# Patient Record
Sex: Male | Born: 1970 | Race: White | Hispanic: No | Marital: Married | Smoking: Never smoker
Health system: Southern US, Community
[De-identification: ages and names within clinical notes are randomized; demographics above are authoritative.]

## PROBLEM LIST (undated history)

## (undated) DIAGNOSIS — F319 Bipolar disorder, unspecified: Secondary | ICD-10-CM

## (undated) DIAGNOSIS — F429 Obsessive-compulsive disorder, unspecified: Secondary | ICD-10-CM

## (undated) DIAGNOSIS — F329 Major depressive disorder, single episode, unspecified: Secondary | ICD-10-CM

## (undated) DIAGNOSIS — I10 Essential (primary) hypertension: Secondary | ICD-10-CM

## (undated) DIAGNOSIS — K819 Cholecystitis, unspecified: Secondary | ICD-10-CM

## (undated) DIAGNOSIS — F331 Major depressive disorder, recurrent, moderate: Secondary | ICD-10-CM

## (undated) DIAGNOSIS — F419 Anxiety disorder, unspecified: Secondary | ICD-10-CM

## (undated) DIAGNOSIS — G4700 Insomnia, unspecified: Secondary | ICD-10-CM

## (undated) HISTORY — DX: Cholecystitis, unspecified: K81.9

## (undated) HISTORY — DX: Obsessive-compulsive disorder, unspecified: F42.9

## (undated) HISTORY — DX: Essential (primary) hypertension: I10

## (undated) HISTORY — DX: Insomnia, unspecified: G47.00

## (undated) HISTORY — DX: Major depressive disorder, single episode, unspecified: F32.9

## (undated) HISTORY — DX: Anxiety disorder, unspecified: F41.9

## (undated) HISTORY — DX: Major depressive disorder, recurrent, moderate: F33.1

## (undated) HISTORY — PX: VASECTOMY: SHX75

## (undated) HISTORY — PX: VASECTOMY REVERSAL: SHX243

## (undated) HISTORY — DX: Bipolar disorder, unspecified: F31.9

---

## 2005-11-09 ENCOUNTER — Ambulatory Visit: Payer: Self-pay | Admitting: Chiropractic Medicine

## 2013-07-16 ENCOUNTER — Ambulatory Visit: Payer: Self-pay | Admitting: Chiropractic Medicine

## 2014-08-27 ENCOUNTER — Ambulatory Visit: Payer: Self-pay | Admitting: Orthopedic Surgery

## 2015-12-01 DIAGNOSIS — F329 Major depressive disorder, single episode, unspecified: Secondary | ICD-10-CM | POA: Insufficient documentation

## 2015-12-01 DIAGNOSIS — F419 Anxiety disorder, unspecified: Secondary | ICD-10-CM

## 2015-12-01 DIAGNOSIS — F32A Depression, unspecified: Secondary | ICD-10-CM

## 2015-12-01 HISTORY — DX: Anxiety disorder, unspecified: F41.9

## 2015-12-01 HISTORY — DX: Depression, unspecified: F32.A

## 2015-12-24 ENCOUNTER — Encounter: Payer: Self-pay | Admitting: Psychiatry

## 2015-12-24 ENCOUNTER — Ambulatory Visit (INDEPENDENT_AMBULATORY_CARE_PROVIDER_SITE_OTHER): Payer: BC Managed Care – PPO | Admitting: Psychiatry

## 2015-12-24 NOTE — Progress Notes (Signed)
Psychiatric Initial Adult Assessment   Patient Identification: Gleb Mcguire MRN:  [id number] Date of Evaluation:  12/24/2015 Referral Source: Dr.Hedricks Chief Complaint:   Chief Complaint    Establish Care; Anxiety; Depression; Panic Attack; Fatigue; Stress     Visit Diagnosis: No diagnosis found. Diagnosis:   Patient Active Problem List   Diagnosis Date Noted  . Anxiety and depression [F41.8] 12/01/2015   History of Present Illness:  Patient is a 45 year old Caucasian male who presents today for an evaluation of his depression and treatment. Patient states she was referred by his primary care physician Dr. Robby Sermon. Patient was seen previously by Dr. Genevieve Norlander for his depression. Currently patient reports being quite depressed and being at a 2 on a scale of 1-10 with 10 being his best mood and one the lowest. He reports he is currently taking Wellbutrin 150 mg XL twice daily, Seroquel 400 mg daily, paroxetine 20 mg daily and Ambien 10 mg at bedtime to sleep. States that his depression has not improved on these medications. Reports being on this regimen since October of last year. States that he is having trouble sleeping and feeling quite depressed. He feels very fatigued and endorsing poor appetite. He reports having a poor appetite but also reports having gained 30 pounds since last June. He denies any suicidal thoughts. However states that he has recurrent thoughts of death. Patient has never attempted suicide and has never been hospitalized psychiatrically. Denies manic symptoms. He denies any psychotic symptoms. Denies use of drugs or alcohol. 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 States that he really enjoys his job. States that he does have social anxiety outside of his job. He is also endorsing obsessive-compulsive symptoms. States that he can't stop his mind racing and he has continuous thoughts all the time. He is endorsing some counting  behaviors and some other compulsive rituals. Patient has been on multiple medications in the past and states that he cannot remember how they work for him. States that he feels like his brain has been in a fog and his unable to remember things.   Associated Signs/Symptoms: Depression Symptoms:  depressed mood, anhedonia, insomnia, psychomotor agitation, fatigue, feelings of worthlessness/guilt, difficulty concentrating, impaired memory, anxiety, panic attacks, disturbed sleep, weight gain, (Hypo) Manic Symptoms:  denies Anxiety Symptoms:  Excessive Worry, Obsessive Compulsive Symptoms:   Counting,, Social Anxiety, Psychotic Symptoms:  denies PTSD Symptoms: Negative  Past Medical History:  Past Medical History  Diagnosis Date  . Bipolar disorder [facility])     Past Surgical History  Procedure Laterality Date  . Vasectomy     Family History:  Family History  Problem Relation Age of Onset  . Anxiety disorder Mother   . Depression Mother   . Alcohol abuse Father   . Anxiety disorder Sister   . Depression Sister    Social History:   Social History   Social History  . Marital Status: Divorced    Spouse Name: N/A  . Number of Children: N/A  . Years of Education: N/A   Social History Main Topics  . Smoking status: Never Smoker   . Smokeless tobacco: Never Used  . Alcohol Use: 13.8 oz/week    0 Standard drinks or equivalent, 2 Glasses of wine, 14 Cans of beer, 7 Shots of liquor per week  . Drug Use: No  . Sexual Activity: Yes    Birth Control/ Protection: None   Other Topics Concern  . None  Social History Narrative  . None   Additional Social History:   Musculoskeletal: Strength & Muscle Tone: within normal limits Gait & Station: normal Patient leans: N/A  Psychiatric Specialty Exam: HPI  ROS  Blood pressure 132/88, pulse 121, temperature 98.6 F (37 C), temperature source Tympanic, height 5' 11.5" (1.816 m), weight 185 lb 3.2 oz (84.006 kg), SpO2 98  %.Body mass index is 25.47 kg/(m^2).  General Appearance: Casual  Eye Contact:  Fair  Speech:  Clear and Coherent  Volume:  Normal  Mood:  Anxious, Depressed, Dysphoric, Hopeless and Worthless  Affect:  Constricted and Depressed  Thought Process:  Coherent  Orientation:  Full (Time, Place, and Person)  Thought Content:  Rumination  Suicidal Thoughts:  No  Homicidal Thoughts:  No  Memory:  Immediate;   Fair Recent;   Fair Remote;   Fair  Judgement:  Fair  Insight:  Present  Psychomotor Activity:  Normal  Concentration:  Fair  Recall:  [employer]  Fund of Knowledge:Fair  Language: Fair  Akathisia:  No  Handed:  Right  AIMS (if indicated):  na  Assets:  Communication Skills Desire for Improvement Housing Physical Health Social Support Vocational/Educational  ADL's:  Intact  Cognition: WNL  Sleep:  disturbed   Is the patient at risk to self?  No. Has the patient been a risk to self in the past 6 months?  No. Has the patient been a risk to self within the distant past?  No. Is the patient a risk to others?  No. Has the patient been a risk to others in the past 6 months?  No. Has the patient been a risk to others within the distant past?  No.  Allergies:  No Known Allergies Current Medications: Current Outpatient Prescriptions  Medication Sig Dispense Refill  . ALPRAZolam (XANAX) 1 MG tablet     . buPROPion (WELLBUTRIN XL) 150 MG 24 hr tablet Take 150 mg by mouth 2 (two) times daily.    . cyclobenzaprine (FLEXERIL) 10 MG tablet Take by mouth.    . cyproheptadine (PERIACTIN) 4 MG tablet TK 1 T PO QD  1  . meloxicam (MOBIC) 15 MG tablet Take 15 mg by mouth daily.    . QUEtiapine (SEROQUEL XR) 400 MG 24 hr tablet Take 400 mg by mouth daily.  1  . zolpidem (AMBIEN) 10 MG tablet Take 10 mg by mouth. Take 1/2 tablet     No current facility-administered medications for this visit.    Previous Psychotropic Medications: Yes Abilify- had anxiety, Lithium- legs were weak, Depakote,  Trintellix, Duloxetine, Mirtazapine 30mg   Substance Abuse History in the last 12 months:  No.  Consequences of Substance Abuse: Negative  Medical Decision Making:  Review of Psycho-Social Stressors (1), Review and summation of old records (2), Established Problem, Worsening (2) and Review of New Medication or Change in Dosage (2)  Treatment Plan Summary: Medication management  Major Depressive Disorder  Decrease Wellbutrin XL to 150mg  po qam. Decrease seroquel to 200mg  po qhs after 1 week. Continue Paxil at 20mg  poq d. Encouraged daily exercise and healthy diet.  Patient [facility]   Insomnia Take Ambien 10mg  po qhs.   Prerna Harold 3/23/20171:19 PM

## 2016-01-13 ENCOUNTER — Ambulatory Visit (INDEPENDENT_AMBULATORY_CARE_PROVIDER_SITE_OTHER): Payer: BC Managed Care – PPO | Admitting: Psychiatry

## 2016-01-13 ENCOUNTER — Encounter: Payer: Self-pay | Admitting: Psychiatry

## 2016-01-13 VITALS — BP 138/88 | HR 132 | Temp 98.3°F | Ht 71.5 in | Wt 182.4 lb

## 2016-01-13 DIAGNOSIS — F429 Obsessive-compulsive disorder, unspecified: Secondary | ICD-10-CM

## 2016-01-13 DIAGNOSIS — F331 Major depressive disorder, recurrent, moderate: Secondary | ICD-10-CM | POA: Diagnosis not present

## 2016-01-13 HISTORY — DX: Obsessive-compulsive disorder, unspecified: F42.9

## 2016-01-13 HISTORY — DX: Major depressive disorder, recurrent, moderate: F33.1

## 2016-01-13 MED ORDER — PAROXETINE HCL 30 MG PO TABS: 30.0000 mg | ORAL_TABLET | Freq: Every day | ORAL | Status: DC

## 2016-01-13 MED ORDER — ZOLPIDEM TARTRATE 10 MG PO TABS: ORAL_TABLET | ORAL | Status: DC

## 2016-01-13 NOTE — Progress Notes (Signed)
Patient ID: Jeremy Boyer, male   DOB: Jan 03, 1971, 45 y.o.   MRN: [id number]  Psychiatric Progress Note  Patient Identification: Jeremy Boyer MRN:  [id number] Date of Evaluation:  01/13/2016 Chief Complaint:   Chief Complaint    Follow-up; Medication Refill; Anxiety     Visit Diagnosis:    ICD-9-CM ICD-10-CM   1. MDD (major depressive disorder), recurrent episode, moderate (HCC) 296.32 F33.1   2. OCD (obsessive compulsive disorder) 300.3 F42.9    Diagnosis:   Patient Active Problem List   Diagnosis Date Noted  . MDD (major depressive disorder), recurrent episode, moderate (HCC) [F33.1] 01/13/2016  . OCD (obsessive compulsive disorder) [F42.9] 01/13/2016  . Anxiety and depression [F41.8] 12/01/2015   History of Present Illness:  Patient is a 45 year old Caucasian male who presents today for a follow up of depression and treatment. He reports that he has been able to wean off the Wellbutrin completely. He also reports that his mostly remained off the Seroquel and just takes a quarter pill sometimes. Though patient presents with more animated affect than his previous appointment he continues to report that he is still depressed. Continues to have several obsessive-compulsive symptoms. He also reports poor sleep. States that the Ambien helps him but it's just half a tablet. States that he takes Xanax as prescribed by his primary care physician once every 2-3 days for anxiety. Denies any suicidal thoughts. He reports continuous thoughts of anxiety and with a doomsday approach of family getting sick or his house catching on fire. He also reports having thoughts of not being good enough and states this has been a feeling that he has had throughout his childhood.  Reports is enjoying his spring break this week. Status states that he has wife are trying to have a baby and he has a great relationship with his wife.    Past Medical History:  Past Medical History  Diagnosis Date  .  Bipolar disorder [facility])     Past Surgical History  Procedure Laterality Date  . Vasectomy     Family History:  Family History  Problem Relation Age of Onset  . Anxiety disorder Mother   . Depression Mother   . Alcohol abuse Father   . Anxiety disorder Sister   . Depression Sister    Social History:   Social History   Social History  . Marital Status: Divorced    Spouse Name: N/A  . Number of Children: N/A  . Years of Education: N/A   Social History Main Topics  . Smoking status: Never Smoker   . Smokeless tobacco: Never Used  . Alcohol Use: 13.8 oz/week    0 Standard drinks or equivalent, 2 Glasses of wine, 14 Cans of beer, 7 Shots of liquor per week  . Drug Use: No  . Sexual Activity: Yes    Birth Control/ Protection: None   Other Topics Concern  . None   Social History Narrative   Additional Social History:   Musculoskeletal: Strength & Muscle Tone: within normal limits Gait & Station: normal Patient leans: N/A  Psychiatric Specialty Exam: Anxiety      ROS  Blood pressure 138/88, pulse 132, temperature 98.3 F (36.8 C), temperature source Tympanic, height 5' 11.5" (1.816 m), weight 182 lb 6.4 oz (82.736 kg), SpO2 96 %.Body mass index is 25.09 kg/(m^2).  General Appearance: Casual  Eye Contact:  Fair  Speech:  Clear and Coherent  Volume:  Normal  Mood:  dysphoric  Affect:  improved  Thought  Process:  Coherent  Orientation:  Full (Time, Place, and Person)  Thought Content:  Rumination  Suicidal Thoughts:  No  Homicidal Thoughts:  No  Memory:  Immediate;   Fair Recent;   Fair Remote;   Fair  Judgement:  Fair  Insight:  Present  Psychomotor Activity:  Normal  Concentration:  Fair  Recall:  [employer]  Fund of Knowledge:Fair  Language: Fair  Akathisia:  No  Handed:  Right  AIMS (if indicated):  na  Assets:  Communication Skills Desire for Improvement Housing Physical Health Social Support Vocational/Educational  ADL's:  Intact  Cognition: WNL   Sleep:  Not sleeping well   Is the patient at risk to self?  No. Has the patient been a risk to self in the past 6 months?  No. Has the patient been a risk to self within the distant past?  No. Is the patient a risk to others?  No. Has the patient been a risk to others in the past 6 months?  No. Has the patient been a risk to others within the distant past?  No.  Allergies:  No Known Allergies Current Medications: Current Outpatient Prescriptions  Medication Sig Dispense Refill  . ALPRAZolam (XANAX) 1 MG tablet     . cyclobenzaprine (FLEXERIL) 10 MG tablet Take by mouth.    . cyproheptadine (PERIACTIN) 4 MG tablet TK 1 T PO QD  1  . meloxicam (MOBIC) 15 MG tablet Take 15 mg by mouth daily.    Marland Kitchen. zolpidem (AMBIEN) 10 MG tablet Take 1 tablet at bedtime to help with insomnia 30 tablet 1  . PARoxetine (PAXIL) 30 MG tablet Take 1 tablet (30 mg total) by mouth daily. 30 tablet 2   No current facility-administered medications for this visit.    Previous Psychotropic Medications: Yes Abilify- had anxiety, Lithium- legs were weak, Depakote, Trintellix, Duloxetine, Mirtazapine 30mg   Substance Abuse History in the last 12 months:  No.  Consequences of Substance Abuse: Negative  Medical Decision Making:  Review of Psycho-Social Stressors (1), Review and summation of old records (2), Established Problem, Worsening (2) and Review of New Medication or Change in Dosage (2)  Treatment Plan Summary: Medication management  Major Depressive Disorder  Increase Paxil to 30mg  poq d. Encouraged daily exercise and healthy diet. Continue therapy.  OCD Same as above  Insomnia Take Ambien 10mg  po qhs.  RTC in 1 month. Call before if necessary.   Gwyn Hieronymus 4/12/20172:19 PM

## 2016-02-11 ENCOUNTER — Encounter: Payer: Self-pay | Admitting: Psychiatry

## 2016-02-11 ENCOUNTER — Ambulatory Visit (INDEPENDENT_AMBULATORY_CARE_PROVIDER_SITE_OTHER): Payer: BC Managed Care – PPO | Admitting: Psychiatry

## 2016-02-11 VITALS — BP 130/98 | HR 94 | Temp 98.2°F | Ht 71.5 in | Wt 186.2 lb

## 2016-02-11 DIAGNOSIS — F331 Major depressive disorder, recurrent, moderate: Secondary | ICD-10-CM | POA: Diagnosis not present

## 2016-02-11 DIAGNOSIS — F429 Obsessive-compulsive disorder, unspecified: Secondary | ICD-10-CM

## 2016-02-11 MED ORDER — SERTRALINE HCL 25 MG PO TABS: 25.0000 mg | ORAL_TABLET | Freq: Every day | ORAL | Status: DC

## 2016-02-11 NOTE — Progress Notes (Signed)
Patient ID: Jeremy Boyer Cohron, male   DOB: 03/18/1971, 45 y.o.   MRN: [id number]  Psychiatric Progress Note  Patient Identification: Jeremy Boyer Stebner MRN:  [id number] Date of Evaluation:  02/11/2016 Chief Complaint:    Visit Diagnosis:    ICD-9-CM ICD-10-CM   1. MDD (major depressive disorder), recurrent episode, moderate (HCC) 296.32 F33.1   2. OCD (obsessive compulsive disorder) 300.3 F42.9    Diagnosis:   Patient Active Problem List   Diagnosis Date Noted  . MDD (major depressive disorder), recurrent episode, moderate (HCC) [F33.1] 01/13/2016  . OCD (obsessive compulsive disorder) [F42.9] 01/13/2016  . Anxiety and depression [F41.8] 12/01/2015   History of Present Illness:  Patient is a 45 year old Caucasian male who presents today for a follow up of depression and treatment. Patient states he was able to tolerate the Paxil at 30mg  , though he thinks he may be more depressed. His motivation levels are low, has been napping more. Reports fair sleep and appetite.  Continues to have several obsessive-compulsive symptoms, has not seen any improvement or worsening. States he is working hard in therapy on his anxiety . 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 Denies any suicidal thoughts.   Past Medical History:  Past Medical History  Diagnosis Date  . Bipolar disorder [facility](HCC)     Past Surgical History  Procedure Laterality Date  . Vasectomy     Family History:  Family History  Problem Relation Age of Onset  . Anxiety disorder Mother   . Depression Mother   . Alcohol abuse Father   . Anxiety disorder Sister   . Depression Sister    Social History:   Social History   Social History  . Marital Status: Divorced    Spouse Name: N/A  . Number of Children: N/A  . Years of Education: N/A   Social History Main Topics  . Smoking status: Never Smoker   . Smokeless tobacco: Never Used  . Alcohol Use:  13.8 oz/week    0 Standard drinks or equivalent, 2 Glasses of wine, 14 Cans of beer, 7 Shots of liquor per week  . Drug Use: No  . Sexual Activity: Yes    Birth Control/ Protection: None   Other Topics Concern  . Not on file   Social History Narrative   Additional Social History:   Musculoskeletal: Strength & Muscle Tone: within normal limits Gait & Station: normal Patient leans: N/A  Psychiatric Specialty Exam: Anxiety      ROS  There were no vitals taken for this visit.There is no weight on file to calculate BMI.  General Appearance: Casual  Eye Contact:  Fair  Speech:  Clear and Coherent  Volume:  Normal  Mood:  dysphoric  Affect:  improved  Thought Process:  Coherent  Orientation:  Full (Time, Place, and Person)  Thought Content:  Rumination  Suicidal Thoughts:  No  Homicidal Thoughts:  No  Memory:  Immediate;   Fair Recent;   Fair Remote;   Fair  Judgement:  Fair  Insight:  Present  Psychomotor Activity:  Normal  Concentration:  Fair  Recall:  [employer]Fair  Fund of Knowledge:Fair  Language: Fair  Akathisia:  No  Handed:  Right  AIMS (if indicated):  na  Assets:  Communication Skills Desire for Improvement Housing Physical Health Social Support Vocational/Educational  ADL's:  Intact  Cognition: WNL  Sleep:  Not sleeping well  Is the patient at risk to self?  No. Has the patient been a risk to self in the past 6 months?  No. Has the patient been a risk to self within the distant past?  No. Is the patient a risk to others?  No. Has the patient been a risk to others in the past 6 months?  No. Has the patient been a risk to others within the distant past?  No.  Allergies:  No Known Allergies Current Medications: Current Outpatient Prescriptions  Medication Sig Dispense Refill  . ALPRAZolam (XANAX) 1 MG tablet     . cyclobenzaprine (FLEXERIL) 10 MG tablet Take by mouth.    . cyproheptadine (PERIACTIN) 4 MG tablet TK 1 T PO QD  1  . meloxicam (MOBIC)  15 MG tablet Take 15 mg by mouth daily.    Marland Kitchen PARoxetine (PAXIL) 30 MG tablet Take 1 tablet (30 mg total) by mouth daily. 30 tablet 2  . zolpidem (AMBIEN) 10 MG tablet Take 1 tablet at bedtime to help with insomnia 30 tablet 1   No current facility-administered medications for this visit.    Previous Psychotropic Medications: Yes Abilify- had anxiety, Lithium- legs were weak, Depakote, Trintellix, Duloxetine, Mirtazapine   Substance Abuse History in the last 12 months:  No.  Consequences of Substance Abuse: Negative  Medical Decision Making:  Review of Psycho-Social Stressors (1), Review and summation of old records (2), Established Problem, Worsening (2) and Review of New Medication or Change in Dosage (2)  Treatment Plan Summary: Medication management  Major Depressive Disorder  Decrease Paxil to  poq d for 2 weeks and then decrease to . Start Zoloft at  po qd. Encouraged daily exercise and healthy diet. Continue therapy.  OCD Same as above  Insomnia Take Ambien  po qhs.  RTC in 1 month. Call before if necessary.   Quantina Dershem 5/11/20173:01 PM

## 2016-03-14 ENCOUNTER — Ambulatory Visit (INDEPENDENT_AMBULATORY_CARE_PROVIDER_SITE_OTHER): Payer: BC Managed Care – PPO | Admitting: Psychiatry

## 2016-03-14 ENCOUNTER — Encounter: Payer: Self-pay | Admitting: Psychiatry

## 2016-03-14 VITALS — BP 150/80 | HR 70 | Temp 97.6°F | Ht 70.0 in | Wt 182.2 lb

## 2016-03-14 DIAGNOSIS — F331 Major depressive disorder, recurrent, moderate: Secondary | ICD-10-CM

## 2016-03-14 DIAGNOSIS — F429 Obsessive-compulsive disorder, unspecified: Secondary | ICD-10-CM

## 2016-03-14 MED ORDER — ZOLPIDEM TARTRATE 10 MG PO TABS: ORAL_TABLET | ORAL | Status: DC

## 2016-03-14 MED ORDER — SERTRALINE HCL 50 MG PO TABS: 50.0000 mg | ORAL_TABLET | Freq: Every day | ORAL | Status: DC

## 2016-03-14 NOTE — Progress Notes (Signed)
Patient ID: Nathanial Arrighi, male   DOB: 1971/07/07, 45 y.o.   MRN: [id number]   Psychiatric Progress Note  Patient Identification: Ritik Stavola MRN:  [id number] Date of Evaluation:  03/14/2016 Chief Complaint:   Chief Complaint    Depression; Medication Refill     Visit Diagnosis:    ICD-9-CM ICD-10-CM   1. MDD (major depressive disorder), recurrent episode, moderate (HCC) 296.32 F33.1   2. OCD (obsessive compulsive disorder) 300.3 F42.9    Diagnosis:   Patient Active Problem List   Diagnosis Date Noted  . MDD (major depressive disorder), recurrent episode, moderate (HCC) [F33.1] 01/13/2016  . OCD (obsessive compulsive disorder) [F42.9] 01/13/2016  . Anxiety and depression [F41.8] 12/01/2015   History of Present Illness:  Patient is a 45 year old Caucasian male who presents today for a follow up of depression and treatment. Patient states he was able to tolerate the zoloft at . He He reports that he is completely off the Paxil currently. States that he did not have any discontinuation symptoms for the most part. He last took Paxil 10 mg last Friday. States that he did have headaches last week and has some wooziness but that's better now. States he had some trouble sleeping last week for 3-4 nights but did sleep quite well last night. 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 States that he feels a bit more relaxed now that his depression and anxiety continue to be the same. He is tolerating the Zoloft without any side effects. He denies any suicidal thoughts but but reports that he cut on himself one night. He reveals a Presenter, broadcasting on his thigh that he cut into. 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 Other than this isolated episode denies any cutting behaviors or any suicidal thoughts.    Past Medical History:  Past Medical  History  Diagnosis Date  . Bipolar disorder [facility])     Past Surgical History  Procedure Laterality Date  . Vasectomy     Family History:  Family History  Problem Relation Age of Onset  . Anxiety disorder Mother   . Depression Mother   . Alcohol abuse Father   . Anxiety disorder Sister   . Depression Sister    Social History:   Social History   Social History  . Marital Status: Divorced    Spouse Name: N/A  . Number of Children: N/A  . Years of Education: N/A   Social History Main Topics  . Smoking status: Never Smoker   . Smokeless tobacco: Never Used  . Alcohol Use: 13.8 oz/week    0 Standard drinks or equivalent, 2 Glasses of wine, 14 Cans of beer, 7 Shots of liquor per week  . Drug Use: No  . Sexual Activity: Yes    Birth Control/ Protection: None   Other Topics Concern  . None   Social History Narrative   Additional Social History:   Musculoskeletal: Strength & Muscle Tone: within normal limits Gait & Station: normal Patient leans: N/A  Psychiatric Specialty Exam: Depression        Past medical history includes anxiety.   Anxiety      Review of Systems  Psychiatric/Behavioral: Positive for depression.    Blood pressure 150/80, pulse 70, temperature 97.6 F (36.4 C), height  (1.778 m), weight  182 lb 3.2 oz (82.645 kg), SpO2 92 %.Body mass index is 26.14 kg/(m^2).  General Appearance: Casual  Eye Contact:  Fair  Speech:  Clear and Coherent  Volume:  Normal  Mood:  dysphoric  Affect:  improved  Thought Process:  Coherent  Orientation:  Full (Time, Place, and Person)  Thought Content:  Rumination  Suicidal Thoughts:  No  Homicidal Thoughts:  No  Memory:  Immediate;   Fair Recent;   Fair Remote;   Fair  Judgement:  Fair  Insight:  Present  Psychomotor Activity:  Normal  Concentration:  Fair  Recall:  [employer]  Fund of Knowledge:Fair  Language: Fair  Akathisia:  No  Handed:  Right  AIMS (if indicated):  na  Assets:  Communication  Skills Desire for Improvement Housing Physical Health Social Support Vocational/Educational  ADL's:  Intact  Cognition: WNL  Sleep:  Not sleeping well   Is the patient at risk to self?  No. Has the patient been a risk to self in the past 6 months?  No. Has the patient been a risk to self within the distant past?  No. Is the patient a risk to others?  No. Has the patient been a risk to others in the past 6 months?  No. Has the patient been a risk to others within the distant past?  No.  Allergies:  No Known Allergies Current Medications: Current Outpatient Prescriptions  Medication Sig Dispense Refill  . ALPRAZolam (XANAX) 1 MG tablet     . cyclobenzaprine (FLEXERIL) 10 MG tablet Take by mouth.    . cyproheptadine (PERIACTIN) 4 MG tablet TK 1 T PO QD  1  . meloxicam (MOBIC) 15 MG tablet Take 15 mg by mouth daily.    . sertraline (ZOLOFT) 25 MG tablet Take 1 tablet (25 mg total) by mouth daily. 30 tablet 1  . zolpidem (AMBIEN) 10 MG tablet Take 1 tablet at bedtime to help with insomnia 30 tablet 1   No current facility-administered medications for this visit.    Previous Psychotropic Medications: Yes Abilify- had anxiety, Lithium- legs were weak, Depakote, Trintellix, Duloxetine, Mirtazapine 30mg   Substance Abuse History in the last 12 months:  No.  Consequences of Substance Abuse: Negative  Medical Decision Making:  Review of Psycho-Social Stressors (1), Review and summation of old records (2), Established Problem, Worsening (2) and Review of New Medication or Change in Dosage (2)  Treatment Plan Summary: Medication management  Major Depressive Disorder  Increase Zoloft to 50 mg po qd, after 1 week if patient is able to tolerate the 50 mg he can increase to 100 mg. Encouraged daily exercise and healthy diet. Continue therapy.  OCD Same as above  Insomnia Take Ambien 10mg  po qhs.  RTC in 2 weeks. Call before if necessary.   Tahj Lindseth 6/12/20179:05 AM

## 2016-03-29 ENCOUNTER — Ambulatory Visit (INDEPENDENT_AMBULATORY_CARE_PROVIDER_SITE_OTHER): Payer: BC Managed Care – PPO | Admitting: Psychiatry

## 2016-03-29 ENCOUNTER — Encounter: Payer: Self-pay | Admitting: Psychiatry

## 2016-03-29 VITALS — BP 122/82 | HR 78 | Temp 97.9°F | Ht 70.0 in | Wt 180.2 lb

## 2016-03-29 DIAGNOSIS — F331 Major depressive disorder, recurrent, moderate: Secondary | ICD-10-CM

## 2016-03-29 DIAGNOSIS — F429 Obsessive-compulsive disorder, unspecified: Secondary | ICD-10-CM | POA: Diagnosis not present

## 2016-03-29 MED ORDER — SERTRALINE HCL 100 MG PO TABS: 100.0000 mg | ORAL_TABLET | Freq: Every day | ORAL | Status: DC

## 2016-03-29 NOTE — Progress Notes (Signed)
Patient ID: Jeremy Boyer Aden, male   DOB: 1971-05-16, 45 y.o.   MRN: [id number]   Psychiatric Progress Note  Patient Identification: Jeremy Boyer Clifton MRN:  [id number] Date of Evaluation:  03/29/2016 Chief Complaint:   Chief Complaint    Follow-up; Medication Refill; Headache     Visit Diagnosis:    ICD-9-CM ICD-10-CM   1. MDD (major depressive disorder), recurrent episode, moderate (HCC) 296.32 F33.1   2. OCD (obsessive compulsive disorder) 300.3 F42.9    Diagnosis:   Patient Active Problem List   Diagnosis Date Noted  . MDD (major depressive disorder), recurrent episode, moderate (HCC) [F33.1] 01/13/2016  . OCD (obsessive compulsive disorder) [F42.9] 01/13/2016  . Anxiety and depression [F41.8] 12/01/2015   History of Present Illness:  Patient is a 45 year old Caucasian male who presents today for a follow up of depression and treatment. Patient states he was able to tolerate the zoloft to 50mg . States he is feeling better. His obsessive thoughts have improved. Not having as many negative thoughts. He has enjoyed spending time with his family. He is sleeping better and feels more rested. Denies any suicidal thoughts. States things feel less stressed, would like his mood to be more stable.  Past Medical History:  Past Medical History  Diagnosis Date  . Bipolar disorder [facility](HCC)     Past Surgical History  Procedure Laterality Date  . Vasectomy     Family History:  Family History  Problem Relation Age of Onset  . Anxiety disorder Mother   . Depression Mother   . Alcohol abuse Father   . Anxiety disorder Sister   . Depression Sister    Social History:   Social History   Social History  . Marital Status: Divorced    Spouse Name: N/A  . Number of Children: N/A  . Years of Education: N/A   Social History Main Topics  . Smoking status: Never Smoker   . Smokeless tobacco: Never Used  . Alcohol Use: 13.8 oz/week    0 Standard drinks or equivalent, 2 Glasses of wine,  14 Cans of beer, 7 Shots of liquor per week  . Drug Use: No  . Sexual Activity: Yes    Birth Control/ Protection: None   Other Topics Concern  . None   Social History Narrative   Additional Social History:   Musculoskeletal: Strength & Muscle Tone: within normal limits Gait & Station: normal Patient leans: N/A  Psychiatric Specialty Exam: Headache   Depression        Associated symptoms include headaches.  Past medical history includes anxiety.   Anxiety      Review of Systems  Neurological: Positive for headaches.  Psychiatric/Behavioral: Negative for depression.    Blood pressure 122/82, pulse 78, temperature 97.9 F (36.6 C), temperature source Tympanic, height 5\' 10"  (1.778 m), weight 180 lb 3.2 oz (81.738 kg), SpO2 97 %.Body mass index is 25.86 kg/(m^2).  General Appearance: Casual  Eye Contact:  Fair  Speech:  Clear and Coherent  Volume:  Normal  Mood:  Much improved  Affect:  improved  Thought Process:  Coherent  Orientation:  Full (Time, Place, and Person)  Thought Content:  normal  Suicidal Thoughts:  No  Homicidal Thoughts:  No  Memory:  Immediate;   Fair Recent;   Fair Remote;   Fair  Judgement:  Fair  Insight:  Present  Psychomotor Activity:  Normal  Concentration:  Fair  Recall:  [employer]  Fund of Knowledge:Fair  Language: Fair  Akathisia:  No  Handed:  Right  AIMS (if indicated):  na  Assets:  Communication Skills Desire for Improvement Housing Physical Health Social Support Vocational/Educational  ADL's:  Intact  Cognition: WNL  Sleep:  Sleeping better   Is the patient at risk to self?  No. Has the patient been a risk to self in the past 6 months?  No. Has the patient been a risk to self within the distant past?  No. Is the patient a risk to others?  No. Has the patient been a risk to others in the past 6 months?  No. Has the patient been a risk to others within the distant past?  No.  Allergies:  No Known Allergies Current  Medications: Current Outpatient Prescriptions  Medication Sig Dispense Refill  . ALPRAZolam (XANAX) 1 MG tablet     . cyclobenzaprine (FLEXERIL) 10 MG tablet Take by mouth.    . cyproheptadine (PERIACTIN) 4 MG tablet TK 1 T PO QD  1  . meloxicam (MOBIC) 15 MG tablet Take 15 mg by mouth daily.    . sertraline (ZOLOFT) 50 MG tablet Take 1 tablet (50 mg total) by mouth daily. 30 tablet 1  . zolpidem (AMBIEN) 10 MG tablet Take 1 tablet at bedtime to help with insomnia 30 tablet 1   No current facility-administered medications for this visit.    Previous Psychotropic Medications: Yes Abilify- had anxiety, Lithium- legs were weak, Depakote, Trintellix, Duloxetine, Mirtazapine 30mg   Substance Abuse History in the last 12 months:  No.  Consequences of Substance Abuse: Negative  Medical Decision Making:  Review of Psycho-Social Stressors (1), Review and summation of old records (2), Established Problem, Worsening (2) and Review of New Medication or Change in Dosage (2)  Treatment Plan Summary: Medication management  Major Depressive Disorder  Increase Zoloft to 75 mg for one week and then to 100 mg daily. Encouraged daily exercise and healthy diet. Continue therapy.  OCD Same as above  Insomnia Take Ambien 10mg  po qhs.  RTC in 2 weeks. Call before if necessary.   Parth Mccormac 6/27/20171:42 PM

## 2016-04-13 ENCOUNTER — Ambulatory Visit (INDEPENDENT_AMBULATORY_CARE_PROVIDER_SITE_OTHER): Payer: BC Managed Care – PPO | Admitting: Psychiatry

## 2016-04-13 DIAGNOSIS — F331 Major depressive disorder, recurrent, moderate: Secondary | ICD-10-CM

## 2016-04-13 DIAGNOSIS — F429 Obsessive-compulsive disorder, unspecified: Secondary | ICD-10-CM | POA: Diagnosis not present

## 2016-04-13 MED ORDER — SERTRALINE HCL 100 MG PO TABS: 100.0000 mg | ORAL_TABLET | Freq: Every day | ORAL | Status: DC

## 2016-04-13 MED ORDER — ZOLPIDEM TARTRATE 10 MG PO TABS: ORAL_TABLET | ORAL | Status: DC

## 2016-04-13 NOTE — Progress Notes (Signed)
Patient ID: Jeremy Boyer, male   DOB: 07/30/1971, 45 y.o.   MRN: [id number]   Psychiatric Progress Note  Patient Identification: Jeremy Boyer MRN:  [id number] Date of Evaluation:  04/13/2016 Chief Complaint:    Visit Diagnosis:    ICD-9-CM ICD-10-CM   1. MDD (major depressive disorder), recurrent episode, moderate (HCC) 296.32 F33.1   2. OCD (obsessive compulsive disorder) 300.3 F42.9    Diagnosis:   Patient Active Problem List   Diagnosis Date Noted  . MDD (major depressive disorder), recurrent episode, moderate (HCC) [F33.1] 01/13/2016  . OCD (obsessive compulsive disorder) [F42.9] 01/13/2016  . Anxiety and depression [F41.8] 12/01/2015   History of Present Illness:  Patient is a 45 year old Caucasian male who presents today for a follow up of depression and treatment. Patient states he was able to tolerate the zoloft to . States he is doing much better. States his sleep is more restful. He has more energy. His mood has improved and reports it has been at a 5-6 on a scale of 1-10 with 10 being his best mood. His OCD has slightly improved. Denies any suicidal thoughts.   Past Medical History:  Past Medical History  Diagnosis Date  . Bipolar disorder [facility])     Past Surgical History  Procedure Laterality Date  . Vasectomy     Family History:  Family History  Problem Relation Age of Onset  . Anxiety disorder Mother   . Depression Mother   . Alcohol abuse Father   . Anxiety disorder Sister   . Depression Sister    Social History:   Social History   Social History  . Marital Status: Divorced    Spouse Name: N/A  . Number of Children: N/A  . Years of Education: N/A   Social History Main Topics  . Smoking status: Never Smoker   . Smokeless tobacco: Never Used  . Alcohol Use: 13.8 oz/week    0 Standard drinks or equivalent, 2 Glasses of wine, 14 Cans of beer, 7 Shots of liquor per week  . Drug Use: No  . Sexual Activity: Yes    Birth Control/  Protection: None   Other Topics Concern  . Not on file   Social History Narrative   Additional Social History:   Musculoskeletal: Strength & Muscle Tone: within normal limits Gait & Station: normal Patient leans: N/A  Psychiatric Specialty Exam: Headache   Depression        Associated symptoms include headaches.  Past medical history includes anxiety.   Anxiety      Review of Systems  Neurological: Positive for headaches.  Psychiatric/Behavioral: Negative for depression.    There were no vitals taken for this visit.There is no weight on file to calculate BMI.  General Appearance: Casual  Eye Contact:  Fair  Speech:  Clear and Coherent  Volume:  Normal  Mood:  Much improved  Affect:  improved  Thought Process:  Coherent  Orientation:  Full (Time, Place, and Person)  Thought Content:  normal  Suicidal Thoughts:  No  Homicidal Thoughts:  No  Memory:  Immediate;   Fair Recent;   Fair Remote;   Fair  Judgement:  Fair  Insight:  Present  Psychomotor Activity:  Normal  Concentration:  Fair  Recall:  [employer] of Knowledge:Fair  Language: Fair  Akathisia:  No  Handed:  Right  AIMS (if indicated):  na  Assets:  Communication Skills Desire for Improvement Housing Physical Health Social Support Vocational/Educational  ADL's:  Intact  Cognition: WNL  Sleep:  Sleeping better   Is the patient at risk to self?  No. Has the patient been a risk to self in the past 6 months?  No. Has the patient been a risk to self within the distant past?  No. Is the patient a risk to others?  No. Has the patient been a risk to others in the past 6 months?  No. Has the patient been a risk to others within the distant past?  No.  Allergies:  No Known Allergies Current Medications: Current Outpatient Prescriptions  Medication Sig Dispense Refill  . ALPRAZolam (XANAX) 1 MG tablet     . cyclobenzaprine (FLEXERIL) 10 MG tablet Take by mouth.    . cyproheptadine (PERIACTIN) 4 MG  tablet TK 1 T PO QD  1  . meloxicam (MOBIC) 15 MG tablet Take 15 mg by mouth daily.    . sertraline (ZOLOFT) 100 MG tablet Take 1 tablet (100 mg total) by mouth daily. 30 tablet 1  . zolpidem (AMBIEN) 10 MG tablet Take 1 tablet at bedtime to help with insomnia 30 tablet 1   No current facility-administered medications for this visit.    Previous Psychotropic Medications: Yes Abilify- had anxiety, Lithium- legs were weak, Depakote, Trintellix, Duloxetine, Mirtazapine 30mg   Substance Abuse History in the last 12 months:  No.  Consequences of Substance Abuse: Negative  Medical Decision Making:  Review of Psycho-Social Stressors (1), Review and summation of old records (2), Established Problem, Worsening (2) and Review of New Medication or Change in Dosage (2)  Treatment Plan Summary: Medication management  Major Depressive Disorder  Continue zoloft at 100 mg daily. Encouraged daily exercise and healthy diet. Continue therapy.  OCD Same as above  Insomnia Take Ambien 10mg  po qhs.  RTC in 2 months. Call before if necessary.   Hampton Wixom 7/12/20172:31 PM

## 2016-07-12 ENCOUNTER — Ambulatory Visit (INDEPENDENT_AMBULATORY_CARE_PROVIDER_SITE_OTHER): Payer: BC Managed Care – PPO | Admitting: Psychiatry

## 2016-07-12 ENCOUNTER — Encounter: Payer: Self-pay | Admitting: Psychiatry

## 2016-07-12 DIAGNOSIS — F329 Major depressive disorder, single episode, unspecified: Secondary | ICD-10-CM | POA: Diagnosis not present

## 2016-07-12 MED ORDER — ZOLPIDEM TARTRATE 10 MG PO TABS: ORAL_TABLET | ORAL | 1 refills | Status: DC

## 2016-07-12 MED ORDER — SERTRALINE HCL 100 MG PO TABS: 150.0000 mg | ORAL_TABLET | Freq: Every day | ORAL | 1 refills | Status: DC

## 2016-07-12 NOTE — Progress Notes (Signed)
Patient ID: Jeremy Boyer, male   DOB: Apr 19, 1971, 45 y.o.   MRN: [id number]   Psychiatric Progress Note  Patient Identification: Jeremy OvermanCullen Grant Boyer MRN:  [id number] Date of Evaluation:  07/12/2016 Chief Complaint:   Chief Complaint    Follow-up; Medication Refill     Visit Diagnosis:  No diagnosis found. Diagnosis:   Patient Active Problem List   Diagnosis Date Noted  . MDD (major depressive disorder), recurrent episode, moderate (HCC) [F33.1] 01/13/2016  . OCD (obsessive compulsive disorder) [F42.9] 01/13/2016  . Anxiety and depression [F41.8] 12/01/2015   History of Present Illness:  Patient is a 45 year old Caucasian male who presents today for a follow up of depression and treatment. Patient reports that more recently his been quite stressed given his schedule at school. Reports that he has several meetings every day and continues classes. He also reports stress with his daughter who is [id number] who has multiple psychiatric issues and has been struggling. Reports his sleeping well and eating well. He recently got a shot for his back pain and that has been helpful. He is also seeing a therapist regularly. States that his therapist thinks he may have some symptoms of attention deficit disorder. Denies any suicidal thoughts.    Past Medical History:  Past Medical History:  Diagnosis Date  . Bipolar disorder [facility](HCC)     Past Surgical History:  Procedure Laterality Date  . VASECTOMY     Family History:  Family History  Problem Relation Age of Onset  . Anxiety disorder Mother   . Depression Mother   . Alcohol abuse Father   . Anxiety disorder Sister   . Depression Sister    Social History:   Social History   Social History  . Marital status: Divorced    Spouse name: N/A  . Number of children: N/A  . Years of education: N/A   Social History Main Topics  . Smoking status: Never Smoker  . Smokeless tobacco: Never Used  . Alcohol use 13.8 oz/week    2 Glasses of wine,  14 Cans of beer, 7 Shots of liquor per week  . Drug use: No  . Sexual activity: Yes    Birth control/ protection: None   Other Topics Concern  . None   Social History Narrative  . None   Additional Social History:   Musculoskeletal: Strength & Muscle Tone: within normal limits Gait & Station: normal Patient leans: N/A  Psychiatric Specialty Exam: Headache    Depression         Associated symptoms include headaches.  Past medical history includes anxiety.   Anxiety     Medication Refill  Associated symptoms include headaches.    Review of Systems  Neurological: Positive for headaches.  Psychiatric/Behavioral: Negative for depression.    Blood pressure 119/72, pulse 87, temperature 98.9 F (37.2 C), temperature source Oral, weight 182 lb 12.8 oz (82.9 kg).Body mass index is 26.23 kg/m.  General Appearance: Casual  Eye Contact:  Fair  Speech:  Clear and Coherent  Volume:  Normal  Mood: stressed, depressed  Affect:  improved  Thought Process:  Coherent  Orientation:  Full (Time, Place, and Person)  Thought Content:  normal  Suicidal Thoughts:  No  Homicidal Thoughts:  No  Memory:  Immediate;   Fair Recent;   Fair Remote;   Fair  Judgement:  Fair  Insight:  Present  Psychomotor Activity:  Normal  Concentration:  Fair  Recall:  [employer]  Fund of Knowledge:Fair  Language: Fair  Akathisia:  No  Handed:  Right  AIMS (if indicated):  na  Assets:  Communication Skills Desire for Improvement Housing Physical Health Social Support Vocational/Educational  ADL's:  Intact  Cognition: WNL  Sleep:  Sleeping better   Is the patient at risk to self?  No. Has the patient been a risk to self in the past 6 months?  No. Has the patient been a risk to self within the distant past?  No. Is the patient a risk to others?  No. Has the patient been a risk to others in the past 6 months?  No. Has the patient been a risk to others within the distant past?  No.  Allergies:   No Known Allergies Current Medications: Current Outpatient Prescriptions  Medication Sig Dispense Refill  . ALPRAZolam (XANAX) 1 MG tablet     . cyclobenzaprine (FLEXERIL) 10 MG tablet Take by mouth.    . cyproheptadine (PERIACTIN) 4 MG tablet TK 1 T PO QD  1  . losartan (COZAAR) 50 MG tablet Take by mouth.    . meloxicam (MOBIC) 15 MG tablet Take 15 mg by mouth daily.    . sertraline (ZOLOFT) 100 MG tablet Take 1 tablet (100 mg total) by mouth daily. 30 tablet 1  . zolpidem (AMBIEN) 10 MG tablet Take 1 tablet at bedtime to help with insomnia 30 tablet 1   No current facility-administered medications for this visit.     Previous Psychotropic Medications: Yes Abilify- had anxiety, Lithium- legs were weak, Depakote, Trintellix, Duloxetine, Mirtazapine 30mg   Substance Abuse History in the last 12 months:  No.  Consequences of Substance Abuse: Negative  Medical Decision Making:  Review of Psycho-Social Stressors (1), Review and summation of old records (2), Established Problem, Worsening (2) and Review of New Medication or Change in Dosage (2)  Treatment Plan Summary: Medication management  Major Depressive Disorder  Increase Zoloft to 150 mg once daily Encouraged daily exercise and healthy diet. Continue therapy.  OCD Same as above  Insomnia Take Ambien 10mg  po qhs.  RTC in 1 months. Call before if necessary.   Murle Otting 10/10/20173:32 PM

## 2016-07-27 ENCOUNTER — Other Ambulatory Visit: Payer: Self-pay

## 2016-08-09 ENCOUNTER — Ambulatory Visit (INDEPENDENT_AMBULATORY_CARE_PROVIDER_SITE_OTHER): Payer: BC Managed Care – PPO | Admitting: Psychiatry

## 2016-08-09 ENCOUNTER — Encounter: Payer: Self-pay | Admitting: Psychiatry

## 2016-08-09 DIAGNOSIS — F330 Major depressive disorder, recurrent, mild: Secondary | ICD-10-CM | POA: Diagnosis not present

## 2016-08-09 MED ORDER — SERTRALINE HCL 100 MG PO TABS: 200.0000 mg | ORAL_TABLET | Freq: Every day | ORAL | 1 refills | Status: DC

## 2016-08-09 NOTE — Progress Notes (Signed)
Patient ID: Jeremy OvermanCullen Grant Pann, male   DOB: [birth year], 45 y.o.   MRN: [id number]   Psychiatric Progress Note  Patient Identification: Jeremy Boyer MRN:  [id number] Date of Evaluation:  08/09/2016 Chief Complaint:   Chief Complaint    Depression     Visit Diagnosis:  No diagnosis found. Diagnosis:   Patient Active Problem List   Diagnosis Date Noted  . MDD (major depressive disorder), recurrent episode, moderate (HCC) [F33.1] 01/13/2016  . OCD (obsessive compulsive disorder) [F42.9] 01/13/2016  . Anxiety and depression [F41.8] 12/01/2015   History of Present Illness:  Patient is a 45 year old Caucasian male who presents today for a follow up of depression and treatment. Patient reports that he's been doing about the same. The increase in Zoloft 150 mg has not really helped him. States that the stress at school continues and his doctor was doing well but again had some trouble last week. He also reports that his son is about to be deployed and this has been somewhat stressful. His been sleeping okay but reports he has not been eating well and has not been exercising. Denies any suicidal thoughts.  Past Medical History:  Past Medical History:  Diagnosis Date  . Bipolar disorder [facility](HCC)     Past Surgical History:  Procedure Laterality Date  . VASECTOMY     Family History:  Family History  Problem Relation Age of Onset  . Anxiety disorder Mother   . Depression Mother   . Alcohol abuse Father   . Anxiety disorder Sister   . Depression Sister    Social History:   Social History   Social History  . Marital status: Divorced    Spouse name: N/A  . Number of children: N/A  . Years of education: N/A   Social History Main Topics  . Smoking status: Never Smoker  . Smokeless tobacco: Never Used  . Alcohol use 13.8 oz/week    2 Glasses of wine, 14 Cans of beer, 7 Shots of liquor per week  . Drug use: No  . Sexual activity: Yes    Birth control/ protection: None   Other  Topics Concern  . None   Social History Narrative  . None   Additional Social History:   Musculoskeletal: Strength & Muscle Tone: within normal limits Gait & Station: normal Patient leans: N/A  Psychiatric Specialty Exam: Medication Refill  Associated symptoms include headaches.  Headache    Depression         Associated symptoms include headaches.  Past medical history includes anxiety.   Anxiety       Review of Systems  Neurological: Positive for headaches.  Psychiatric/Behavioral: Negative for depression.    Blood pressure (!) 145/92, pulse 88, temperature 98.5 F (36.9 C), temperature source Tympanic, resp. rate 17, weight 184 lb (83.5 kg), SpO2 97 %.Body mass index is 26.4 kg/m.  General Appearance: Casual  Eye Contact:  Fair  Speech:  Clear and Coherent  Volume:  Normal  Mood: stressed  Affect:  Congruent   Thought Process:  Coherent  Orientation:  Full (Time, Place, and Person)  Thought Content:  normal  Suicidal Thoughts:  No  Homicidal Thoughts:  No  Memory:  Immediate;   Fair Recent;   Fair Remote;   Fair  Judgement:  Fair  Insight:  Present  Psychomotor Activity:  Normal  Concentration:  Fair  Recall:  [employer]  Fund of Knowledge:Fair  Language: Fair  Akathisia:  No  Handed:  Right  AIMS (if  indicated):  na  Assets:  Communication Skills Desire for Improvement Housing Physical Health Social Support Vocational/Educational  ADL's:  Intact  Cognition: WNL  Sleep:  Sleeping better   Is the patient at risk to self?  No. Has the patient been a risk to self in the past 6 months?  No. Has the patient been a risk to self within the distant past?  No. Is the patient a risk to others?  No. Has the patient been a risk to others in the past 6 months?  No. Has the patient been a risk to others within the distant past?  No.  Allergies:  No Known Allergies Current Medications: Current Outpatient Prescriptions  Medication Sig Dispense Refill  .  ALPRAZolam (XANAX) 1 MG tablet     . cyclobenzaprine (FLEXERIL) 10 MG tablet Take by mouth.    . losartan (COZAAR) 50 MG tablet Take by mouth.    . meloxicam (MOBIC) 15 MG tablet Take 15 mg by mouth daily.    . sertraline (ZOLOFT) 100 MG tablet Take 1.5 tablets (150 mg total) by mouth daily. 45 tablet 1  . zolpidem (AMBIEN) 10 MG tablet Take 1 tablet at bedtime to help with insomnia 30 tablet 1  . cyproheptadine (PERIACTIN) 4 MG tablet TK 1 T PO QD  1   No current facility-administered medications for this visit.     Previous Psychotropic Medications: Yes Abilify- had anxiety, Lithium- legs were weak, Depakote, Trintellix, Duloxetine, Mirtazapine 30mg   Substance Abuse History in the last 12 months:  No.  Consequences of Substance Abuse: Negative  Medical Decision Making:  Review of Psycho-Social Stressors (1), Review and summation of old records (2), Established Problem, Worsening (2) and Review of New Medication or Change in Dosage (2)  Treatment Plan Summary: Medication management  Major Depressive Disorder  Increase Zoloft to 200 mg once daily Encouraged daily exercise and healthy diet. Continue therapy.  OCD  Same as above  Insomnia Take Ambien 10mg  po qhs.  RTC in 1 months. Call before if necessary.   Genny Caulder 11/7/20173:02 PM

## 2016-09-13 ENCOUNTER — Ambulatory Visit (INDEPENDENT_AMBULATORY_CARE_PROVIDER_SITE_OTHER): Payer: BC Managed Care – PPO | Admitting: Psychiatry

## 2016-09-13 ENCOUNTER — Encounter: Payer: Self-pay | Admitting: Psychiatry

## 2016-09-13 VITALS — BP 139/93 | HR 96 | Temp 98.5°F | Wt 186.8 lb

## 2016-09-13 DIAGNOSIS — F429 Obsessive-compulsive disorder, unspecified: Secondary | ICD-10-CM

## 2016-09-13 DIAGNOSIS — F331 Major depressive disorder, recurrent, moderate: Secondary | ICD-10-CM | POA: Diagnosis not present

## 2016-09-13 MED ORDER — SERTRALINE HCL 100 MG PO TABS: 150.0000 mg | ORAL_TABLET | Freq: Every day | ORAL | 1 refills | Status: DC

## 2016-09-13 MED ORDER — ZOLPIDEM TARTRATE 10 MG PO TABS: ORAL_TABLET | ORAL | 1 refills | Status: DC

## 2016-09-13 NOTE — Progress Notes (Signed)
Patient ID: Jeremy Boyer [place], male   DOB: [birth year], 45 y.o.   MRN: [id number]   Psychiatric Progress Note  Patient Identification: Jeremy Boyer Demedeiros MRN:  [id number] Date of Evaluation:  09/13/2016 Chief Complaint:   Chief Complaint    Follow-up; Medication Refill     Visit Diagnosis:    ICD-9-CM ICD-10-CM   1. MDD (major depressive disorder), recurrent episode, moderate (HCC) 296.32 F33.1   2. Obsessive-compulsive disorder, unspecified type 300.3 F42.9    Diagnosis:   Patient Active Problem List   Diagnosis Date Noted  . MDD (major depressive disorder), recurrent episode, moderate (HCC) [F33.1] 01/13/2016  . OCD (obsessive compulsive disorder) [F42.9] 01/13/2016  . Anxiety and depression [F41.8] 12/01/2015   History of Present Illness:  Patient is a 45 year old Caucasian male who presents today for a follow up of depression and OCD. Patient reports that he's been doing about the same. The increase in Zoloft has not been helpful, he is more tired. 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 His daughter is having a difficult time and undergoing more testing. His eating has improved. Denies any suicidal thoughts.  Past Medical History:  Past Medical History:  Diagnosis Date  . Bipolar disorder [facility](HCC)     Past Surgical History:  Procedure Laterality Date  . VASECTOMY     Family History:  Family History  Problem Relation Age of Onset  . Anxiety disorder Mother   . Depression Mother   . Alcohol abuse Father   . Anxiety disorder Sister   . Depression Sister    Social History:   Social History   Social History  . Marital status: Divorced    Spouse name: N/A  . Number of children: N/A  . Years of education: N/A   Social History Main Topics  . Smoking status: Never Smoker  . Smokeless tobacco: Never Used  . Alcohol use 13.8 oz/week    2 Glasses of wine, 14 Cans of beer, 7 Shots of liquor per week  . Drug use: No  . Sexual activity: Yes    Birth  control/ protection: None   Other Topics Concern  . None   Social History Narrative  . None   Additional Social History:   Musculoskeletal: Strength & Muscle Tone: within normal limits Gait & Station: normal Patient leans: N/A  Psychiatric Specialty Exam: Medication Refill  Associated symptoms include headaches.  Headache    Depression         Associated symptoms include headaches.  Past medical history includes anxiety.   Anxiety       Review of Systems  Neurological: Positive for headaches.  Psychiatric/Behavioral: Negative for depression.    Blood pressure (!) 139/93, pulse 96, temperature 98.5 F (36.9 C), temperature source Oral, weight 186 lb 12.8 oz (84.7 kg).Body mass index is 26.8 kg/m.  General Appearance: Casual  Eye Contact:  Fair  Speech:  Clear and Coherent  Volume:  Normal  Mood: stressed  Affect:  Congruent   Thought Process:  Coherent  Orientation:  Full (Time, Place, and Person)  Thought Content:  normal  Suicidal Thoughts:  No  Homicidal Thoughts:  No  Memory:  Immediate;   Fair Recent;   Fair Remote;   Fair  Judgement:  Fair  Insight:  Present  Psychomotor Activity:  Normal  Concentration:  Fair  Recall:  [employer]  Fund of Knowledge:Fair  Language: Fair  Akathisia:  No  Handed:  Right  AIMS (if indicated):  na  Assets:  Communication Skills Desire for Improvement Housing Physical Health Social Support Vocational/Educational  ADL's:  Intact  Cognition: WNL  Sleep:  Sleeping better   Is the patient at risk to self?  No. Has the patient been a risk to self in the past 6 months?  No. Has the patient been a risk to self within the distant past?  No. Is the patient a risk to others?  No. Has the patient been a risk to others in the past 6 months?  No. Has the patient been a risk to others within the distant past?  No.  Allergies:  No Known Allergies Current Medications: Current Outpatient Prescriptions  Medication Sig Dispense  Refill  . ALPRAZolam (XANAX) 1 MG tablet     . cyclobenzaprine (FLEXERIL) 10 MG tablet Take by mouth.    . cyproheptadine (PERIACTIN) 4 MG tablet TK 1 T PO QD  1  . losartan (COZAAR) 50 MG tablet Take by mouth.    . meloxicam (MOBIC) 15 MG tablet Take 15 mg by mouth daily.    . sertraline (ZOLOFT) 100 MG tablet Take 2 tablets (200 mg total) by mouth daily. 60 tablet 1  . zolpidem (AMBIEN) 10 MG tablet Take 1 tablet at bedtime to help with insomnia 30 tablet 1   No current facility-administered medications for this visit.     Previous Psychotropic Medications: Yes Abilify- had anxiety, Lithium- legs were weak, Depakote, Trintellix, Duloxetine, Mirtazapine 30mg   Substance Abuse History in the last 12 months:  No.  Consequences of Substance Abuse: Negative  Medical Decision Making:  Review of Psycho-Social Stressors (1), Review and summation of old records (2), Established Problem, Worsening (2) and Review of New Medication or Change in Dosage (2)  Treatment Plan Summary: Medication management  Major Depressive Disorder  Decrease Zoloft to 150 mg daily Encouraged daily exercise and healthy diet. Continue therapy.  OCD  Same as above  Insomnia Take Ambien 10mg  po qhs.  RTC in 1 months. Call before if necessary.   Kaimana Neuzil 12/12/20173:10 PM

## 2016-10-24 ENCOUNTER — Emergency Department: Payer: BC Managed Care – PPO

## 2016-10-24 ENCOUNTER — Emergency Department
Admission: EM | Admit: 2016-10-24 | Discharge: 2016-10-24 | Disposition: A | Discharge status: Home / Self Care | Payer: BC Managed Care – PPO | Attending: Emergency Medicine | Admitting: Emergency Medicine

## 2016-10-24 ENCOUNTER — Encounter: Payer: Self-pay | Admitting: Emergency Medicine

## 2016-10-24 DIAGNOSIS — Z791 Long term (current) use of non-steroidal anti-inflammatories (NSAID): Secondary | ICD-10-CM | POA: Diagnosis not present

## 2016-10-24 DIAGNOSIS — J069 Acute upper respiratory infection, unspecified: Secondary | ICD-10-CM

## 2016-10-24 DIAGNOSIS — R05 Cough: Secondary | ICD-10-CM | POA: Diagnosis present

## 2016-10-24 LAB — CBC
HCT: 40.5 % (ref 40.0–52.0)
Hemoglobin: 14.2 g/dL (ref 13.0–18.0)
MCH: 30.1 pg (ref 26.0–34.0)
MCHC: 35.1 g/dL (ref 32.0–36.0)
MCV: 85.9 fL (ref 80.0–100.0)
PLATELETS: 178 10*3/uL (ref 150–440)
RBC: 4.72 MIL/uL (ref 4.40–5.90)
RDW: 12.5 % (ref 11.5–14.5)
WBC: 2.7 10*3/uL — AB (ref 3.8–10.6)

## 2016-10-24 LAB — TROPONIN I

## 2016-10-24 LAB — BASIC METABOLIC PANEL
Anion gap: 9 (ref 5–15)
BUN: 10 mg/dL (ref 6–20)
CALCIUM: 9.1 mg/dL (ref 8.9–10.3)
CHLORIDE: 102 mmol/L (ref 101–111)
CO2: 27 mmol/L (ref 22–32)
CREATININE: 1.12 mg/dL (ref 0.61–1.24)
Glucose, Bld: 101 mg/dL — ABNORMAL HIGH (ref 65–99)
Potassium: 3.5 mmol/L (ref 3.5–5.1)
SODIUM: 138 mmol/L (ref 135–145)

## 2016-10-24 MED ORDER — GUAIFENESIN-CODEINE 100-10 MG/5ML PO SOLN: 10.0000 mL | Freq: Four times a day (QID) | ORAL | 0 refills | Status: AC | PRN

## 2016-10-24 NOTE — ED Notes (Signed)
Dr. Schaevitz in room to assess patient.  Will continue to monitor.   

## 2016-10-24 NOTE — ED Triage Notes (Signed)
[facility] of chest pain for over a week.

## 2016-10-24 NOTE — ED Provider Notes (Signed)
[facility] Emergency Department Provider Note  ____________________________________________   First MD Initiated Contact with Patient 10/24/16 1528     (approximate)  I have reviewed the triage vital signs and the nursing notes.   HISTORY  Chief Complaint Chest Pain   HPI Jeremy Boyer is a 46 y.o. male with a history of hypertension who is presenting to the emergency department today with 1 week of chest tightness which has progressed to cough, nasal congestion as well as pressure in his forehead, face, ears as well as sore throat. He says that he is coughing up clear to white mucus in the morning. He says that he had a known flu exposure this past week to a coworker. 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 He says that he has had some chest pressure and pain but only with coughing now. Denies any chest pain or shortness of breath at this time. Says that he has been taking his losartan as prescribed.   Past Medical History:  Diagnosis Date  . Bipolar disorder [facility])     Patient Active Problem List   Diagnosis Date Noted  . MDD (major depressive disorder), recurrent episode, moderate (HCC) 01/13/2016  . OCD (obsessive compulsive disorder) 01/13/2016  . Anxiety and depression 12/01/2015    Past Surgical History:  Procedure Laterality Date  . VASECTOMY      Prior to Admission medications   Medication Sig Start Date End Date Taking? Authorizing Provider  ALPRAZolam Prudy Feeler) 1 MG tablet  10/20/15   Historical Provider, MD  cyclobenzaprine (FLEXERIL) 10 MG tablet Take by mouth. 12/01/15   Historical Provider, MD  cyproheptadine (PERIACTIN) 4 MG tablet TK 1 T PO QD 12/21/15   Historical Provider, MD  losartan (COZAAR) 50 MG tablet Take by mouth. 05/19/16 05/19/17  Historical Provider, MD  meloxicam (MOBIC) 15 MG tablet Take 15 mg by mouth daily. 11/20/15 11/19/16  Historical Provider, MD  sertraline (ZOLOFT) 100 MG  tablet Take 1.5 tablets (150 mg total) by mouth daily. 09/13/16 09/13/17  Himabindu Ravi, MD  zolpidem (AMBIEN) 10 MG tablet Take 1 tablet at bedtime to help with insomnia 09/13/16   Patrick North, MD    Allergies Patient has no known allergies.  Family History  Problem Relation Age of Onset  . Anxiety disorder Mother   . Depression Mother   . Alcohol abuse Father   . Anxiety disorder Sister   . Depression Sister     Social History Social History  Substance Use Topics  . Smoking status: Never Smoker  . Smokeless tobacco: Never Used  . Alcohol use 13.8 oz/week    2 Glasses of wine, 14 Cans of beer, 7 Shots of liquor per week    Review of Systems Constitutional: No fever/chills Eyes: No visual changes. ENT: sore throat Cardiovascular: as above Respiratory: Denies shortness of breath. Gastrointestinal: No abdominal pain.  No nausea, no vomiting.  No diarrhea.  No constipation. Genitourinary: Negative for dysuria. Musculoskeletal: Negative for back pain. Skin: Negative for rash. Neurological: Negative for headaches, focal weakness or numbness.  10-point ROS otherwise negative.  ____________________________________________   PHYSICAL EXAM:  VITAL SIGNS: ED Triage Vitals [10/24/16 1453]  Enc Vitals Group     BP (!) 164/102     Pulse Rate (!) 118     Resp 20     Temp 98.6 F (37 C)     Temp Source Oral     SpO2 99 %  Weight 185 lb (83.9 kg)     Height 5\' 10"  (1.778 m)     Head Circumference      Peak Flow      Pain Score      Pain Loc      Pain Edu?      Excl. in GC?     Constitutional: Alert and oriented. Well appearing and in no acute distress. Eyes: Conjunctivae are normal. PERRL. EOMI. Head: Atraumatic.  Mild pressure on palpation of the frontal as well as maxillary sinuses. TMs are normal bilaterally. Nose: No congestion/rhinnorhea. Mouth/Throat: Mucous membranes are moist.  Oropharynx non-erythematous. Neck: No stridor.   Cardiovascular: Normal  rate, regular rhythm. Grossly normal heart sounds.   Respiratory: Normal respiratory effort.  No retractions. Lungs CTAB. Gastrointestinal: Soft and nontender. No distention.  Musculoskeletal: No lower extremity tenderness nor edema.  No joint effusions. Neurologic:  Normal speech and language. No gross focal neurologic deficits are appreciated. No gait instability. Skin:  Skin is warm, dry and intact. No rash noted. Psychiatric: Mood and affect are normal. Speech and behavior are normal.  ____________________________________________   LABS (all labs ordered are listed, but only abnormal results are displayed)  Labs Reviewed  BASIC METABOLIC PANEL - Abnormal; Notable for the following:       Result Value   Glucose, Bld 101 (*)    All other components within normal limits  CBC - Abnormal; Notable for the following:    WBC 2.7 (*)    All other components within normal limits  TROPONIN I   ____________________________________________  EKG  ED ECG REPORT I, Arelia LongestSchaevitz,  David M, the attending physician, personally viewed and interpreted this ECG.   Date: 10/24/2016  EKG Time: 1501  Rate: 98  Rhythm: normal sinus rhythm  Axis: normal  Intervals:none  ST&T Change: No ST segment elevation or depression. No abnormal T-wave inversion.  ____________________________________________  RADIOLOGY  DG Chest 2 View (Final result)  Result time 10/24/16 15:40:56  Final result by Lacy DuverneySteven Olson, MD (10/24/16 15:40:56)           Narrative:   CLINICAL DATA: 46 year old male with chest pain the past week. Congestion. Nonsmoker. Initial encounter.  EXAM: CHEST 2 VIEW  COMPARISON: 07/16/2013 left-sided rib series. Report from 01/09/2014 chest x-ray exam images not available.  FINDINGS: No infiltrate, congestive heart failure or pneumothorax.  No plain film evidence of pulmonary malignancy.  Heart size within normal limits.  No acute osseous abnormality.  IMPRESSION: No  active cardiopulmonary disease.   Electronically Signed By: Lacy DuverneySteven Olson M.D. On: 10/24/2016 15:40          ____________________________________________   PROCEDURES  Procedure(s) performed:   Procedures  Critical Care performed:   ____________________________________________   INITIAL IMPRESSION / ASSESSMENT AND PLAN / ED COURSE  Pertinent labs & imaging results that were available during my care of the patient were reviewed by me and considered in my medical decision making (see chart for details).  Patients with what appears to be a viral upper respiratory infection. Likely eustachian tube dysfunction as well. No evidence of otitis media. Patient has been using Sudafed which may be contributing to his elevated blood pressure. I discussed with him stopping his Sudafed as well as switching to Flonase. I'll also be prescribing him a cough syrup with codeine. He is out of the window for Tamiflu. He says the symptoms are worsening this past Friday which makes him out of the 48-hour window. He'll be following up with his  primary care doctor for this ER visit but also for his leukopenia today. He may be leukopenic just because in the context of illness. However, we discussed having his blood work redrawn to make sure that this is resolving. He is understanding of the plan and willing to comply but will be discharged home.      ____________________________________________   FINAL CLINICAL IMPRESSION(S) / ED DIAGNOSES  Upper respiratory infection.    NEW MEDICATIONS STARTED DURING THIS VISIT:  New Prescriptions   No medications on file     Note:  This document was prepared using Dragon voice recognition software and may include unintentional dictation errors.    Myrna Blazer, MD 10/24/16 [phone]

## 2016-10-24 NOTE — ED Notes (Signed)
Patient denies pain and is resting comfortably.  

## 2016-11-15 ENCOUNTER — Ambulatory Visit (INDEPENDENT_AMBULATORY_CARE_PROVIDER_SITE_OTHER): Payer: BC Managed Care – PPO | Admitting: Psychiatry

## 2016-11-15 ENCOUNTER — Encounter: Payer: Self-pay | Admitting: Psychiatry

## 2016-11-15 VITALS — BP 150/95 | HR 93 | Temp 98.7°F | Wt 193.0 lb

## 2016-11-15 DIAGNOSIS — F331 Major depressive disorder, recurrent, moderate: Secondary | ICD-10-CM | POA: Diagnosis not present

## 2016-11-15 DIAGNOSIS — F429 Obsessive-compulsive disorder, unspecified: Secondary | ICD-10-CM | POA: Diagnosis not present

## 2016-11-15 MED ORDER — ZOLPIDEM TARTRATE 10 MG PO TABS: ORAL_TABLET | ORAL | 1 refills | Status: DC

## 2016-11-15 MED ORDER — SERTRALINE HCL 100 MG PO TABS: 150.0000 mg | ORAL_TABLET | Freq: Every day | ORAL | 1 refills | Status: DC

## 2016-11-15 NOTE — Progress Notes (Signed)
Patient ID: Jeremy Boyer, male   DOB: 02-13-1971, 46 y.o.   MRN: [id number]   Psychiatric Progress Note  Patient Identification: Anchor Dwan MRN:  [id number] Date of Evaluation:  11/15/2016 Chief Complaint:   Chief Complaint    Follow-up; Medication Refill     Visit Diagnosis:    ICD-9-CM ICD-10-CM   1. MDD (major depressive disorder), recurrent episode, moderate (HCC) 296.32 F33.1   2. Obsessive-compulsive disorder, unspecified type 300.3 F42.9    Diagnosis:   Patient Active Problem List   Diagnosis Date Noted  . MDD (major depressive disorder), recurrent episode, moderate (HCC) [F33.1] 01/13/2016  . OCD (obsessive compulsive disorder) [F42.9] 01/13/2016  . Anxiety and depression [F41.8] 12/01/2015   History of Present Illness:  Patient is a 46 year old Caucasian male who presents today for a follow up of depression and OCD. 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 Sleeping well, not eating as well. Patient reports that he's been doing about the same. On decreasing the Zoloft is not as tired. He is unable to exercise due to his back pain. Also reports that he was diagnosed with high blood pressure and started medication. Denies any suicidal thoughts.  Past Medical History:  Past Medical History:  Diagnosis Date  . Bipolar disorder [facility])     Past Surgical History:  Procedure Laterality Date  . VASECTOMY     Family History:  Family History  Problem Relation Age of Onset  . Anxiety disorder Mother   . Depression Mother   . Alcohol abuse Father   . Anxiety disorder Sister   . Depression Sister    Social History:   Social History   Social History  . Marital status: Divorced    Spouse name: N/A  . Number of children: N/A  . Years of education: N/A   Social History Main Topics  . Smoking status: Never Smoker  . Smokeless tobacco: Never Used  . Alcohol use 13.8 oz/week    2 Glasses of wine, 14 Cans of beer, 7 Shots  of liquor per week  . Drug use: No  . Sexual activity: Yes    Birth control/ protection: None   Other Topics Concern  . None   Social History Narrative  . None   Additional Social History:   Musculoskeletal: Strength & Muscle Tone: within normal limits Gait & Station: normal Patient leans: N/A  Psychiatric Specialty Exam: Medication Refill  Associated symptoms include headaches.  Headache    Depression         Associated symptoms include headaches.  Past medical history includes anxiety.   Anxiety       Review of Systems  Neurological: Positive for headaches.  Psychiatric/Behavioral: Negative for depression.    Blood pressure (!) 150/95, pulse 93, temperature 98.7 F (37.1 C), temperature source Oral, weight 193 lb (87.5 kg).Body mass index is 27.69 kg/m.  General Appearance: Casual  Eye Contact:  Fair  Speech:  Clear and Coherent  Volume:  Normal  Mood: stressed  Affect:  Congruent   Thought Process:  Coherent  Orientation:  Full (Time, Place, and Person)  Thought Content:  normal  Suicidal Thoughts:  No  Homicidal Thoughts:  No  Memory:  Immediate;   Fair Recent;   Fair Remote;   Fair  Judgement:  Fair  Insight:  Present  Psychomotor Activity:  Normal  Concentration:  Fair  Recall:  [employer] of Knowledge:Fair  Language: Fair  Akathisia:  No  Handed:  Right  AIMS (if indicated):  na  Assets:  Communication Skills Desire for Improvement Housing Physical Health Social Support Vocational/Educational  ADL's:  Intact  Cognition: WNL  Sleep:  Sleeping better   Is the patient at risk to self?  No. Has the patient been a risk to self in the past 6 months?  No. Has the patient been a risk to self within the distant past?  No. Is the patient a risk to others?  No. Has the patient been a risk to others in the past 6 months?  No. Has the patient been a risk to others within the distant past?  No.  Allergies:  No Known Allergies Current  Medications: Current Outpatient Prescriptions  Medication Sig Dispense Refill  . ALPRAZolam (XANAX) 1 MG tablet     . cyclobenzaprine (FLEXERIL) 10 MG tablet Take by mouth.    . cyproheptadine (PERIACTIN) 4 MG tablet TK 1 T PO QD  1  . losartan (COZAAR) 100 MG tablet Take by mouth.    . meloxicam (MOBIC) 15 MG tablet Take 15 mg by mouth daily.    . sertraline (ZOLOFT) 100 MG tablet Take 1.5 tablets (150 mg total) by mouth daily. 45 tablet 1  . zolpidem (AMBIEN) 10 MG tablet Take 1 tablet at bedtime to help with insomnia 30 tablet 1   No current facility-administered medications for this visit.     Previous Psychotropic Medications: Yes Abilify- had anxiety, Lithium- legs were weak, Depakote, Trintellix, Duloxetine, Mirtazapine 30mg   Substance Abuse History in the last 12 months:  No.  Consequences of Substance Abuse: Negative  Medical Decision Making:  Review of Psycho-Social Stressors (1), Review and summation of old records (2), Established Problem, Worsening (2) and Review of New Medication or Change in Dosage (2)  Treatment Plan Summary: Medication management  Major Depressive Disorder  Continue Zoloft to 150 mg daily Encouraged daily exercise and healthy diet. Continue therapy.  OCD  Same as above  Insomnia Take Ambien 10mg  po qhs.  RTC in 1 months. Call before if necessary.   Brnadon Eoff 2/13/20184:07 PM

## 2017-01-12 ENCOUNTER — Encounter: Payer: Self-pay | Admitting: Psychiatry

## 2017-01-12 ENCOUNTER — Ambulatory Visit (INDEPENDENT_AMBULATORY_CARE_PROVIDER_SITE_OTHER): Payer: No Typology Code available for payment source | Admitting: Psychiatry

## 2017-01-12 VITALS — BP 150/86 | HR 94 | Temp 98.8°F | Wt 184.8 lb

## 2017-01-12 DIAGNOSIS — F429 Obsessive-compulsive disorder, unspecified: Secondary | ICD-10-CM

## 2017-01-12 DIAGNOSIS — F331 Major depressive disorder, recurrent, moderate: Secondary | ICD-10-CM

## 2017-01-12 NOTE — Progress Notes (Signed)
Patient ID: Jeremy Boyer, male   DOB: [birth year], 46 y.o.   MRN: [id number]   Psychiatric Progress Note  Patient Identification: Jeremy Boyer MRN:  [id number] Date of Evaluation:  01/12/2017 Chief Complaint:    Visit Diagnosis:    ICD-9-CM ICD-10-CM   1. MDD (major depressive disorder), recurrent episode, moderate (HCC) 296.32 F33.1   2. Obsessive-compulsive disorder, unspecified type 300.3 F42.9    Diagnosis:   Patient Active Problem List   Diagnosis Date Noted  . MDD (major depressive disorder), recurrent episode, moderate (HCC) [F33.1] 01/13/2016  . OCD (obsessive compulsive disorder) [F42.9] 01/13/2016  . Anxiety and depression [F41.8] 12/01/2015   History of Present Illness:  Patient is a 46 year old Caucasian male who presents today for a follow up of depression and OCD. 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 States that it has become increasingly difficult for him to finish on his work in the 8 hours. Overall his mood has been okay. Continues to have frequent headaches. States that his son and his daughter doing well. Denies any suicidal thoughts.  Past Medical History:  Past Medical History:  Diagnosis Date  . Bipolar disorder [facility])     Past Surgical History:  Procedure Laterality Date  . VASECTOMY     Family History:  Family History  Problem Relation Age of Onset  . Anxiety disorder Mother   . Depression Mother   . Alcohol abuse Father   . Anxiety disorder Sister   . Depression Sister    Social History:   Social History   Social History  . Marital status: Divorced    Spouse name: N/A  . Number of children: N/A  . Years of education: N/A   Social History Main Topics  . Smoking status: Never Smoker  . Smokeless tobacco: Never Used  . Alcohol use 13.8 oz/week    2 Glasses of wine, 14 Cans  of beer, 7 Shots of liquor per week  . Drug use: No  . Sexual activity: Yes    Birth control/ protection: None   Other Topics Concern  . Not on file   Social History Narrative  . No narrative on file   Additional Social History:   Musculoskeletal: Strength & Muscle Tone: within normal limits Gait & Station: normal Patient leans: N/A  Psychiatric Specialty Exam: Medication Refill  Associated symptoms include headaches.  Headache    Depression         Associated symptoms include headaches.  Past medical history includes anxiety.   Anxiety       Review of Systems  Neurological: Positive for headaches.  Psychiatric/Behavioral: Negative for depression.    There were no vitals taken for this visit.There is no height or weight on file to calculate BMI.  General Appearance: Casual  Eye Contact:  Fair  Speech:  Clear and Coherent  Volume:  Normal  Mood:somewhat  stressed  Affect:  Congruent   Thought Process:  Coherent  Orientation:  Full (Time, Place, and Person)  Thought Content:  normal  Suicidal Thoughts:  No  Homicidal Thoughts:  No  Memory:  Immediate;   Fair Recent;   Fair Remote;   Fair  Judgement:  Fair  Insight:  Present  Psychomotor Activity:  Normal  Concentration:  Fair  Recall:  [employer] of Knowledge:Fair  Language: Fair  Akathisia:  No  Handed:  Right  AIMS (if indicated):  na  Assets:  Communication Skills Desire for Improvement Housing Physical Health Social Support Vocational/Educational  ADL's:  Intact  Cognition: WNL  Sleep:  Sleeping better   Is the patient at risk to self?  No. Has the patient been a risk to self in the past 6 months?  No. Has the patient been a risk to self within the distant past?  No. Is the patient a risk to others?  No. Has the patient been a risk to others in the past 6 months?  No. Has the patient been a risk to others within the distant past?  No.  Allergies:  No Known Allergies Current  Medications: Current Outpatient Prescriptions  Medication Sig Dispense Refill  . ALPRAZolam (XANAX) 1 MG tablet     . cyclobenzaprine (FLEXERIL) 10 MG tablet Take by mouth.    . cyproheptadine (PERIACTIN) 4 MG tablet TK 1 T PO QD  1  . losartan (COZAAR) 100 MG tablet Take by mouth.    . sertraline (ZOLOFT) 100 MG tablet Take 1.5 tablets (150 mg total) by mouth daily. 45 tablet 1  . zolpidem (AMBIEN) 10 MG tablet Take 1 tablet at bedtime to help with insomnia 30 tablet 1   No current facility-administered medications for this visit.     Previous Psychotropic Medications: Yes Abilify- had anxiety, Lithium- legs were weak, Depakote, Trintellix, Duloxetine, Mirtazapine   Substance Abuse History in the last 12 months:  No.  Consequences of Substance Abuse: Negative  Medical Decision Making:  Review of Psycho-Social Stressors (1), Review and summation of old records (2), Established Problem, Worsening (2) and Review of New Medication or Change in Dosage (2)  Treatment Plan Summary: Medication management  Major Depressive Disorder  Continue Zoloft to 150 mg daily Encouraged daily exercise and healthy diet. Continue therapy.  OCD  Same as above  Insomnia Take Ambien  po qhs.  RTC in 2 months. Call before if necessary.   Jeremy Boyer 4/12/20184:08 PM

## 2017-02-13 ENCOUNTER — Other Ambulatory Visit (HOSPITAL_COMMUNITY): Payer: Self-pay | Admitting: Psychiatry

## 2017-02-13 ENCOUNTER — Telehealth: Payer: Self-pay

## 2017-02-13 MED ORDER — ZOLPIDEM TARTRATE 10 MG PO TABS: ORAL_TABLET | ORAL | 1 refills | Status: DC

## 2017-02-13 NOTE — Telephone Encounter (Signed)
called pharmacy last filed on  01-10-17 it was the rx that was orginially written on  11-15-16 #30 with one refill.

## 2017-02-13 NOTE — Telephone Encounter (Signed)
recevied a fax requesting a refill on zolpidem 10mg  for pt. pt was last seen on  01-12-17 next appt  03-16-17

## 2017-02-13 NOTE — Telephone Encounter (Signed)
Ok

## 2017-02-14 NOTE — Telephone Encounter (Signed)
faxed and confirmed rx for ambien 10mg  id # [id number] order # [id number] #30 with 1 refill

## 2017-03-16 ENCOUNTER — Ambulatory Visit: Payer: No Typology Code available for payment source | Admitting: Psychiatry

## 2017-03-20 ENCOUNTER — Encounter: Payer: Self-pay | Admitting: Psychiatry

## 2017-03-20 ENCOUNTER — Ambulatory Visit (INDEPENDENT_AMBULATORY_CARE_PROVIDER_SITE_OTHER): Payer: BC Managed Care – PPO | Admitting: Psychiatry

## 2017-03-20 VITALS — BP 153/101 | HR 116 | Temp 98.9°F | Wt 191.2 lb

## 2017-03-20 DIAGNOSIS — F429 Obsessive-compulsive disorder, unspecified: Secondary | ICD-10-CM | POA: Diagnosis not present

## 2017-03-20 DIAGNOSIS — F331 Major depressive disorder, recurrent, moderate: Secondary | ICD-10-CM | POA: Diagnosis not present

## 2017-03-20 MED ORDER — ZOLPIDEM TARTRATE 10 MG PO TABS: ORAL_TABLET | ORAL | 1 refills | Status: DC

## 2017-03-20 MED ORDER — SERTRALINE HCL 100 MG PO TABS: 150.0000 mg | ORAL_TABLET | Freq: Every day | ORAL | 2 refills | Status: DC

## 2017-03-20 NOTE — Progress Notes (Signed)
Patient ID: Jeremy OvermanCullen Grant Boyer, male   DOB: 03-14-1971, 46 y.o.   MRN: [id number]   Psychiatric Progress Note  Patient Identification: Jeremy Boyer MRN:  [id number] Date of Evaluation:  03/20/2017 Chief Complaint:   Chief Complaint    Follow-up; Medication Refill     Visit Diagnosis:    ICD-10-CM   1. MDD (major depressive disorder), recurrent episode, moderate (HCC) F33.1   2. Obsessive-compulsive disorder, unspecified type F42.9    Diagnosis:   Patient Active Problem List   Diagnosis Date Noted  . MDD (major depressive disorder), recurrent episode, moderate (HCC) [F33.1] 01/13/2016  . OCD (obsessive compulsive disorder) [F42.9] 01/13/2016  . Anxiety and depression [F41.9, F32.9] 12/01/2015   History of Present Illness:  Patient is a 46 year old Caucasian male who presents today for a follow up of depression and OCD. Denies any suicidal thoughts. 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 He feels more relaxed. He is looking forward to having a somewhat routine and riding his bike more. Denies any problems with having suicidal thoughts. Continues to have low level depression which he states is a part of him. Encouraged that he needs to eat more regularly and he agrees with it.  PHQ 9 of 8  Past Medical History:  Past Medical History:  Diagnosis Date  . Bipolar disorder [facility](HCC)     Past Surgical History:  Procedure Laterality Date  . VASECTOMY     Family History:  Family History  Problem Relation Age of Onset  . Anxiety disorder Mother   . Depression Mother   . Alcohol abuse Father   . Anxiety disorder Sister   . Depression Sister    Social History:   Social History   Social History  . Marital status: Divorced    Spouse name: N/A  . Number of children: N/A  . Years of education: N/A   Social History Main Topics  . Smoking status: Never Smoker  . Smokeless tobacco: Never Used  . Alcohol use 13.8 oz/week    2 Glasses of wine,  14 Cans of beer, 7 Shots of liquor per week  . Drug use: No  . Sexual activity: Yes    Birth control/ protection: None   Other Topics Concern  . None   Social History Narrative  . None   Additional Social History:   Musculoskeletal: Strength & Muscle Tone: within normal limits Gait & Station: normal Patient leans: N/A  Psychiatric Specialty Exam: Medication Refill  Associated symptoms include headaches.  Headache    Depression         Associated symptoms include headaches.  Past medical history includes anxiety.   Anxiety       Review of Systems  Neurological: Positive for headaches.  Psychiatric/Behavioral: Negative for depression.    Blood pressure (!) 153/101, pulse (!) 116, temperature 98.9 F (37.2 C), temperature source Oral, weight 191 lb 3.2 oz (86.7 kg).Body mass index is 27.43 kg/m.  General Appearance: Casual  Eye Contact:  Fair  Speech:  Clear and Coherent  Volume:  Normal  Mood:  Improved   Affect:  Congruent   Thought Process:  Coherent  Orientation:  Full (Time, Place, and Person)  Thought Content:  normal  Suicidal Thoughts:  No  Homicidal Thoughts:  No  Memory:  Immediate;   Fair Recent;   Fair Remote;   Fair  Judgement:  Fair  Insight:  Present  Psychomotor Activity:  Normal  Concentration:  Fair  Recall:  [employer] of Knowledge:Fair  Language: Fair  Akathisia:  No  Handed:  Right  AIMS (if indicated):  na  Assets:  Communication Skills Desire for Improvement Housing Physical Health Social Support Vocational/Educational  ADL's:  Intact  Cognition: WNL  Sleep:  Sleeping better   Is the patient at risk to self?  No. Has the patient been a risk to self in the past 6 months?  No. Has the patient been a risk to self within the distant past?  No. Is the patient a risk to others?  No. Has the patient been a risk to others in the past 6 months?  No. Has the patient been a risk to others within the distant past?  No.  Allergies:   No Known Allergies Current Medications: Current Outpatient Prescriptions  Medication Sig Dispense Refill  . ALPRAZolam (XANAX) 1 MG tablet     . cyclobenzaprine (FLEXERIL) 10 MG tablet Take by mouth.    . cyproheptadine (PERIACTIN) 4 MG tablet TK 1 T PO QD  1  . losartan (COZAAR) 100 MG tablet Take by mouth.    . sertraline (ZOLOFT) 100 MG tablet Take 1.5 tablets (150 mg total) by mouth daily. 45 tablet 1  . zolpidem (AMBIEN) 10 MG tablet Take 1 tablet at bedtime to help with insomnia 30 tablet 1   No current facility-administered medications for this visit.     Previous Psychotropic Medications: Yes Abilify- had anxiety, Lithium- legs were weak, Depakote, Trintellix, Duloxetine, Mirtazapine 30mg   Substance Abuse History in the last 12 months:  No.  Consequences of Substance Abuse: Negative  Medical Decision Making:  Review of Psycho-Social Stressors (1), Review and summation of old records (2), Established Problem, Worsening (2) and Review of New Medication or Change in Dosage (2)  Treatment Plan Summary: Medication management  Major Depressive Disorder  Continue Zoloft to 150 mg daily Encouraged daily exercise and healthy diet. Continue therapy.  OCD  Same as above  Insomnia Take Ambien 10mg  po qhs.  RTC in 2 months. Call before if necessary.   Ainara Eldridge 6/18/201810:25 AM

## 2017-05-15 ENCOUNTER — Ambulatory Visit: Payer: No Typology Code available for payment source | Admitting: Psychiatry

## 2017-05-15 ENCOUNTER — Encounter: Payer: Self-pay | Admitting: Psychiatry

## 2017-05-16 ENCOUNTER — Ambulatory Visit (INDEPENDENT_AMBULATORY_CARE_PROVIDER_SITE_OTHER): Payer: BC Managed Care – PPO | Admitting: Psychiatry

## 2017-05-16 ENCOUNTER — Encounter: Payer: Self-pay | Admitting: Psychiatry

## 2017-05-16 VITALS — BP 145/99 | HR 96 | Temp 99.0°F | Wt 190.4 lb

## 2017-05-16 DIAGNOSIS — F429 Obsessive-compulsive disorder, unspecified: Secondary | ICD-10-CM | POA: Diagnosis not present

## 2017-05-16 DIAGNOSIS — F331 Major depressive disorder, recurrent, moderate: Secondary | ICD-10-CM

## 2017-05-16 MED ORDER — ZOLPIDEM TARTRATE 10 MG PO TABS: ORAL_TABLET | ORAL | 1 refills | Status: DC

## 2017-05-16 MED ORDER — SERTRALINE HCL 100 MG PO TABS: 150.0000 mg | ORAL_TABLET | Freq: Every day | ORAL | 2 refills | Status: DC

## 2017-05-16 NOTE — Progress Notes (Signed)
Patient ID: Jeremy Boyer, male   DOB: 1970/10/17, 46 y.o.   MRN: [id number]   Psychiatric Progress Note  Patient Identification: Jeremy Boyer MRN:  [id number] Date of Evaluation:  05/16/2017 Chief Complaint:   Chief Complaint    Follow-up; Medication Refill     Visit Diagnosis:    ICD-10-CM   1. MDD (major depressive disorder), recurrent episode, moderate (HCC) F33.1   2. Obsessive-compulsive disorder, unspecified type F42.9    Diagnosis:   Patient Active Problem List   Diagnosis Date Noted  . MDD (major depressive disorder), recurrent episode, moderate (HCC) [F33.1] 01/13/2016  . OCD (obsessive compulsive disorder) [F42.9] 01/13/2016  . Anxiety and depression [F41.9, F32.9] 12/01/2015   History of Present Illness:  Patient is a 46 year old Caucasian male who presents today for a follow up of depression and OCD. 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 States that the he had a good summer doing other outdoor activities. Fair sleep and appetite. However he is extremely stressed about starting back his job and the responsibilities and the logistics of the job. Denies any suicidal thoughts.  Past Medical History:  Past Medical History:  Diagnosis Date  . Bipolar disorder [facility])     Past Surgical History:  Procedure Laterality Date  . VASECTOMY     Family History:  Family History  Problem Relation Age of Onset  . Anxiety disorder Mother   . Depression Mother   . Alcohol abuse Father   . Anxiety disorder Sister   . Depression Sister    Social History:   Social History   Social History  . Marital status: Divorced    Spouse name: N/A  . Number of children: N/A  . Years of education: N/A   Social History Main Topics  . Smoking status: Never Smoker  . Smokeless tobacco: Never Used  . Alcohol use 13.8 oz/week    2 Glasses of wine, 14 Cans of beer, 7 Shots of liquor per week  . Drug use: No  . Sexual activity: Yes     Birth control/ protection: None   Other Topics Concern  . None   Social History Narrative  . None   Additional Social History:   Musculoskeletal: Strength & Muscle Tone: within normal limits Gait & Station: normal Patient leans: N/A  Psychiatric Specialty Exam: Medication Refill  Associated symptoms include headaches.  Headache    Depression         Associated symptoms include headaches.  Past medical history includes anxiety.   Anxiety       Review of Systems  Neurological: Positive for headaches.  Psychiatric/Behavioral: Negative for depression.    Blood pressure (!) 145/99, pulse 96, temperature 99 F (37.2 C), temperature source Oral, weight 190 lb 6.4 oz (86.4 kg).Body mass index is 27.32 kg/m.  General Appearance: Casual  Eye Contact:  Fair  Speech:  Clear and Coherent  Volume:  Normal  Mood:  Improved   Affect:  Congruent   Thought Process:  Coherent  Orientation:  Full (Time, Place, and Person)  Thought Content:  normal  Suicidal Thoughts:  No  Homicidal Thoughts:  No  Memory:  Immediate;   Fair Recent;   Fair Remote;   Fair  Judgement:  Fair  Insight:  Present  Psychomotor Activity:  Normal  Concentration:  Fair  Recall:  [employer] of Knowledge:Fair  Language: Fair  Akathisia:  No  Handed:  Right  AIMS (if indicated):  na  Assets:  Communication Skills Desire for Improvement Housing Physical Health Social Support Vocational/Educational  ADL's:  Intact  Cognition: WNL  Sleep:  Sleeping better   Is the patient at risk to self?  No. Has the patient been a risk to self in the past 6 months?  No. Has the patient been a risk to self within the distant past?  No. Is the patient a risk to others?  No. Has the patient been a risk to others in the past 6 months?  No. Has the patient been a risk to others within the distant past?  No.  Allergies:  No Known Allergies Current Medications: Current Outpatient Prescriptions  Medication Sig  Dispense Refill  . ALPRAZolam (XANAX) 1 MG tablet     . cyclobenzaprine (FLEXERIL) 10 MG tablet Take by mouth.    . cyproheptadine (PERIACTIN) 4 MG tablet TK 1 T PO QD  1  . losartan (COZAAR) 100 MG tablet Take by mouth.    . sertraline (ZOLOFT) 100 MG tablet Take 1.5 tablets (150 mg total) by mouth daily. 45 tablet 2  . zolpidem (AMBIEN) 10 MG tablet Take 1 tablet at bedtime to help with insomnia 30 tablet 1   No current facility-administered medications for this visit.     Previous Psychotropic Medications: Yes Abilify- had anxiety, Lithium- legs were weak, Depakote, Trintellix, Duloxetine, Mirtazapine 30mg   Substance Abuse History in the last 12 months:  No.  Consequences of Substance Abuse: Negative  Medical Decision Making:  Review of Psycho-Social Stressors (1), Review and summation of old records (2), Established Problem, Worsening (2) and Review of New Medication or Change in Dosage (2)  Treatment Plan Summary: Medication management  Major Depressive Disorder  Continue Zoloft to 150 mg daily Encouraged daily exercise and healthy diet. Start therapy with a new therapist and Desyrel sources given. We also discussed several strategies to help with the stress.  OCD  Same as above  Insomnia Take Ambien 10mg  po qhs.  RTC in 1 months. Call before if necessary.   Jeremy Boyer 8/14/20181:39 PM

## 2017-06-21 ENCOUNTER — Ambulatory Visit (INDEPENDENT_AMBULATORY_CARE_PROVIDER_SITE_OTHER): Payer: BC Managed Care – PPO | Admitting: Psychiatry

## 2017-06-21 ENCOUNTER — Encounter: Payer: Self-pay | Admitting: Psychiatry

## 2017-06-21 VITALS — BP 156/107 | HR 106 | Temp 97.4°F | Wt 192.0 lb

## 2017-06-21 DIAGNOSIS — F331 Major depressive disorder, recurrent, moderate: Secondary | ICD-10-CM

## 2017-06-21 DIAGNOSIS — F429 Obsessive-compulsive disorder, unspecified: Secondary | ICD-10-CM | POA: Diagnosis not present

## 2017-06-21 MED ORDER — ESZOPICLONE 2 MG PO TABS: 2.0000 mg | ORAL_TABLET | Freq: Every evening | ORAL | 1 refills | Status: DC | PRN

## 2017-06-21 NOTE — Progress Notes (Signed)
Patient ID: Jeremy Boyer, male   DOB: Sep 29, 1971, 46 y.o.   MRN: [id number]   Psychiatric Progress Note  Patient Identification: Jeremy Boyer MRN:  [id number] Date of Evaluation:  06/21/2017 Chief Complaint:  Doing ok Chief Complaint    Follow-up; Medication Refill     Visit Diagnosis:    ICD-10-CM   1. MDD (major depressive disorder), recurrent episode, moderate (HCC) F33.1   2. Obsessive-compulsive disorder, unspecified type F42.9    Diagnosis:   Patient Active Problem List   Diagnosis Date Noted  . MDD (major depressive disorder), recurrent episode, moderate (HCC) [F33.1] 01/13/2016  . OCD (obsessive compulsive disorder) [F42.9] 01/13/2016  . Anxiety and depression [F41.9, F32.9] 12/01/2015   History of Present Illness:  Patient is a 46 year old Caucasian male who presents today for a follow up of depression and OCD. 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 States that it does get overwhelming at times. He reports some sleepwalking while on the Ambien and feels that's causing it. States that he dismantled some things at night and did not realize it. He would like to change his sleep medication. States that his older son is in the country and is enjoying chatting with him. Some stress with his daughter not having a job and also some house repairs. Denies any suicidal thoughts.    Past Medical History:  Past Medical History:  Diagnosis Date  . Bipolar disorder [facility])     Past Surgical History:  Procedure Laterality Date  . VASECTOMY     Family History:  Family History  Problem Relation Age of Onset  . Anxiety disorder Mother   . Depression Mother   . Alcohol abuse Father   . Anxiety disorder Sister   . Depression Sister    Social History:   Social History   Social  History  . Marital status: Divorced    Spouse name: N/A  . Number of children: N/A  . Years of education: N/A   Social History Main Topics  . Smoking status: Never Smoker  . Smokeless tobacco: Never Used  . Alcohol use 13.8 oz/week    2 Glasses of wine, 14 Cans of beer, 7 Shots of liquor per week  . Drug use: No  . Sexual activity: Yes    Birth control/ protection: None   Other Topics Concern  . None   Social History Narrative  . None   Additional Social History:   Musculoskeletal: Strength & Muscle Tone: within normal limits Gait & Station: normal Patient leans: N/A  Psychiatric Specialty Exam: Medication Refill  Associated symptoms include headaches.  Headache    Depression         Associated symptoms include headaches.  Past medical history includes anxiety.   Anxiety       Review of Systems  Neurological: Positive for headaches.  Psychiatric/Behavioral: Negative for depression.    Blood pressure (!) 156/107, pulse (!) 106, temperature (!) 97.4 F (36.3 C), temperature source Oral, weight 192 lb (87.1 kg).Body mass index is 27.55 kg/m.  General Appearance: Casual  Eye Contact:  Fair  Speech:  Clear and Coherent  Volume:  Normal  Mood:  Improved   Affect:  Congruent   Thought Process:  Coherent  Orientation:  Full (Time, Place, and Person)  Thought Content:  normal  Suicidal Thoughts:  No  Homicidal Thoughts:  No  Memory:  Immediate;   Fair Recent;   Fair Remote;   Fair  Judgement:  Fair  Insight:  Present  Psychomotor Activity:  Normal  Concentration:  Fair  Recall:  [employer] of Knowledge:Fair  Language: Fair  Akathisia:  No  Handed:  Right  AIMS (if indicated):  na  Assets:  Communication Skills Desire for Improvement Housing Physical Health Social Support Vocational/Educational  ADL's:  Intact  Cognition: WNL  Sleep:  Sleep walking   Is the patient at risk to self?  No. Has the patient been a risk to self in the past 6 months?   No. Has the patient been a risk to self within the distant past?  No. Is the patient a risk to others?  No. Has the patient been a risk to others in the past 6 months?  No. Has the patient been a risk to others within the distant past?  No.  Allergies:  No Known Allergies Current Medications: Current Outpatient Prescriptions  Medication Sig Dispense Refill  . ALPRAZolam (XANAX) 1 MG tablet     . cyclobenzaprine (FLEXERIL) 10 MG tablet Take by mouth.    . cyproheptadine (PERIACTIN) 4 MG tablet TK 1 T PO QD  1  . losartan (COZAAR) 100 MG tablet Take by mouth.    . sertraline (ZOLOFT) 100 MG tablet Take 1.5 tablets (150 mg total) by mouth daily. 45 tablet 2  . zolpidem (AMBIEN) 10 MG tablet Take 1 tablet at bedtime to help with insomnia 30 tablet 1   No current facility-administered medications for this visit.     Previous Psychotropic Medications: Yes Abilify- had anxiety, Lithium- legs were weak, Depakote, Trintellix, Duloxetine, Mirtazapine   Substance Abuse History in the last 12 months:  No.  Consequences of Substance Abuse: Negative  Medical Decision Making:  Review of Psycho-Social Stressors (1), Review and summation of old records (2), Established Problem, Worsening (2) and Review of New Medication or Change in Dosage (2)  Treatment Plan Summary: Medication management  Major Depressive Disorder  Continue Zoloft to 150 mg daily Encouraged daily exercise and healthy diet. Start therapy with a new therapist. We also discussed several strategies to help with the stress.  OCD  Same as above  Insomnia Discontinue the ambien. Start Lunesta at  at bedtime as needed to help with sleeping.  RTC in 2 months. Call before if necessary.   Nayleah Gamel 9/19/20184:05 PM

## 2017-07-02 ENCOUNTER — Encounter: Payer: Self-pay | Admitting: Emergency Medicine

## 2017-07-02 ENCOUNTER — Emergency Department: Payer: BC Managed Care – PPO

## 2017-07-02 DIAGNOSIS — R1010 Upper abdominal pain, unspecified: Secondary | ICD-10-CM | POA: Diagnosis present

## 2017-07-02 DIAGNOSIS — K8020 Calculus of gallbladder without cholecystitis without obstruction: Secondary | ICD-10-CM | POA: Insufficient documentation

## 2017-07-02 DIAGNOSIS — K2900 Acute gastritis without bleeding: Secondary | ICD-10-CM | POA: Diagnosis not present

## 2017-07-02 DIAGNOSIS — Z79899 Other long term (current) drug therapy: Secondary | ICD-10-CM | POA: Diagnosis not present

## 2017-07-02 LAB — CBC
HCT: 40.6 % (ref 40.0–52.0)
HEMOGLOBIN: 14.3 g/dL (ref 13.0–18.0)
MCH: 30.1 pg (ref 26.0–34.0)
MCHC: 35.2 g/dL (ref 32.0–36.0)
MCV: 85.7 fL (ref 80.0–100.0)
PLATELETS: 219 10*3/uL (ref 150–440)
RBC: 4.74 MIL/uL (ref 4.40–5.90)
RDW: 12.7 % (ref 11.5–14.5)
WBC: 5.7 10*3/uL (ref 3.8–10.6)

## 2017-07-02 LAB — BASIC METABOLIC PANEL
ANION GAP: 13 (ref 5–15)
BUN: 11 mg/dL (ref 6–20)
CALCIUM: 9.2 mg/dL (ref 8.9–10.3)
CO2: 26 mmol/L (ref 22–32)
CREATININE: 1.19 mg/dL (ref 0.61–1.24)
Chloride: 97 mmol/L — ABNORMAL LOW (ref 101–111)
Glucose, Bld: 104 mg/dL — ABNORMAL HIGH (ref 65–99)
Potassium: 3 mmol/L — ABNORMAL LOW (ref 3.5–5.1)
SODIUM: 136 mmol/L (ref 135–145)

## 2017-07-02 LAB — TROPONIN I

## 2017-07-02 LAB — LIPASE, BLOOD: Lipase: 31 U/L (ref 11–51)

## 2017-07-02 NOTE — ED Triage Notes (Signed)
Pt arrives ambulatory to triage with CP that started x 2 hours ago. Pt reports similar episodes x 4 since January and pain radiates across entire chest. Pt is belching often in triage at this time but appears otherwise in NAD.

## 2017-07-02 NOTE — ED Notes (Signed)
Pt arrives with c/o pain across upper abdomen; epigastric area and left upper quadrant tender on palpation; pt also c/o mid back pain but unable to say if pain radiates through chest or wraps around; pt says this is the 4th episode he's had like this, last being in January; pt says the MD he was was unable to tell him anything conclusive

## 2017-07-03 ENCOUNTER — Emergency Department
Admission: EM | Admit: 2017-07-03 | Discharge: 2017-07-03 | Disposition: A | Discharge status: Home / Self Care | Payer: BC Managed Care – PPO | Attending: Emergency Medicine | Admitting: Emergency Medicine

## 2017-07-03 ENCOUNTER — Emergency Department: Payer: BC Managed Care – PPO

## 2017-07-03 DIAGNOSIS — K8020 Calculus of gallbladder without cholecystitis without obstruction: Secondary | ICD-10-CM

## 2017-07-03 DIAGNOSIS — K2900 Acute gastritis without bleeding: Secondary | ICD-10-CM

## 2017-07-03 DIAGNOSIS — R109 Unspecified abdominal pain: Secondary | ICD-10-CM

## 2017-07-03 LAB — HEPATIC FUNCTION PANEL
ALT: 27 U/L (ref 17–63)
AST: 40 U/L (ref 15–41)
Albumin: 4.1 g/dL (ref 3.5–5.0)
Alkaline Phosphatase: 75 U/L (ref 38–126)
TOTAL PROTEIN: 6.8 g/dL (ref 6.5–8.1)
Total Bilirubin: 0.5 mg/dL (ref 0.3–1.2)

## 2017-07-03 LAB — TROPONIN I

## 2017-07-03 MED ORDER — OXYCODONE-ACETAMINOPHEN 5-325 MG PO TABS
1.0000 | ORAL_TABLET | Freq: Once | ORAL | Status: AC
  Administered 2017-07-03: 1 via ORAL
  Filled 2017-07-03: qty 1

## 2017-07-03 MED ORDER — OXYCODONE-ACETAMINOPHEN 5-325 MG PO TABS: 1.0000 | ORAL_TABLET | Freq: Four times a day (QID) | ORAL | 0 refills | Status: DC | PRN

## 2017-07-03 MED ORDER — GI COCKTAIL ~~LOC~~
ORAL | Status: AC
  Filled 2017-07-03: qty 30

## 2017-07-03 MED ORDER — SUCRALFATE 1 G PO TABS: 1.0000 g | ORAL_TABLET | Freq: Two times a day (BID) | ORAL | 0 refills | Status: DC

## 2017-07-03 MED ORDER — ONDANSETRON 4 MG PO TBDP: 4.0000 mg | ORAL_TABLET | Freq: Three times a day (TID) | ORAL | 0 refills | Status: DC | PRN

## 2017-07-03 MED ORDER — GI COCKTAIL ~~LOC~~
30.0000 mL | Freq: Once | ORAL | Status: AC
  Administered 2017-07-03: 30 mL via ORAL

## 2017-07-03 NOTE — ED Notes (Signed)
Pt updated on delay, pt verbalizes understanding.  

## 2017-07-03 NOTE — ED Provider Notes (Signed)
[facility] Emergency Department Provider Note   ____________________________________________   First MD Initiated Contact with Patient 07/03/17 0143     (approximate)  I have reviewed the triage vital signs and the nursing notes.   HISTORY  Chief Complaint Chest Pain    HPI Jeremy Boyer is a 46 y.o. male who comes into the hospital today with some upper abdominal pain. He reports it started around 6:30 11/09/1941. He states that it's in his abdomen and lower chest. The pain is worse in his epigastric area and is going through to his back. He has had multiple episodes of the last few months but usually goes away after a few hours. The patient ate some lunch and not. 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 He states that it's sharp and achy as well as burning. He thought it was heartburn and took some Pepto-Bismol and his heartburn medicine but it didn't help. He also took some Tylenol with did not touch his pain. He states his pain is currently a 4 out of 10 in intensity. He did receive a GI cocktail in triage and he states that it did help. The patient denies any nausea or vomiting, fever, shortness of breath. The patient says that the pain goes the most up to his mid chest but nothing any higher. He is here for evaluation.   Past Medical History:  Diagnosis Date  . Bipolar disorder [facility])     Patient Active Problem List   Diagnosis Date Noted  . MDD (major depressive disorder), recurrent episode, moderate (HCC) 01/13/2016  . OCD (obsessive compulsive disorder) 01/13/2016  . Anxiety and depression 12/01/2015    Past Surgical History:  Procedure Laterality Date  . VASECTOMY    . VASECTOMY REVERSAL      Prior to Admission medications   Medication Sig Start Date End Date Taking? Authorizing Provider  ALPRAZolam Prudy Feeler) 1 MG tablet  10/20/15   [provider]  cyclobenzaprine (FLEXERIL) 10 MG tablet Take  by mouth. 12/01/15   [provider]  cyproheptadine (PERIACTIN) 4 MG tablet TK 1 T PO QD 12/21/15   [provider]  eszopiclone (LUNESTA) 2 MG TABS tablet Take 1 tablet (2 mg total) by mouth at bedtime as needed for sleep. Take immediately before bedtime 06/21/17 06/21/18  Patrick North, MD  losartan (COZAAR) 100 MG tablet Take by mouth. 11/10/16 11/10/17  [provider]  ondansetron (ZOFRAN ODT) 4 MG disintegrating tablet Take 1 tablet (4 mg total) by mouth every 8 (eight) hours as needed for nausea or vomiting. 07/03/17   Rebecka Apley, MD  oxyCODONE-acetaminophen (ROXICET) 5-325 MG tablet Take 1 tablet by mouth every 6 (six) hours as needed. 07/03/17   Rebecka Apley, MD  sertraline (ZOLOFT) 100 MG tablet Take 1.5 tablets (150 mg total) by mouth daily. 05/16/17 05/16/18  Patrick North, MD  sucralfate (CARAFATE) 1 g tablet Take 1 tablet (1 g total) by mouth 2 (two) times daily. 07/03/17   Rebecka Apley, MD    Allergies Patient has no known allergies.  Family History  Problem Relation Age of Onset  . Anxiety disorder Mother   . Depression Mother   . Alcohol abuse Father   . Anxiety disorder Sister   . Depression Sister     Social History Social History  Substance Use Topics  . Smoking status: Never Smoker  . Smokeless tobacco: Never Used  . Alcohol use 13.8  oz/week    2 Glasses of wine, 14 Cans of beer, 7 Shots of liquor per week    Review of Systems  Constitutional: No fever/chills Eyes: No visual changes. ENT: No sore throat. Cardiovascular:  chest pain. Respiratory: Denies shortness of breath. Gastrointestinal:  abdominal pain.  No nausea, no vomiting.  No diarrhea.  No constipation. Genitourinary: Negative for dysuria. Musculoskeletal:  back pain. Skin: Negative for rash. Neurological: Negative for headaches, focal weakness or numbness.   ____________________________________________   PHYSICAL EXAM:  VITAL SIGNS: ED Triage  Vitals  Enc Vitals Group     BP 07/02/17 2209 (!) 153/119     Pulse Rate 07/02/17 2209 68     Resp 07/02/17 2209 18     Temp 07/02/17 2209 (!) 96.4 F (35.8 C)     Temp Source 07/02/17 2209 Axillary     SpO2 07/02/17 2209 100 %     Weight 07/02/17 2206 191 lb (86.6 kg)     Height 07/02/17 2206  (1.778 m)     Head Circumference --      Peak Flow --      Pain Score 07/02/17 2206 10     Pain Loc --      Pain Edu? --      Excl. in GC? --     Constitutional: Alert and oriented. Well appearing and in mild distress. Eyes: Conjunctivae are normal. PERRL. EOMI. Head: Atraumatic. Nose: No congestion/rhinnorhea. Mouth/Throat: Mucous membranes are moist.  Oropharynx non-erythematous. Cardiovascular: Normal rate, regular rhythm. Grossly normal heart sounds.  Good peripheral circulation. Respiratory: Normal respiratory effort.  No retractions. Lungs CTAB. Gastrointestinal: Soft with some epigastric tenderness to palpation, No distention. positive bowel sounds Musculoskeletal: No lower extremity tenderness nor edema.   Neurologic:  Normal speech and language.  Skin:  Skin is warm, dry and intact.  Psychiatric: Mood and affect are normal.   ____________________________________________   LABS (all labs ordered are listed, but only abnormal results are displayed)  Labs Reviewed  BASIC METABOLIC PANEL - Abnormal; Notable for the following:       Result Value   Potassium 3.0 (*)    Chloride 97 (*)    Glucose, Bld 104 (*)    All other components within normal limits  HEPATIC FUNCTION PANEL - Abnormal; Notable for the following:    Bilirubin, Direct <0.1 (*)    All other components within normal limits  CBC  TROPONIN I  LIPASE, BLOOD  TROPONIN I   ____________________________________________  EKG  ED ECG REPORT I, Rebecka Apley, the attending physician, personally viewed and interpreted this ECG.   Date: 07/02/2017  EKG Time: 2204  Rate: 64  Rhythm: normal sinus  rhythm  Axis: normal  Intervals:none  ST&T Change: none  ____________________________________________  RADIOLOGY  Dg Chest 2 View  Result Date: 07/02/2017 CLINICAL DATA:  Upper abdominal, epigastric, and left upper quadrant pain and tenderness. Chest pain. EXAM: CHEST  2 VIEW COMPARISON:  10/24/2016 FINDINGS: The heart size and mediastinal contours are within normal limits. Both lungs are clear. The visualized skeletal structures are unremarkable. IMPRESSION: No active cardiopulmonary disease. Electronically Signed   By: Burman Nieves M.D.   On: 07/02/2017 23:02   US Abdomen Limited Ruq  Result Date: 07/03/2017 CLINICAL DATA:  Right upper quadrant pain EXAM: ULTRASOUND ABDOMEN LIMITED RIGHT UPPER QUADRANT COMPARISON:  None. FINDINGS: Gallbladder: Multiple stones in the gallbladder, largest measuring about 1.2 cm diameter. There is gallbladder wall thickening with mild pericholecystic edema.  Murphy's sign is negative. Common bile duct: Diameter: 2.7 mm, normal Liver: No focal lesion identified. Within normal limits in parenchymal echogenicity. Portal vein is patent on color Doppler imaging with normal direction of blood flow towards the liver. IMPRESSION: Cholelithiasis with thickened gallbladder wall and negative Murphy's sign. Findings are nonspecific but may be consistent with acute cholecystitis in the appropriate clinical setting. Electronically Signed   By: Burman Nieves M.D.   On: 07/03/2017 02:40    ____________________________________________   PROCEDURES  Procedure(s) performed: None  Procedures  Critical Care performed: No  ____________________________________________   INITIAL IMPRESSION / ASSESSMENT AND PLAN / ED COURSE  Pertinent labs & imaging results that were available during my care of the patient were reviewed by me and considered in my medical decision making (see chart for details).  This is a 46 year old male who comes into the hospital today with some  pain across his upper abdomen.  My differential diagnosis includes gastritis, pancreatitis, cholelithiasis or cholecystitis.  The patient did have some blood work done which was unremarkable. He does have a potassium of 3.0. We repeated the patient's troponin and I sent him for an ultrasound. I also added on lipase and hepatic function panel. The patient's ultrasound was significant for multiple gallstones in the gallbladder with some mild wall thickening. The patient though does not have any right upper quadrant pain and his blood work is unremarkable. I did give the patient a dose of Percocet as well. I had a conversation with the patient that he does have stones in his gallbladder he will need to have it removed but given his lack of pain at this time without any significant doses of pain medicine I do not feel that he has cholecystitis. I encouraged the patient to follow-up with the surgeon to have his gallbladder electively removed. We also talked about the appropriate diet that the patient should adhere to given this finding of cholelithiasis. The patient agrees and understands with the plan. He will be discharged home to follow-up with the surgical group.      ____________________________________________   FINAL CLINICAL IMPRESSION(S) / ED DIAGNOSES  Final diagnoses:  Abdominal pain  Calculus of gallbladder without cholecystitis without obstruction  Acute gastritis without hemorrhage, unspecified gastritis type      NEW MEDICATIONS STARTED DURING THIS VISIT:  Discharge Medication List as of 07/03/2017  4:41 AM    START taking these medications   Details  ondansetron (ZOFRAN ODT) 4 MG disintegrating tablet Take 1 tablet (4 mg total) by mouth every 8 (eight) hours as needed for nausea or vomiting., Starting Mon 07/03/2017, Print    oxyCODONE-acetaminophen (ROXICET) 5-325 MG tablet Take 1 tablet by mouth every 6 (six) hours as needed., Starting Mon 07/03/2017, Print    sucralfate  (CARAFATE) 1 g tablet Take 1 tablet (1 g total) by mouth 2 (two) times daily., Starting Mon 07/03/2017, Print         Note:  This document was prepared using Dragon voice recognition software and may include unintentional dictation errors.    Rebecka Apley, MD 07/03/17 [phone]

## 2017-07-03 NOTE — Discharge Instructions (Signed)
Please follow up with surgery. Please return with any worsened condition.

## 2017-07-05 DIAGNOSIS — G4700 Insomnia, unspecified: Secondary | ICD-10-CM

## 2017-07-05 DIAGNOSIS — I10 Essential (primary) hypertension: Secondary | ICD-10-CM | POA: Insufficient documentation

## 2017-07-05 DIAGNOSIS — F5105 Insomnia due to other mental disorder: Secondary | ICD-10-CM | POA: Insufficient documentation

## 2017-07-05 HISTORY — DX: Essential (primary) hypertension: I10

## 2017-07-05 HISTORY — DX: Insomnia, unspecified: G47.00

## 2017-07-06 ENCOUNTER — Ambulatory Visit (INDEPENDENT_AMBULATORY_CARE_PROVIDER_SITE_OTHER): Payer: BC Managed Care – PPO | Admitting: Surgery

## 2017-07-06 ENCOUNTER — Other Ambulatory Visit: Payer: Self-pay

## 2017-07-06 ENCOUNTER — Encounter: Payer: Self-pay | Admitting: Surgery

## 2017-07-06 VITALS — BP 142/94 | HR 87 | Temp 98.1°F | Ht 70.0 in | Wt 195.8 lb

## 2017-07-06 DIAGNOSIS — K8010 Calculus of gallbladder with chronic cholecystitis without obstruction: Secondary | ICD-10-CM

## 2017-07-06 MED ORDER — METRONIDAZOLE 500 MG PO TABS: 500.0000 mg | ORAL_TABLET | Freq: Three times a day (TID) | ORAL | 0 refills | Status: DC

## 2017-07-06 MED ORDER — GABAPENTIN 300 MG PO CAPS
300.0000 mg | ORAL_CAPSULE | Freq: Three times a day (TID) | ORAL | 0 refills | Status: DC
Start: 2017-07-06 — End: 2017-07-10

## 2017-07-06 MED ORDER — CIPROFLOXACIN HCL 500 MG PO TABS: 500.0000 mg | ORAL_TABLET | Freq: Two times a day (BID) | ORAL | 0 refills | Status: DC

## 2017-07-06 NOTE — Addendum Note (Signed)
Addended by: Cameron Proud on: 07/06/2017 04:36 PM   Modules accepted: Orders

## 2017-07-06 NOTE — Patient Instructions (Addendum)
We have scheduled your surgery on 07/10/17 at [facility] with Dr.Davis.  Please see your blue pre-care sheet for surgery information.  Please pick up your medicine at the pharmacy.  Please call our office if you have any questions or concerns.

## 2017-07-06 NOTE — Progress Notes (Signed)
Patient ID: Jeremy Boyer, male   DOB: 11-21-1970, 46 y.o.   MRN: [id number]  HPI Jeremy Boyer is a 45 y.o. male seen in consultation at the request of Dr. Zenda Alpers. He stated that he started having right upper quadrant pain about 5 days ago. Pain is sharp and intermittent and located in the right upper quadrant. Patient reports that the pain is moderate in intensity. When he went to the emergency room the pain was 10 out of 10 but now has improved. He denies any fevers and chills. He is tolerating diet. No evidence of cholangitis or biliary obstruction. I have reviewed his labs and his LFTs are normal his white count is normal. I have personally reviewed his ultrasound showing evidence of multiple stones. Normal common bile duct. There is mild wall thickening of the gallbladder. He reports recurrent episodes of her last couple months. He does have a significant psychiatric history and he takes a Xanax, Flexeril Lunesta and Zoloft  HPI  Past Medical History:  Diagnosis Date  . Anxiety and depression 12/01/2015  . Bipolar disorder (HCC)   . Hypertension 07/05/2017  . Insomnia 07/05/2017  . MDD (major depressive disorder), recurrent episode, moderate (HCC) 01/13/2016  . OCD (obsessive compulsive disorder) 01/13/2016    Past Surgical History:  Procedure Laterality Date  . VASECTOMY    . VASECTOMY REVERSAL      Family History  Problem Relation Age of Onset  . Anxiety disorder Mother   . Depression Mother   . Alcohol abuse Father   . Anxiety disorder Sister   . Depression Sister     Social History Social History  Substance Use Topics  . Smoking status: Never Smoker  . Smokeless tobacco: Never Used  . Alcohol use 13.8 oz/week    2 Glasses of wine, 14 Cans of beer, 7 Shots of liquor per week    No Known Allergies  Current Outpatient Prescriptions  Medication Sig Dispense Refill  . ALPRAZolam (XANAX) 1 MG tablet Take 0.5-1 tablets by mouth daily.    . cyclobenzaprine  (FLEXERIL) 10 MG tablet     . cyproheptadine (PERIACTIN) 4 MG tablet 1 tablet by mouth Daily  1  . eszopiclone (LUNESTA) 2 MG TABS tablet Take 1 tablet (2 mg total) by mouth at bedtime as needed for sleep. Take immediately before bedtime 30 tablet 1  . losartan-hydrochlorothiazide (HYZAAR) 100-12.5 MG tablet Take 1 tablet by mouth daily.  11  . ondansetron (ZOFRAN ODT) 4 MG disintegrating tablet Take 1 tablet (4 mg total) by mouth every 8 (eight) hours as needed for nausea or vomiting. 20 tablet 0  . oxyCODONE-acetaminophen (ROXICET) 5-325 MG tablet Take 1 tablet by mouth every 6 (six) hours as needed. 12 tablet 0  . sertraline (ZOLOFT) 100 MG tablet Take 1.5 tablets (150 mg total) by mouth daily. 45 tablet 2  . sucralfate (CARAFATE) 1 g tablet Take 1 tablet (1 g total) by mouth 2 (two) times daily. 20 tablet 0  . valsartan (DIOVAN) 320 MG tablet Take 1 tablet by mouth daily.  11  . zolpidem (AMBIEN) 10 MG tablet Take 1 tablet by mouth at bedtime.  1   No current facility-administered medications for this visit.      Review of Systems Full ROS  was asked and was negative except for the information on the HPI  Physical Exam Blood pressure (!) 142/94, pulse 87, temperature 98.1 F (36.7 C), temperature source Oral, height  (1.778 m), weight 88.8 kg (  195 lb 12.8 oz). CONSTITUTIONAL: NAD EYES: Pupils are equal, round, and reactive to light, Sclera are non-icteric. EARS, NOSE, MOUTH AND THROAT: The oropharynx is clear. The oral mucosa is pink and moist. Hearing is intact to voice. LYMPH NODES:  Lymph nodes in the neck are normal. RESPIRATORY:  Lungs are clear. There is normal respiratory effort, with equal breath sounds bilaterally, and without pathologic use of accessory muscles. CARDIOVASCULAR: Heart is regular without murmurs, gallops, or rubs. GI: The abdomen is  Soft, Tender to palpation RU no definitive murphy sign. No peritonitis. Small reducible UH GU: Rectal deferred.    MUSCULOSKELETAL: Normal muscle strength and tone. No cyanosis or edema.   SKIN: Turgor is good and there are no pathologic skin lesions or ulcers. NEUROLOGIC: Motor and sensation is grossly normal. Cranial nerves are grossly intact. PSYCH:  Oriented to person, place and time. Affect is normal.  Data Reviewed  I have personally reviewed the patient's imaging, laboratory findings and medical records.    Assessment/Plan Chronic cholecystitis due to cholelithiasis. The finger the best course of action is to perform a laparoscopic cholecystectomy sooner rather than later. I will be off next week by Dr. Earlene Plater will be willing to perform the case on Monday. Discussed with Dr. Earlene Plater in detail about this case. Also discussed with the family about the situation. I discussed the procedure in detail.  The patient was given Agricultural engineer.  We discussed the risks and benefits of a laparoscopic cholecystectomy and possible cholangiogram including, but not limited to bleeding, infection, injury to surrounding structures such as the intestine or liver, bile leak, retained gallstones, need to convert to an open procedure, prolonged diarrhea, blood clots such as  DVT, common bile duct injury, anesthesia risks, and possible need for additional procedures.  The likelihood of improvement in symptoms and return to the patient's normal status is good. We discussed the typical post-operative recovery course. He does have small reducible UH that we should address at the same operative setting. A copy of this  report will be sent to the referring provider  Sterling Big, MD FACS General Surgeon 07/06/2017, 4:17 PM

## 2017-07-07 ENCOUNTER — Telehealth: Payer: Self-pay

## 2017-07-07 NOTE — Telephone Encounter (Signed)
Patient called back and I gave him his surgery information.  Date of surgery: 07/10/2017 Provider: Dr. Earlene Plater Patient was told to go to Pre-Admit office on 07/10/2017 at 8:00 AM, then they will take him to the [facility] to the OR. Patient was also reminded to fast. Nothing to eat or drink after midnight the night before. Patient understood and had no further questions.

## 2017-07-09 MED ORDER — CEFAZOLIN SODIUM-DEXTROSE 2-4 GM/100ML-% IV SOLN
2.0000 g | INTRAVENOUS | Status: AC
  Administered 2017-07-10: 2 g via INTRAVENOUS

## 2017-07-10 ENCOUNTER — Ambulatory Visit: Payer: BC Managed Care – PPO | Admitting: Anesthesiology

## 2017-07-10 ENCOUNTER — Encounter
Admission: RE | Disposition: A | Discharge status: Home / Self Care | Payer: Self-pay | Source: Ambulatory Visit | Attending: Surgery

## 2017-07-10 ENCOUNTER — Encounter: Payer: Self-pay | Admitting: *Deleted

## 2017-07-10 ENCOUNTER — Encounter
Admission: RE | Admit: 2017-07-10 | Discharge: 2017-07-10 | Disposition: A | Discharge status: Home / Self Care | Payer: BC Managed Care – PPO | Source: Ambulatory Visit | Attending: Surgery | Admitting: Surgery

## 2017-07-10 ENCOUNTER — Ambulatory Visit
Admission: RE | Admit: 2017-07-10 | Discharge: 2017-07-10 | Disposition: A | Discharge status: Home / Self Care | Payer: BC Managed Care – PPO | Source: Ambulatory Visit | Attending: Surgery | Admitting: Surgery

## 2017-07-10 DIAGNOSIS — K8012 Calculus of gallbladder with acute and chronic cholecystitis without obstruction: Secondary | ICD-10-CM | POA: Insufficient documentation

## 2017-07-10 DIAGNOSIS — F419 Anxiety disorder, unspecified: Secondary | ICD-10-CM | POA: Diagnosis not present

## 2017-07-10 DIAGNOSIS — I10 Essential (primary) hypertension: Secondary | ICD-10-CM | POA: Diagnosis not present

## 2017-07-10 DIAGNOSIS — K811 Chronic cholecystitis: Secondary | ICD-10-CM | POA: Diagnosis present

## 2017-07-10 DIAGNOSIS — F329 Major depressive disorder, single episode, unspecified: Secondary | ICD-10-CM | POA: Diagnosis not present

## 2017-07-10 DIAGNOSIS — F429 Obsessive-compulsive disorder, unspecified: Secondary | ICD-10-CM | POA: Diagnosis not present

## 2017-07-10 DIAGNOSIS — G4700 Insomnia, unspecified: Secondary | ICD-10-CM | POA: Insufficient documentation

## 2017-07-10 DIAGNOSIS — K819 Cholecystitis, unspecified: Secondary | ICD-10-CM

## 2017-07-10 DIAGNOSIS — Z79899 Other long term (current) drug therapy: Secondary | ICD-10-CM | POA: Insufficient documentation

## 2017-07-10 HISTORY — PX: CHOLECYSTECTOMY: SHX55

## 2017-07-10 SURGERY — LAPAROSCOPIC CHOLECYSTECTOMY
Anesthesia: General

## 2017-07-10 MED ORDER — FAMOTIDINE 20 MG PO TABS
20.0000 mg | ORAL_TABLET | Freq: Once | ORAL | Status: AC
  Administered 2017-07-10: 20 mg via ORAL

## 2017-07-10 MED ORDER — OXYCODONE-ACETAMINOPHEN 5-325 MG PO TABS
ORAL_TABLET | ORAL | Status: AC
  Administered 2017-07-10: 1 via ORAL
  Filled 2017-07-10: qty 1

## 2017-07-10 MED ORDER — OXYCODONE-ACETAMINOPHEN 5-325 MG PO TABS
ORAL_TABLET | ORAL | Status: AC
  Filled 2017-07-10: qty 1

## 2017-07-10 MED ORDER — PHENYLEPHRINE HCL 10 MG/ML IJ SOLN
INTRAMUSCULAR | Status: DC | PRN
  Administered 2017-07-10: 200 ug via INTRAVENOUS
  Administered 2017-07-10 (×3): 100 ug via INTRAVENOUS
  Administered 2017-07-10: 200 ug via INTRAVENOUS
  Administered 2017-07-10 (×4): 100 ug via INTRAVENOUS
  Administered 2017-07-10 (×2): 200 ug via INTRAVENOUS

## 2017-07-10 MED ORDER — FENTANYL CITRATE (PF) 100 MCG/2ML IJ SOLN
INTRAMUSCULAR | Status: DC | PRN
  Administered 2017-07-10 (×4): 50 ug via INTRAVENOUS

## 2017-07-10 MED ORDER — GLYCOPYRROLATE 0.2 MG/ML IJ SOLN
INTRAMUSCULAR | Status: DC | PRN
  Administered 2017-07-10: 0.2 mg via INTRAVENOUS

## 2017-07-10 MED ORDER — SUCCINYLCHOLINE CHLORIDE 20 MG/ML IJ SOLN
INTRAMUSCULAR | Status: AC
  Filled 2017-07-10: qty 1

## 2017-07-10 MED ORDER — PROPOFOL 10 MG/ML IV BOLUS
INTRAVENOUS | Status: DC | PRN
  Administered 2017-07-10: 40 mg via INTRAVENOUS
  Administered 2017-07-10: 160 mg via INTRAVENOUS

## 2017-07-10 MED ORDER — ONDANSETRON HCL 4 MG/2ML IJ SOLN
INTRAMUSCULAR | Status: AC
  Filled 2017-07-10: qty 2

## 2017-07-10 MED ORDER — SUCCINYLCHOLINE CHLORIDE 20 MG/ML IJ SOLN
INTRAMUSCULAR | Status: DC | PRN
  Administered 2017-07-10: 80 mg via INTRAVENOUS
  Administered 2017-07-10: 100 mg via INTRAVENOUS

## 2017-07-10 MED ORDER — KETOROLAC TROMETHAMINE 30 MG/ML IJ SOLN
INTRAMUSCULAR | Status: AC
  Filled 2017-07-10: qty 1

## 2017-07-10 MED ORDER — MIDAZOLAM HCL 2 MG/2ML IJ SOLN
INTRAMUSCULAR | Status: DC | PRN
  Administered 2017-07-10 (×2): 2 mg via INTRAVENOUS

## 2017-07-10 MED ORDER — CEFAZOLIN SODIUM-DEXTROSE 2-4 GM/100ML-% IV SOLN
INTRAVENOUS | Status: AC
  Filled 2017-07-10: qty 100

## 2017-07-10 MED ORDER — ONDANSETRON HCL 4 MG/2ML IJ SOLN
INTRAMUSCULAR | Status: DC | PRN
  Administered 2017-07-10: 4 mg via INTRAVENOUS

## 2017-07-10 MED ORDER — DEXAMETHASONE SODIUM PHOSPHATE 10 MG/ML IJ SOLN
INTRAMUSCULAR | Status: AC
  Filled 2017-07-10: qty 1

## 2017-07-10 MED ORDER — LIDOCAINE HCL (CARDIAC) 20 MG/ML IV SOLN
INTRAVENOUS | Status: DC | PRN
  Administered 2017-07-10: 100 mg via INTRAVENOUS

## 2017-07-10 MED ORDER — ACETAMINOPHEN 10 MG/ML IV SOLN
INTRAVENOUS | Status: AC
  Filled 2017-07-10: qty 100

## 2017-07-10 MED ORDER — ONDANSETRON HCL 4 MG/2ML IJ SOLN: 4.0000 mg | Freq: Once | INTRAMUSCULAR | Status: DC | PRN

## 2017-07-10 MED ORDER — ROCURONIUM BROMIDE 50 MG/5ML IV SOLN
INTRAVENOUS | Status: AC
  Filled 2017-07-10: qty 1

## 2017-07-10 MED ORDER — LIDOCAINE HCL (PF) 2 % IJ SOLN
INTRAMUSCULAR | Status: AC
  Filled 2017-07-10: qty 10

## 2017-07-10 MED ORDER — CHLORHEXIDINE GLUCONATE CLOTH 2 % EX PADS: 6.0000 | MEDICATED_PAD | Freq: Once | CUTANEOUS | Status: DC

## 2017-07-10 MED ORDER — GLYCOPYRROLATE 0.2 MG/ML IJ SOLN
INTRAMUSCULAR | Status: AC
  Filled 2017-07-10: qty 1

## 2017-07-10 MED ORDER — SUGAMMADEX SODIUM 200 MG/2ML IV SOLN
INTRAVENOUS | Status: AC
  Filled 2017-07-10: qty 2

## 2017-07-10 MED ORDER — MIDAZOLAM HCL 2 MG/2ML IJ SOLN
INTRAMUSCULAR | Status: AC
  Filled 2017-07-10: qty 2

## 2017-07-10 MED ORDER — DEXMEDETOMIDINE HCL IN NACL 80 MCG/20ML IV SOLN
INTRAVENOUS | Status: AC
  Filled 2017-07-10: qty 20

## 2017-07-10 MED ORDER — OXYCODONE-ACETAMINOPHEN 5-325 MG PO TABS: 1.0000 | ORAL_TABLET | ORAL | 0 refills | Status: DC | PRN

## 2017-07-10 MED ORDER — PHENYLEPHRINE HCL 10 MG/ML IJ SOLN
INTRAMUSCULAR | Status: AC
  Filled 2017-07-10: qty 1

## 2017-07-10 MED ORDER — ROCURONIUM BROMIDE 100 MG/10ML IV SOLN
INTRAVENOUS | Status: DC | PRN
  Administered 2017-07-10 (×2): 10 mg via INTRAVENOUS
  Administered 2017-07-10: 50 mg via INTRAVENOUS

## 2017-07-10 MED ORDER — FENTANYL CITRATE (PF) 100 MCG/2ML IJ SOLN
INTRAMUSCULAR | Status: AC
  Filled 2017-07-10: qty 2

## 2017-07-10 MED ORDER — FENTANYL CITRATE (PF) 100 MCG/2ML IJ SOLN
25.0000 ug | INTRAMUSCULAR | Status: DC | PRN
  Administered 2017-07-10 (×4): 25 ug via INTRAVENOUS

## 2017-07-10 MED ORDER — ACETAMINOPHEN 10 MG/ML IV SOLN
INTRAVENOUS | Status: DC | PRN
  Administered 2017-07-10: 1000 mg via INTRAVENOUS

## 2017-07-10 MED ORDER — BUPIVACAINE HCL (PF) 0.5 % IJ SOLN
INTRAMUSCULAR | Status: AC
  Filled 2017-07-10: qty 30

## 2017-07-10 MED ORDER — OXYCODONE-ACETAMINOPHEN 5-325 MG PO TABS
1.0000 | ORAL_TABLET | ORAL | Status: DC | PRN
  Administered 2017-07-10 (×2): 1 via ORAL

## 2017-07-10 MED ORDER — DEXMEDETOMIDINE HCL 200 MCG/2ML IV SOLN
INTRAVENOUS | Status: DC | PRN
  Administered 2017-07-10: 8 ug via INTRAVENOUS
  Administered 2017-07-10: 4 ug via INTRAVENOUS
  Administered 2017-07-10: 8 ug via INTRAVENOUS

## 2017-07-10 MED ORDER — LACTATED RINGERS IV SOLN
INTRAVENOUS | Status: DC
  Administered 2017-07-10 (×2): via INTRAVENOUS

## 2017-07-10 MED ORDER — KETOROLAC TROMETHAMINE 30 MG/ML IJ SOLN
INTRAMUSCULAR | Status: DC | PRN
  Administered 2017-07-10: 30 mg via INTRAVENOUS

## 2017-07-10 MED ORDER — LIDOCAINE HCL (PF) 1 % IJ SOLN
INTRAMUSCULAR | Status: AC
  Filled 2017-07-10: qty 30

## 2017-07-10 MED ORDER — LIDOCAINE HCL 1 % IJ SOLN
INTRAMUSCULAR | Status: DC | PRN
  Administered 2017-07-10: 6 mL via SUBCUTANEOUS

## 2017-07-10 MED ORDER — DEXAMETHASONE SODIUM PHOSPHATE 10 MG/ML IJ SOLN
INTRAMUSCULAR | Status: DC | PRN
  Administered 2017-07-10: 10 mg via INTRAVENOUS

## 2017-07-10 MED ORDER — SUGAMMADEX SODIUM 200 MG/2ML IV SOLN
INTRAVENOUS | Status: DC | PRN
  Administered 2017-07-10: 180 mg via INTRAVENOUS

## 2017-07-10 MED ORDER — FENTANYL CITRATE (PF) 100 MCG/2ML IJ SOLN
INTRAMUSCULAR | Status: AC
  Administered 2017-07-10: 25 ug via INTRAVENOUS
  Filled 2017-07-10: qty 2

## 2017-07-10 MED ORDER — FAMOTIDINE 20 MG PO TABS
ORAL_TABLET | ORAL | Status: AC
  Filled 2017-07-10: qty 1

## 2017-07-10 SURGICAL SUPPLY — 49 items
APPLIER CLIP ROT 10 11.4 M/L (STAPLE) ×3
BAG HAMPER (MISCELLANEOUS) ×3 IMPLANT
CHLORAPREP W/TINT 26ML (MISCELLANEOUS) ×3 IMPLANT
CLIP APPLIE ROT 10 11.4 M/L (STAPLE) ×1 IMPLANT
COVER LIGHT HANDLE STERIS (MISCELLANEOUS) ×6 IMPLANT
DECANTER SPIKE VIAL GLASS SM (MISCELLANEOUS) ×6 IMPLANT
DERMABOND ADVANCED (GAUZE/BANDAGES/DRESSINGS) ×2
DERMABOND ADVANCED .7 DNX12 (GAUZE/BANDAGES/DRESSINGS) ×1 IMPLANT
DEVICE PMI PUNCTURE CLOSURE (MISCELLANEOUS) ×3 IMPLANT
DRESSING SURGICEL FIBRLLR 1X2 (HEMOSTASIS) IMPLANT
DRSG SURGICEL FIBRILLAR 1X2 (HEMOSTASIS)
ELECT REM PT RETURN 9FT ADLT (ELECTROSURGICAL) ×3
ELECTRODE REM PT RTRN 9FT ADLT (ELECTROSURGICAL) ×1 IMPLANT
GLOVE BIO SURGEON STRL SZ7 (GLOVE) ×3 IMPLANT
GLOVE BIOGEL PI IND STRL 7.5 (GLOVE) ×1 IMPLANT
GLOVE BIOGEL PI INDICATOR 7.5 (GLOVE) ×2
GLOVE ECLIPSE 7.0 STRL STRAW (GLOVE) ×3 IMPLANT
GOWN STRL REUS W/ TWL LRG LVL3 (GOWN DISPOSABLE) ×3 IMPLANT
GOWN STRL REUS W/TWL LRG LVL3 (GOWN DISPOSABLE) ×15 IMPLANT
IRRIGATION STRYKERFLOW (MISCELLANEOUS) IMPLANT
IRRIGATOR STRYKERFLOW (MISCELLANEOUS)
IV NS 1000ML (IV SOLUTION) ×2
IV NS 1000ML BAXH (IV SOLUTION) ×1 IMPLANT
KIT RM TURNOVER STRD PROC AR (KITS) ×3 IMPLANT
NEEDLE HYPO 22GX1.5 SAFETY (NEEDLE) ×3 IMPLANT
NEEDLE HYPO 25X1 1.5 SAFETY (NEEDLE) ×3 IMPLANT
NEEDLE INSUFFLATION 14GA 120MM (NEEDLE) ×3 IMPLANT
NS IRRIG 1000ML POUR BTL (IV SOLUTION) ×3 IMPLANT
PACK BASIN MINOR ARMC (MISCELLANEOUS) ×3 IMPLANT
PACK LAP CHOLECYSTECTOMY (MISCELLANEOUS) ×3 IMPLANT
PAD ARMBOARD 7.5X6 YLW CONV (MISCELLANEOUS) ×3 IMPLANT
POUCH SPECIMEN RETRIEVAL 10MM (ENDOMECHANICALS) ×3 IMPLANT
SCISSORS METZENBAUM CVD 33 (INSTRUMENTS) IMPLANT
SLEEVE ENDOPATH XCEL 5M (ENDOMECHANICALS) ×6 IMPLANT
SUT ETHIBOND CT1 BRD 2-0 30IN (SUTURE) IMPLANT
SUT ETHIBOND NAB MO 7 #0 18IN (SUTURE) IMPLANT
SUT MNCRL AB 4-0 PS2 18 (SUTURE) ×3 IMPLANT
SUT VIC AB 2-0 CT2 27 (SUTURE) IMPLANT
SUT VIC AB 3-0 54X BRD REEL (SUTURE) IMPLANT
SUT VIC AB 3-0 BRD 54 (SUTURE)
SUT VIC AB 3-0 SH 27 (SUTURE)
SUT VIC AB 3-0 SH 27X BRD (SUTURE) IMPLANT
SUT VICRYL 0 UR6 27IN ABS (SUTURE) ×3 IMPLANT
SUT VICRYL AB 3-0 FS1 BRD 27IN (SUTURE) ×3 IMPLANT
SYR 10ML LL (SYRINGE) ×3 IMPLANT
SYR BULB IRRIG 60ML STRL (SYRINGE) IMPLANT
TROCAR XCEL NON-BLD 11X100MML (ENDOMECHANICALS) ×3 IMPLANT
TROCAR XCEL NON-BLD 5MMX100MML (ENDOMECHANICALS) ×3 IMPLANT
TUBING INSUFFLATION (TUBING) ×3 IMPLANT

## 2017-07-10 NOTE — Transfer of Care (Signed)
Immediate Anesthesia Transfer of Care Note  Patient: Jeremy Boyer  Procedure(s) Performed: LAPAROSCOPIC CHOLECYSTECTOMY (N/A )  Patient Location: PACU  Anesthesia Type:General  Level of Consciousness: awake, alert  and oriented  Airway & Oxygen Therapy: Patient connected to face mask oxygen  Post-op Assessment: Post -op Vital signs reviewed and stable  Post vital signs: stable  Last Vitals:  Vitals:   07/10/17 0820 07/10/17 1357  BP: (!) 134/94 112/63  Pulse: 77 80  Resp: 18 15  Temp: 36.7 C 36.8 C  SpO2: 98% 99%    Last Pain:  Vitals:   07/10/17 1357  TempSrc: Temporal  PainSc:          Complications: No apparent anesthesia complications

## 2017-07-10 NOTE — Discharge Instructions (Signed)
In addition to included general post-operative instructions for Laparoscopic Cholecystectomy,  Diet: Resume home heart healthy diet (as discussed).   Activity: No heavy lifting >15 pounds (children, pets, laundry, garbage) or strenuous activity until follow-up, but light activity and walking are encouraged. Do not drive or drink alcohol if taking narcotic pain medications.  Wound care: 2 days after surgery (Wednesday, 10/10), may shower/get incision wet with soapy water and pat dry (do not rub incisions), but no baths or submerging incision underwater until follow-up.   Medications: Resume all home medications. For mild to moderate pain: acetaminophen (Tylenol) or ibuprofen (if no kidney disease). Combining Tylenol with alcohol can substantially increase your risk of causing liver disease. Narcotic pain medications, if prescribed, can be used for severe pain, though may cause nausea, constipation, and drowsiness. Do not combine Tylenol and Percocet within a 6 hour period as Percocet contains Tylenol. If you do not need the narcotic pain medication, you do not need to fill the prescription.  Call office [phone]) at any time if any questions, worsening pain, fevers/chills, bleeding, drainage from incision site, or other concerns.    AMBULATORY SURGERY  DISCHARGE INSTRUCTIONS   1) The drugs that you were given will stay in your system until tomorrow so for the next 24 hours you should not:  A) Drive an automobile B) Make any legal decisions C) Drink any alcoholic beverage   2) You may resume regular meals tomorrow.  Today it is better to start with liquids and gradually work up to solid foods.  You may eat anything you prefer, but it is better to start with liquids, then soup and crackers, and gradually work up to solid foods.   3) Please notify your doctor immediately if you have any unusual bleeding, trouble breathing, redness and pain at the surgery site, drainage, fever, or pain  not relieved by medication.    4) Additional Instructions:        Please contact your physician with any problems or Same Day Surgery at [phone], Monday through Friday 6 am to 4 pm, or [place] at [facility] number at [phone].

## 2017-07-10 NOTE — Interval H&P Note (Signed)
History and Physical Interval Note:  07/10/2017 9:39 AM  Jeremy Boyer  has presented today for surgery, with the diagnosis of Chronic cholecystitis  The various methods of treatment have been discussed with the patient and family. After consideration of risks, benefits and other options for treatment, the patient has consented to  Procedure(s): LAPAROSCOPIC CHOLECYSTECTOMY umbilical hernia repair (N/A) HERNIA REPAIR UMBILICAL ADULT (N/A) as a surgical intervention .  The patient's history has been reviewed, patient examined, no change in status, stable for surgery.  I have reviewed the patient's chart and labs.  Questions were answered to the patient's satisfaction.     Ancil Linsey

## 2017-07-10 NOTE — Anesthesia Preprocedure Evaluation (Signed)
Anesthesia Evaluation  Patient identified by MRN, date of birth, ID band Patient awake    Reviewed: Allergy & Precautions, NPO status , Patient's Chart, lab work & pertinent test results, reviewed documented beta blocker date and time   Airway Mallampati: III  TM Distance: >3 FB     Dental  (+) Chipped   Pulmonary           Cardiovascular hypertension, Pt. on medications      Neuro/Psych PSYCHIATRIC DISORDERS Anxiety Depression Bipolar Disorder    GI/Hepatic   Endo/Other    Renal/GU      Musculoskeletal   Abdominal   Peds  Hematology   Anesthesia Other Findings Hypertensive.  Reproductive/Obstetrics                             Anesthesia Physical Anesthesia Plan  ASA: III  Anesthesia Plan: General   Post-op Pain Management:    Induction: Intravenous  PONV Risk Score and Plan:   Airway Management Planned: Oral ETT  Additional Equipment:   Intra-op Plan:   Post-operative Plan:   Informed Consent: I have reviewed the patients History and Physical, chart, labs and discussed the procedure including the risks, benefits and alternatives for the proposed anesthesia with the patient or authorized representative who has indicated his/her understanding and acceptance.     Plan Discussed with: CRNA  Anesthesia Plan Comments:         Anesthesia Quick Evaluation

## 2017-07-10 NOTE — Op Note (Signed)
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)  PROCEDURE(S): (cpt's: 47562) 1.) Laparoscopic Cholecystectomy  INTRAOPERATIVE FINDINGS: Severe pericholecystic inflammation along with an aberrant arterial branch running medial and parallel to gallbladder before inserted only into the dome/apex of the gallbladder and confirmed not to insert into the adjacent liver  INTRAOPERATIVE FLUIDS: 1500 mL crystalloid   ESTIMATED BLOOD LOSS: Minimal (<30 mL)   URINE OUTPUT: No foley  SPECIMENS: Gallbladder  IMPLANTS: None  DRAINS: None   COMPLICATIONS: None apparent   CONDITION AT COMPLETION: Hemodynamically stable and extubated  DISPOSITION: PACU   INDICATION(S) FOR PROCEDURE:  Patient is a 46 y.o. male who recently presented with post-prandial RUQ > epigastric abdominal pain after eating fatty foods in particular. Ultrasound suggested cholecystitis, but patient's pain had resolved and he was discharged home with outpatient surgical follow-up, at which time all risks, benefits, and alternatives to above elective procedures were discussed with the patient, who elected to proceed, and informed consent was accordingly obtained.   DETAILS OF PROCEDURE:  Patient was brought to the operating suite and appropriately identified. General anesthesia was administered along with peri-operative prophylactic IV antibiotics, and endotracheal intubation was performed by anesthesiologist, along with NG/OG tube for gastric decompression. In supine position, operative site was prepped and draped in usual sterile fashion, and following a brief time out, initial 5 mm incision was made in a natural skin crease just above the umbilicus. Fascia was then elevated, and a Verress needle was inserted and its proper position  confirmed using aspiration and saline meniscus test.  Upon insufflation of the abdominal cavity with carbon dioxide to a well-tolerated pressure of 12-15 mmHg, 5 mm peri-umbilical port followed by laparoscope were inserted and used to inspect the abdominal cavity and its contents with no injuries from insertion of the first trochar noted. Three additional trocars were inserted, one at the epigastric position (10 mm) and two along the Right costal margin (5 mm). The table was then placed in reverse Trendelenburg position with the Right side up. Extensive filmy and fatty dense adhesions between the gallbladder and omentum/duodenum/transverse colon were lysed using combined blunt dissection and selective electrocautery. The apex/dome of the gallbladder was grasped with an atraumatic grasper passed through the lateral port and retracted apically over the liver. The infundibulum was also grasped and retracted, exposing Calot's triangle. The peritoneum overlying the gallbladder infundibulum was incised and dissected free of surrounding peritoneal attachments, revealing the cystic duct which was clipped twice on the patient side and once on the gallbladder specimen side close to the gallbladder. What appeared to be a small artery going into the gallbladder from a slightly larger artery was likewise clipped twice on the patient side and once on the gallbladder specimen side. However, the larger (but still relatively small) artery from which the first arterial branch entered the gallbladder continued to run parallel and medial to the gallbladder before entering the gallbladder at the dome/apex of the gallbladder. Upon confirming that this artery did not enter the liver at any level, it was likewise doubly clipped on the patient side and divided distally before it entered the dome/apex of the gallbladder. The remainder of the gallbladder was then dissected from its peritoneal attachments to the liver using electrocautery,  during which a small to moderate amount of thick biliary sludge spilled, and the gallbladder was placed into a laparoscopic specimen  bag and removed from the abdominal cavity via the epigastric port site. Small amount of thick biliary sludge was irrigated and suctioned until clear, hemostasis and secure placement of clips were confirmed, and intra-peritoneal cavity was inspected with no additional findings. PMI laparoscopic fascial closure device was then used to re-approximate fascia at the 10 mm epigastric port site.  All ports were then removed under direct visualization, and abdominal cavity was desuflated. All port sites were irrigated/cleaned, additional local anesthetic was injected at each incision, and subcuticular 4-0 Monocryl suture was used to re-approximate skin. Skin was then cleaned, dried, and sterile skin glue was applied. Patient was then safely able to be awakened, extubated, and transferred to PACU for post-operative monitoring and care.   I was present for all aspects of procedure, and there were no intra-operative complications apparent.

## 2017-07-10 NOTE — Anesthesia Postprocedure Evaluation (Signed)
Anesthesia Post Note  Patient: [name]  Procedure(s) Performed: LAPAROSCOPIC CHOLECYSTECTOMY (N/A )  Patient location during evaluation: PACU Anesthesia Type: General Level of consciousness: awake and alert Pain management: pain level controlled Vital Signs Assessment: post-procedure vital signs reviewed and stable Respiratory status: spontaneous breathing, nonlabored ventilation, respiratory function stable and patient connected to nasal cannula oxygen Cardiovascular status: blood pressure returned to baseline and stable Postop Assessment: no apparent nausea or vomiting Anesthetic complications: no     Last Vitals:  Vitals:   07/10/17 1415 07/10/17 1430  BP: (!) 121/99 121/82  Pulse: (!) 103 97  Resp: 15 18  Temp:    SpO2: 100% 99%    Last Pain:  Vitals:   07/10/17 1429  TempSrc:   PainSc: 8                  Raed Schalk S

## 2017-07-10 NOTE — Anesthesia Post-op Follow-up Note (Signed)
Anesthesia QCDR form completed.        

## 2017-07-10 NOTE — Anesthesia Procedure Notes (Signed)
Procedure Name: Intubation Date/Time: 07/10/2017 11:21 AM Performed by: Irving Burton Pre-anesthesia Checklist: Patient identified, Emergency Drugs available, Suction available and Patient being monitored Patient Re-evaluated:Patient Re-evaluated prior to induction Oxygen Delivery Method: Circle system utilized Preoxygenation: Pre-oxygenation with 100% oxygen Induction Type: IV induction Ventilation: Oral airway inserted - appropriate to patient size and Two handed mask ventilation required Laryngoscope Size: McGraph and 4 Grade View: Grade II Tube type: Oral Tube size: 7.5 mm Number of attempts: 2 Airway Equipment and Method: Bougie stylet and Video-laryngoscopy Placement Confirmation: positive ETCO2 and breath sounds checked- equal and bilateral Secured at: 22 cm Tube secured with: Tape Dental Injury: Teeth and Oropharynx as per pre-operative assessment  Difficulty Due To: Difficulty was anticipated and Difficult Airway- due to anterior larynx Future Recommendations: Recommend- induction with short-acting agent, and alternative techniques readily available

## 2017-07-10 NOTE — H&P (View-Only) (Signed)
Patient ID: Jeremy Boyer, male   DOB: 11-21-1970, 46 y.o.   MRN: [id number]  HPI Jeremy Boyer is a 45 y.o. male seen in consultation at the request of Dr. Zenda Alpers. He stated that he started having right upper quadrant pain about 5 days ago. Pain is sharp and intermittent and located in the right upper quadrant. Patient reports that the pain is moderate in intensity. When he went to the emergency room the pain was 10 out of 10 but now has improved. He denies any fevers and chills. He is tolerating diet. No evidence of cholangitis or biliary obstruction. I have reviewed his labs and his LFTs are normal his white count is normal. I have personally reviewed his ultrasound showing evidence of multiple stones. Normal common bile duct. There is mild wall thickening of the gallbladder. He reports recurrent episodes of her last couple months. He does have a significant psychiatric history and he takes a Xanax, Flexeril Lunesta and Zoloft  HPI  Past Medical History:  Diagnosis Date  . Anxiety and depression 12/01/2015  . Bipolar disorder (HCC)   . Hypertension 07/05/2017  . Insomnia 07/05/2017  . MDD (major depressive disorder), recurrent episode, moderate (HCC) 01/13/2016  . OCD (obsessive compulsive disorder) 01/13/2016    Past Surgical History:  Procedure Laterality Date  . VASECTOMY    . VASECTOMY REVERSAL      Family History  Problem Relation Age of Onset  . Anxiety disorder Mother   . Depression Mother   . Alcohol abuse Father   . Anxiety disorder Sister   . Depression Sister     Social History Social History  Substance Use Topics  . Smoking status: Never Smoker  . Smokeless tobacco: Never Used  . Alcohol use 13.8 oz/week    2 Glasses of wine, 14 Cans of beer, 7 Shots of liquor per week    No Known Allergies  Current Outpatient Prescriptions  Medication Sig Dispense Refill  . ALPRAZolam (XANAX) 1 MG tablet Take 0.5-1 tablets by mouth daily.    . cyclobenzaprine  (FLEXERIL) 10 MG tablet     . cyproheptadine (PERIACTIN) 4 MG tablet 1 tablet by mouth Daily  1  . eszopiclone (LUNESTA) 2 MG TABS tablet Take 1 tablet (2 mg total) by mouth at bedtime as needed for sleep. Take immediately before bedtime 30 tablet 1  . losartan-hydrochlorothiazide (HYZAAR) 100-12.5 MG tablet Take 1 tablet by mouth daily.  11  . ondansetron (ZOFRAN ODT) 4 MG disintegrating tablet Take 1 tablet (4 mg total) by mouth every 8 (eight) hours as needed for nausea or vomiting. 20 tablet 0  . oxyCODONE-acetaminophen (ROXICET) 5-325 MG tablet Take 1 tablet by mouth every 6 (six) hours as needed. 12 tablet 0  . sertraline (ZOLOFT) 100 MG tablet Take 1.5 tablets (150 mg total) by mouth daily. 45 tablet 2  . sucralfate (CARAFATE) 1 g tablet Take 1 tablet (1 g total) by mouth 2 (two) times daily. 20 tablet 0  . valsartan (DIOVAN) 320 MG tablet Take 1 tablet by mouth daily.  11  . zolpidem (AMBIEN) 10 MG tablet Take 1 tablet by mouth at bedtime.  1   No current facility-administered medications for this visit.      Review of Systems Full ROS  was asked and was negative except for the information on the HPI  Physical Exam Blood pressure (!) 142/94, pulse 87, temperature 98.1 F (36.7 C), temperature source Oral, height  (1.778 m), weight 88.8 kg (  195 lb 12.8 oz). CONSTITUTIONAL: NAD EYES: Pupils are equal, round, and reactive to light, Sclera are non-icteric. EARS, NOSE, MOUTH AND THROAT: The oropharynx is clear. The oral mucosa is pink and moist. Hearing is intact to voice. LYMPH NODES:  Lymph nodes in the neck are normal. RESPIRATORY:  Lungs are clear. There is normal respiratory effort, with equal breath sounds bilaterally, and without pathologic use of accessory muscles. CARDIOVASCULAR: Heart is regular without murmurs, gallops, or rubs. GI: The abdomen is  Soft, Tender to palpation RU no definitive murphy sign. No peritonitis. Small reducible UH GU: Rectal deferred.    MUSCULOSKELETAL: Normal muscle strength and tone. No cyanosis or edema.   SKIN: Turgor is good and there are no pathologic skin lesions or ulcers. NEUROLOGIC: Motor and sensation is grossly normal. Cranial nerves are grossly intact. PSYCH:  Oriented to person, place and time. Affect is normal.  Data Reviewed  I have personally reviewed the patient's imaging, laboratory findings and medical records.    Assessment/Plan Chronic cholecystitis due to cholelithiasis. The finger the best course of action is to perform a laparoscopic cholecystectomy sooner rather than later. I will be off next week by Dr. Davis will be willing to perform the case on Monday. Discussed with Dr. Davis in detail about this case. Also discussed with the family about the situation. I discussed the procedure in detail.  The patient was given educational material.  We discussed the risks and benefits of a laparoscopic cholecystectomy and possible cholangiogram including, but not limited to bleeding, infection, injury to surrounding structures such as the intestine or liver, bile leak, retained gallstones, need to convert to an open procedure, prolonged diarrhea, blood clots such as  DVT, common bile duct injury, anesthesia risks, and possible need for additional procedures.  The likelihood of improvement in symptoms and return to the patient's normal status is good. We discussed the typical post-operative recovery course. He does have small reducible UH that we should address at the same operative setting. A copy of this  report will be sent to the referring provider  Diego Pabon, MD FACS General Surgeon 07/06/2017, 4:17 PM   

## 2017-07-11 ENCOUNTER — Encounter: Payer: Self-pay | Admitting: Surgery

## 2017-07-11 LAB — SURGICAL PATHOLOGY

## 2017-08-02 ENCOUNTER — Other Ambulatory Visit: Payer: Self-pay

## 2017-08-03 ENCOUNTER — Ambulatory Visit (INDEPENDENT_AMBULATORY_CARE_PROVIDER_SITE_OTHER): Payer: BC Managed Care – PPO | Admitting: Surgery

## 2017-08-03 ENCOUNTER — Encounter: Payer: Self-pay | Admitting: Surgery

## 2017-08-03 VITALS — BP 136/95 | HR 93 | Temp 98.5°F | Ht 70.0 in | Wt 191.4 lb

## 2017-08-03 DIAGNOSIS — K819 Cholecystitis, unspecified: Secondary | ICD-10-CM

## 2017-08-03 NOTE — Progress Notes (Signed)
Surgical Clinic Progress/Follow-up Note   HPI:  46 y.o. Male presents to clinic for post-op follow-up evaluation s/p laparoscopic cholecystectomy for symptomatic cholelithiasis with intraoperative findings consistent with chronic cholecystitis. 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  Review of Systems:  Constitutional: denies any other weight loss, fever, chills, or sweats  Eyes: denies any other vision changes, history of eye injury  ENT: denies sore throat, hearing problems  Respiratory: denies shortness of breath, wheezing  Cardiovascular: denies chest pain, palpitations  Gastrointestinal: abdominal pain, N/V, and bowel function as per HPI Musculoskeletal: denies any other joint pains or cramps  Skin: Denies any other rashes or skin discolorations  Neurological: denies any other headache, dizziness, weakness  Psychiatric: denies any other depression, anxiety  All other review of systems: otherwise negative   Vital Signs:  BP (!) 136/95   Pulse 93   Temp 98.5 F (36.9 C) (Oral)   Ht 5\' 10"  (1.778 m)   Wt 191 lb 6.4 oz (86.8 kg)   BMI 27.46 kg/m    Physical Exam:  Constitutional:  -- Normal body habitus  -- Awake, alert, and oriented x3  Eyes:  -- Pupils equally round and reactive to light  -- No scleral icterus  Ear, nose, throat:  -- No jugular venous distension  -- No nasal drainage, bleeding Pulmonary:  -- No crackles -- Equal breath sounds bilaterally -- Breathing non-labored at rest Cardiovascular:  -- S1, S2 present  -- No pericardial rubs  Gastrointestinal:  -- Soft, nontender, non-distended, no guarding/rebound -- Incisions well-approximated without surrounding  erythema or drainage -- No abdominal masses appreciated, pulsatile or otherwise  Musculoskeletal / Integumentary:  -- Wounds or skin discoloration: None appreciated except post-surgical wounds as described above (GI) -- Extremities: B/L UE and LE FROM, hands and feet warm, no edema  Neurologic:  -- Motor function: intact and symmetric  -- Sensation: intact and symmetric   Assessment:  46 y.o. yo Male with a problem list including...  Patient Active Problem List   Diagnosis Date Noted  . Cholecystitis   . Hypertension 07/05/2017  . Insomnia 07/05/2017  . MDD (major depressive disorder), recurrent episode, moderate (HCC) 01/13/2016  . OCD (obsessive compulsive disorder) 01/13/2016  . Anxiety and depression 12/01/2015    presents to clinic for post-op follow-up evaluation, doing well s/p laparoscopic cholecystectomy for symptomatic cholelithiasis with intraoperative findings of chronic cholecystitis and likely recovering from gastroenteritis which began 2 weeks following surgery and has been improving since it was worst this past weekend.  Plan:              - advance diet as tolerated              - okay to submerge incisions under water (baths, swimming) prn             - gradually resume all activities without restrictions over next 2 weeks             - apply sunblock particularly to incisions with sun exposure to reduce pigmentation of scars             - return to clinic as needed, instructed to call office if any questions or concerns   All of the above recommendations were  discussed with the patient, and all of patient's questions were answered to his expressed satisfaction.  -- Scherrie GerlachJason E. Earlene Plateravis, MD, RPVI [place]: [facility] General [facility] - Partnering for exceptional care. Office: [phone]

## 2017-08-03 NOTE — Patient Instructions (Signed)
Please call our office if you have any questions.   You may resume your normal activities at this time. Listen to your body. If you have pain when lifting then stop and try again a a few days.

## 2017-08-10 ENCOUNTER — Telehealth: Payer: Self-pay | Admitting: Psychiatry

## 2017-08-17 NOTE — Telephone Encounter (Signed)
HAVE YOU ALREADY CALLED THIS PATIENT?

## 2017-08-21 NOTE — Telephone Encounter (Signed)
I called and left a message

## 2017-08-22 ENCOUNTER — Ambulatory Visit: Payer: No Typology Code available for payment source | Admitting: Psychiatry

## 2017-08-22 ENCOUNTER — Other Ambulatory Visit: Payer: Self-pay

## 2017-08-22 ENCOUNTER — Encounter: Payer: Self-pay | Admitting: Psychiatry

## 2017-08-22 VITALS — BP 152/92 | HR 98 | Temp 98.8°F | Wt 193.2 lb

## 2017-08-22 DIAGNOSIS — F429 Obsessive-compulsive disorder, unspecified: Secondary | ICD-10-CM

## 2017-08-22 DIAGNOSIS — F331 Major depressive disorder, recurrent, moderate: Secondary | ICD-10-CM

## 2017-08-22 MED ORDER — ZOLPIDEM TARTRATE ER 12.5 MG PO TBCR: 12.5000 mg | EXTENDED_RELEASE_TABLET | Freq: Every evening | ORAL | 1 refills | Status: DC | PRN

## 2017-08-22 MED ORDER — SERTRALINE HCL 100 MG PO TABS: 150.0000 mg | ORAL_TABLET | Freq: Every day | ORAL | 2 refills | Status: DC

## 2017-08-22 NOTE — Progress Notes (Signed)
Patient ID: Jeremy OvermanCullen Grant Noyola, male   DOB: 11-21-1970, 46 y.o.   MRN: [id number]   Psychiatric Progress Note  Patient Identification: Jeremy Boyer MRN:  [id number] Date of Evaluation:  08/22/2017 Chief Complaint:  stressed Chief Complaint    Follow-up; Medication Refill     Visit Diagnosis:    ICD-10-CM   1. MDD (major depressive disorder), recurrent episode, moderate (HCC) F33.1   2. Obsessive-compulsive disorder, unspecified type F42.9    Diagnosis:   Patient Active Problem List   Diagnosis Date Noted  . Cholecystitis [K81.9]   . Hypertension [I10] 07/05/2017  . Insomnia [G47.00] 07/05/2017  . MDD (major depressive disorder), recurrent episode, moderate (HCC) [F33.1] 01/13/2016  . OCD (obsessive compulsive disorder) [F42.9] 01/13/2016  . Anxiety and depression [F41.9, F32.9] 12/01/2015   History of Present Illness:  Patient is a 46 year old Caucasian male who presents today for a follow up of depression and OCD. Patient reports that school has been okay, some challenges. He recently had gall bladder surgery and it went well. States he recovered well. Having trouble sleeping on the lunesta.   We discussed going back on the Adrianambien since he has not done well on Trazodone, seroquel. Patient agreeable starting back again on the Ambien. We discussed that since he had some sleep walking episodes to lock his room and have his wife alerted. States that he is not a Thanksgiving person and would go to have dinner at his mother's place but he is looking forward to having time off.    Past Medical History:  Past Medical History:  Diagnosis Date  . Anxiety and depression 12/01/2015  . Bipolar disorder (HCC)   . Cholecystitis   . Hypertension 07/05/2017  . Insomnia 07/05/2017  . MDD (major depressive disorder), recurrent episode, moderate (HCC) 01/13/2016  . OCD (obsessive compulsive disorder) 01/13/2016    Past Surgical History:  Procedure Laterality Date  . CHOLECYSTECTOMY N/A  07/10/2017   Procedure: LAPAROSCOPIC CHOLECYSTECTOMY;  Surgeon: Ancil Linseyavis, Jason Evan, MD;  Location: [facility] ORS;  Service: General;  Laterality: N/A;  . VASECTOMY    . VASECTOMY REVERSAL     Family History:  Family History  Problem Relation Age of Onset  . Anxiety disorder Mother   . Depression Mother   . Alcohol abuse Father   . Anxiety disorder Sister   . Depression Sister    Social History:   Social History   Socioeconomic History  . Marital status: Married    Spouse name: nancy  . Number of children: 3  . Years of education: None  . Highest education level: Bachelor's degree (e.g., BA, AB, BS)  Social Needs  . Financial resource strain: Not hard at all  . Food insecurity - worry: Never true  . Food insecurity - inability: Never true  . Transportation needs - medical: No  . Transportation needs - non-medical: No  Occupational History  . None  Tobacco Use  . Smoking status: Never Smoker  . Smokeless tobacco: Never Used  Substance and Sexual Activity  . Alcohol use: Yes    Alcohol/week: 13.8 oz    Types: 2 Glasses of wine, 14 Cans of beer, 7 Shots of liquor per week  . Drug use: No  . Sexual activity: Yes    Birth control/protection: None  Other Topics Concern  . None  Social History Narrative  . None   Additional Social History:   Musculoskeletal: Strength & Muscle Tone: within normal limits Gait & Station: normal Patient leans: N/A  Psychiatric Specialty Exam: Medication Refill  Associated symptoms include headaches.  Headache    Depression         Associated symptoms include headaches.  Past medical history includes anxiety.   Anxiety       Review of Systems  Neurological: Positive for headaches.  Psychiatric/Behavioral: Negative for depression.    Blood pressure (!) 152/92, pulse 98, temperature 98.8 F (37.1 C), temperature source Oral, weight 193 lb 3.2 oz (87.6 kg).Body mass index is 27.72 kg/m.  General Appearance: Casual  Eye Contact:  Fair   Speech:  Clear and Coherent  Volume:  Normal  Mood:  stressed  Affect:  Congruent   Thought Process:  Coherent  Orientation:  Full (Time, Place, and Person)  Thought Content:  normal  Suicidal Thoughts:  No  Homicidal Thoughts:  No  Memory:  Immediate;   Fair Recent;   Fair Remote;   Fair  Judgement:  Fair  Insight:  Present  Psychomotor Activity:  Normal  Concentration:  Fair  Recall:  [employer]Fair  Fund of Knowledge:Fair  Language: Fair  Akathisia:  No  Handed:  Right  AIMS (if indicated):  na  Assets:  Communication Skills Desire for Improvement Housing Physical Health Social Support Vocational/Educational  ADL's:  Intact  Cognition: WNL  Sleep:  poor   Is the patient at risk to self?  No. Has the patient been a risk to self in the past 6 months?  No. Has the patient been a risk to self within the distant past?  No. Is the patient a risk to others?  No. Has the patient been a risk to others in the past 6 months?  No. Has the patient been a risk to others within the distant past?  No.  Allergies:  No Known Allergies Current Medications: Current Outpatient Medications  Medication Sig Dispense Refill  . cyclobenzaprine (FLEXERIL) 10 MG tablet Take 10 mg by mouth 3 (three) times daily as needed for muscle spasms.     . eszopiclone (LUNESTA) 2 MG TABS tablet Take 1 tablet (2 mg total) by mouth at bedtime as needed for sleep. Take immediately before bedtime (Patient taking differently: Take 2 mg by mouth at bedtime. Take immediately before bedtime) 30 tablet 1  . sertraline (ZOLOFT) 100 MG tablet Take 1.5 tablets (150 mg total) by mouth daily. 45 tablet 2  . valsartan (DIOVAN) 320 MG tablet Take 1 tablet by mouth daily.  11   No current facility-administered medications for this visit.     Previous Psychotropic Medications: Yes Abilify- had anxiety, Lithium- legs were weak, Depakote, Trintellix, Duloxetine, Mirtazapine 30mg   Substance Abuse History in the last 12 months:   No.  Consequences of Substance Abuse: Negative  Medical Decision Making:  Review of Psycho-Social Stressors (1), Review and summation of old records (2), Established Problem, Worsening (2) and Review of New Medication or Change in Dosage (2)  Treatment Plan Summary: Medication management  Major Depressive Disorder  Continue Zoloft to 150 mg daily Encouraged daily exercise and healthy diet. Start therapy with a new therapist. We also discussed several strategies to help with the stress.  OCD  Same as above  Insomnia Discontinue the lunesta. Start Ambien at 12.5 mg at bedtime as needed to help with sleeping.  RTC in 2 months. Call before if necessary.   Darius Fillingim 11/20/20184:04 PM

## 2017-10-05 IMAGING — US US ABDOMEN LIMITED
1 series · 14 of 25 positions shown · non-contrast
Comparison: None.

CLINICAL DATA: Right upper quadrant pain

EXAM:
ULTRASOUND ABDOMEN LIMITED RIGHT UPPER QUADRANT

[Series 1: us abdomen limited · 0.28mm/px · 14 of 32 slices shown]
[im 1/32]
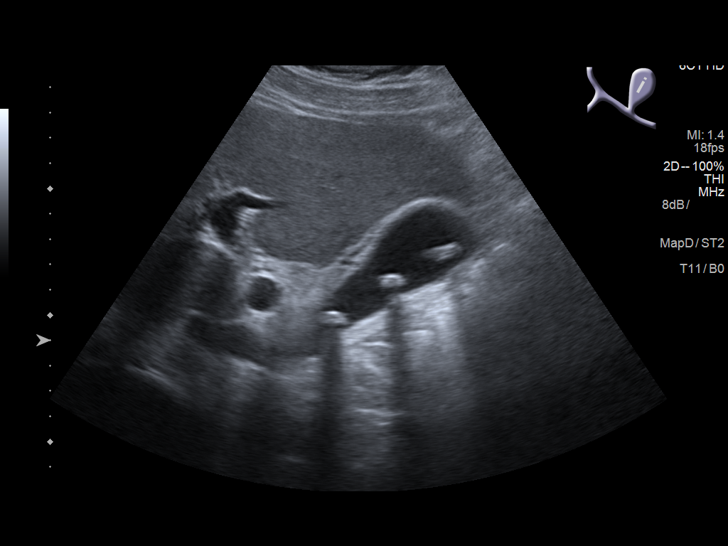
[im 3/32]
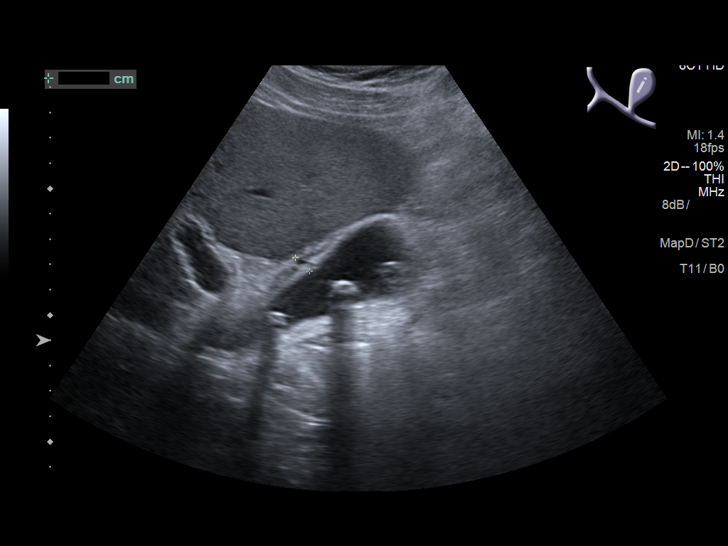
[im 6/32]
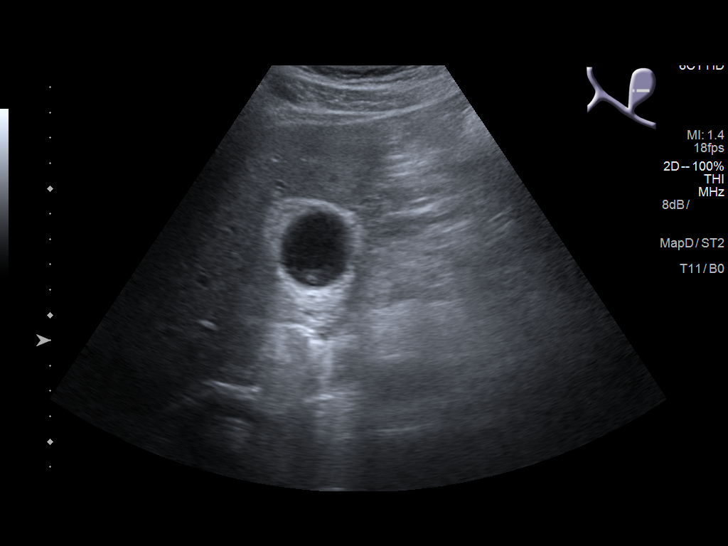
[im 8/32]
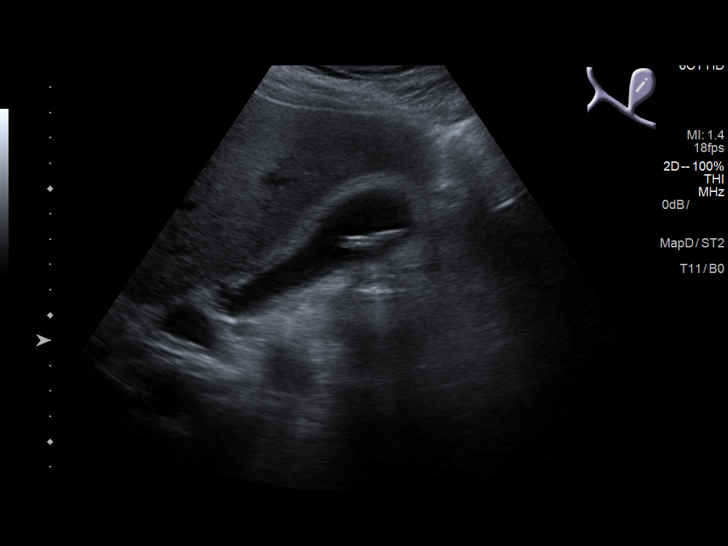
[im 11/32]
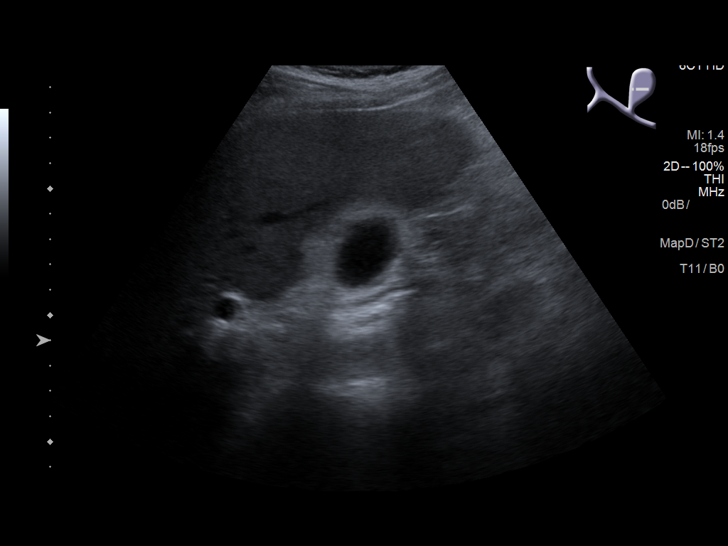
[im 12/32]
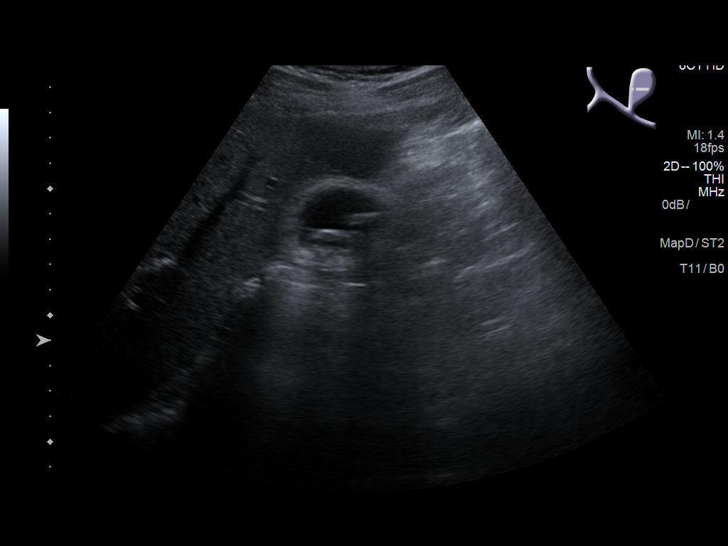
[im 15/32]
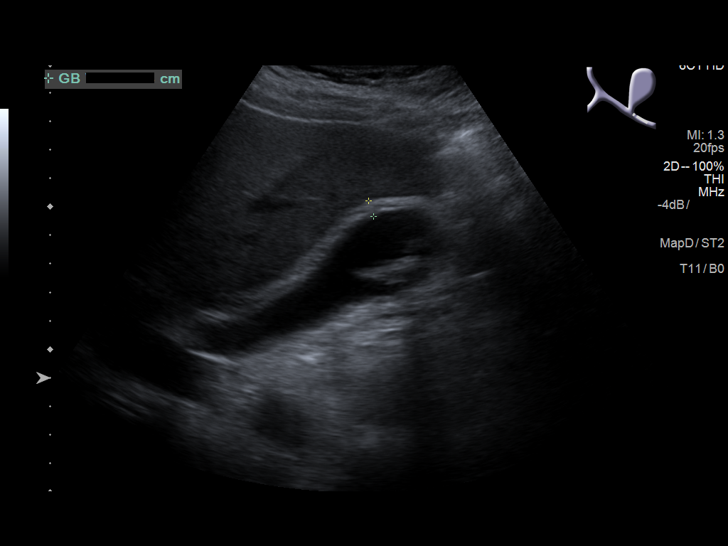
[im 17/32]
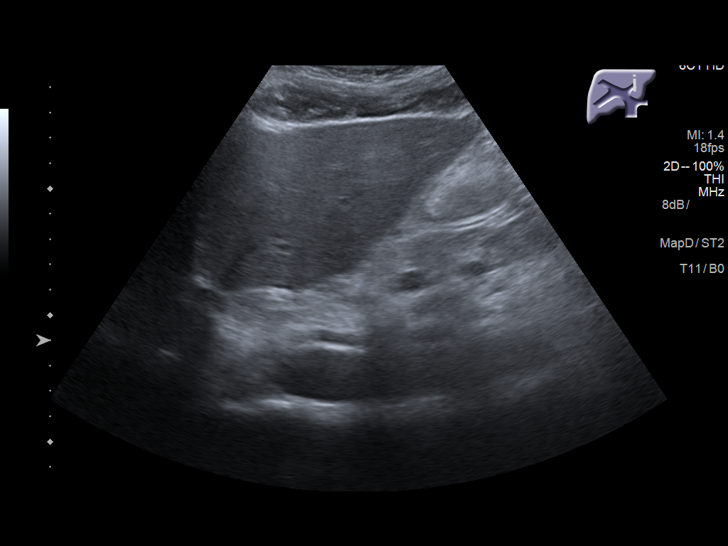
[im 20/32]
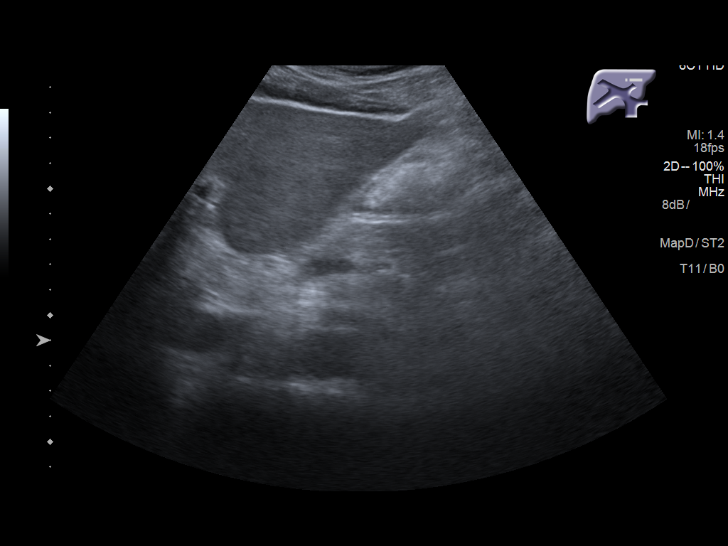
[im 21/32]
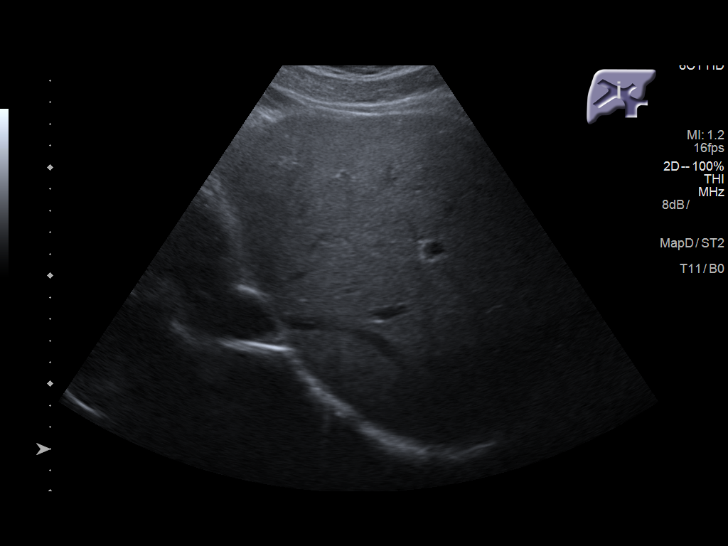
[im 24/32]
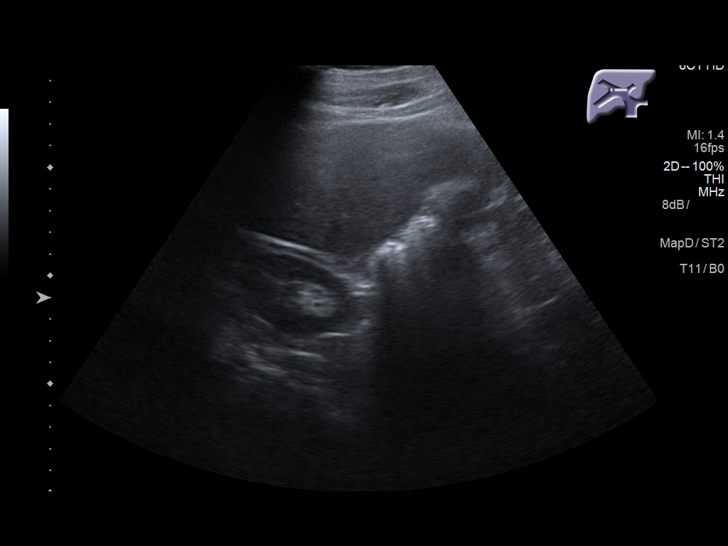
[im 26/32]
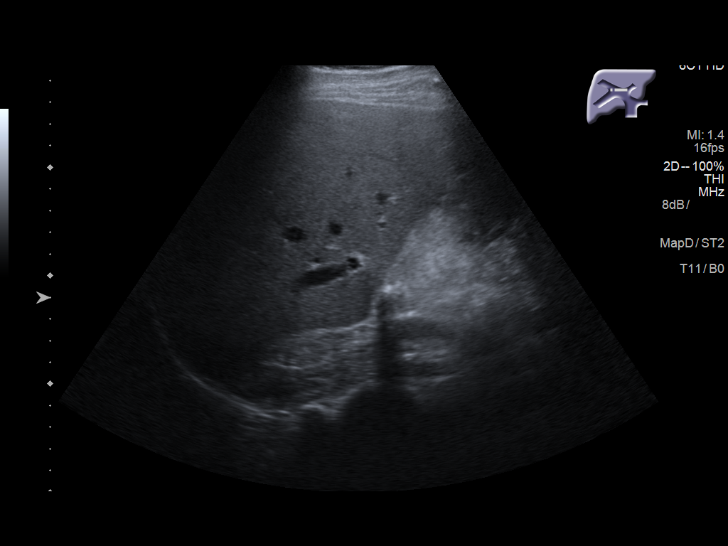
[im 29/32]
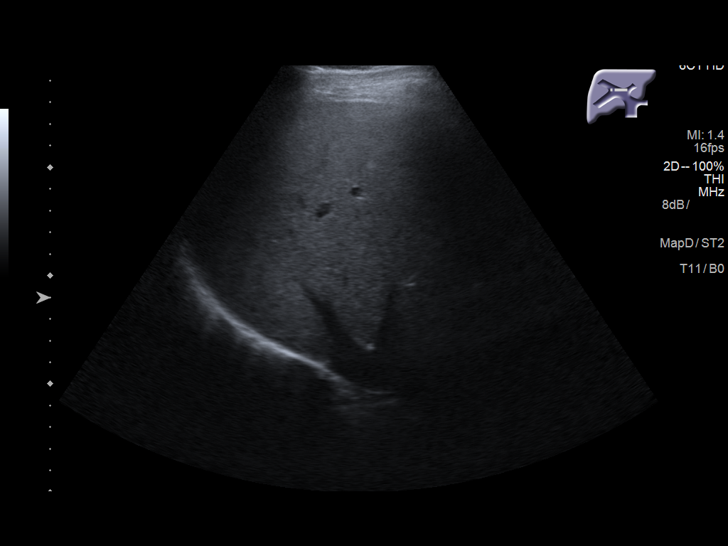
[im 32/32]
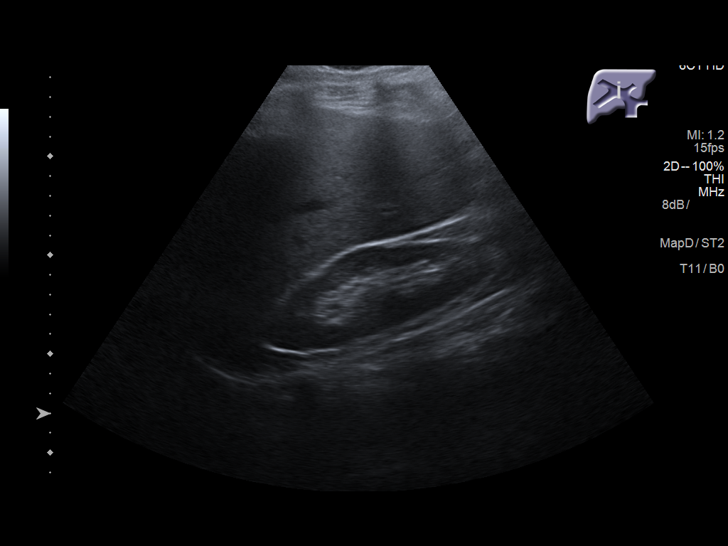

[14 of 25 positions shown; findings below may reference images not displayed]

FINDINGS: Gallbladder:

Multiple stones in the gallbladder, largest measuring about 1.2 cm
diameter. There is gallbladder wall thickening with mild
pericholecystic edema. Murphy's sign is negative.

Common bile duct:

Diameter: 2.7 mm, normal

Liver:

No focal lesion identified. Within normal limits in parenchymal
echogenicity. Portal vein is patent on color Doppler imaging with
normal direction of blood flow towards the liver.
IMPRESSION: Cholelithiasis with thickened gallbladder wall and negative Murphy's
sign. Findings are nonspecific but may be consistent with acute
cholecystitis in the appropriate clinical setting.

## 2017-10-06 ENCOUNTER — Other Ambulatory Visit: Payer: Self-pay | Admitting: Psychiatry

## 2017-10-11 ENCOUNTER — Telehealth: Payer: Self-pay

## 2017-10-11 NOTE — Telephone Encounter (Signed)
received a fax that prior auth was needed for [employer]

## 2017-10-11 NOTE — Telephone Encounter (Signed)
submited a prior auth on [email or web address] [place] is pending review.

## 2017-10-11 NOTE — Telephone Encounter (Signed)
faxed and confirmed to prior auth for the zolpidem er good from 10-11-17 to 10-11-2020 .

## 2017-10-11 NOTE — Telephone Encounter (Signed)
received fax that prior auth was approved for the zolpidem er from 10-11-2017 to  10-11-2020

## 2017-10-19 ENCOUNTER — Ambulatory Visit (INDEPENDENT_AMBULATORY_CARE_PROVIDER_SITE_OTHER): Payer: BC Managed Care – PPO | Admitting: Psychiatry

## 2017-10-19 ENCOUNTER — Encounter: Payer: Self-pay | Admitting: Psychiatry

## 2017-10-19 VITALS — BP 138/89 | HR 85 | Temp 98.7°F | Wt 191.4 lb

## 2017-10-19 DIAGNOSIS — F331 Major depressive disorder, recurrent, moderate: Secondary | ICD-10-CM

## 2017-10-19 DIAGNOSIS — F429 Obsessive-compulsive disorder, unspecified: Secondary | ICD-10-CM | POA: Diagnosis not present

## 2017-10-19 MED ORDER — ZOLPIDEM TARTRATE ER 12.5 MG PO TBCR: 12.5000 mg | EXTENDED_RELEASE_TABLET | Freq: Every evening | ORAL | 2 refills | Status: DC | PRN

## 2017-10-19 MED ORDER — SERTRALINE HCL 100 MG PO TABS: 150.0000 mg | ORAL_TABLET | Freq: Every day | ORAL | 2 refills | Status: DC

## 2017-10-19 NOTE — Progress Notes (Signed)
Patient ID: Syliva OvermanCullen Grant Heinze, male   DOB: 09-Nov-1970, 47 y.o.   MRN: [id number]   Psychiatric Progress Note  Patient Identification: Syliva OvermanCullen Grant Speciale MRN:  [id number] Date of Evaluation:  10/19/2017 Chief Complaint:  Doing better  Visit Diagnosis:    ICD-10-CM   1. MDD (major depressive disorder), recurrent episode, moderate (HCC) F33.1   2. Obsessive-compulsive disorder, unspecified type F42.9    Diagnosis:   Patient Active Problem List   Diagnosis Date Noted  . Cholecystitis [K81.9]   . Hypertension [I10] 07/05/2017  . Insomnia [G47.00] 07/05/2017  . MDD (major depressive disorder), recurrent episode, moderate (HCC) [F33.1] 01/13/2016  . OCD (obsessive compulsive disorder) [F42.9] 01/13/2016  . Anxiety and depression [F41.9, F32.9] 12/01/2015   History of Present Illness:  Patient is a 47 year old Caucasian male who presents today for a follow up of depression and OCD. Patient reports that he is feeling better physically. He has been exercising more. His OCD has improved. States he continues to have some depression. His daughter has graduated high school and is currently not working . Reports that this stresses him out. states his job is the same. He sees his son once in a while and enjoys spending time with him. Denies any suicidal thoughts.  Past Medical History:  Past Medical History:  Diagnosis Date  . Anxiety and depression 12/01/2015  . Bipolar disorder (HCC)   . Cholecystitis   . Hypertension 07/05/2017  . Insomnia 07/05/2017  . MDD (major depressive disorder), recurrent episode, moderate (HCC) 01/13/2016  . OCD (obsessive compulsive disorder) 01/13/2016    Past Surgical History:  Procedure Laterality Date  . CHOLECYSTECTOMY N/A 07/10/2017   Procedure: LAPAROSCOPIC CHOLECYSTECTOMY;  Surgeon: Ancil Linseyavis, Jason Evan, MD;  Location: [facility] ORS;  Service: General;  Laterality: N/A;  . VASECTOMY    . VASECTOMY REVERSAL     Family History:  Family History  Problem Relation  Age of Onset  . Anxiety disorder Mother   . Depression Mother   . Alcohol abuse Father   . Anxiety disorder Sister   . Depression Sister    Social History:   Social History   Socioeconomic History  . Marital status: Married    Spouse name: nancy  . Number of children: 3  . Years of education: Not on file  . Highest education level: Bachelor's degree (e.g., BA, AB, BS)  Social Needs  . Financial resource strain: Not hard at all  . Food insecurity - worry: Never true  . Food insecurity - inability: Never true  . Transportation needs - medical: No  . Transportation needs - non-medical: No  Occupational History  . Not on file  Tobacco Use  . Smoking status: Never Smoker  . Smokeless tobacco: Never Used  Substance and Sexual Activity  . Alcohol use: Yes    Alcohol/week: 13.8 oz    Types: 2 Glasses of wine, 14 Cans of beer, 7 Shots of liquor per week  . Drug use: No  . Sexual activity: Yes    Birth control/protection: None  Other Topics Concern  . Not on file  Social History Narrative  . Not on file   Additional Social History:   Musculoskeletal: Strength & Muscle Tone: within normal limits Gait & Station: normal Patient leans: N/A  Psychiatric Specialty Exam: Medication Refill  Associated symptoms include headaches.  Headache    Depression         Associated symptoms include headaches.  Past medical history includes anxiety.   Anxiety  Review of Systems  Neurological: Positive for headaches.  Psychiatric/Behavioral: Negative for depression.    There were no vitals taken for this visit.There is no height or weight on file to calculate BMI.  General Appearance: Casual  Eye Contact:  Fair  Speech:  Clear and Coherent  Volume:  Normal  Mood: better  Affect:  Congruent   Thought Process:  Coherent  Orientation:  Full (Time, Place, and Person)  Thought Content:  normal  Suicidal Thoughts:  No  Homicidal Thoughts:  No  Memory:  Immediate;    Fair Recent;   Fair Remote;   Fair  Judgement:  Fair  Insight:  Present  Psychomotor Activity:  Normal  Concentration:  Fair  Recall:  [employer] of Knowledge:Fair  Language: Fair  Akathisia:  No  Handed:  Right  AIMS (if indicated):  na  Assets:  Communication Skills Desire for Improvement Housing Physical Health Social Support Vocational/Educational  ADL's:  Intact  Cognition: WNL  Sleep:  improved   Is the patient at risk to self?  No. Has the patient been a risk to self in the past 6 months?  No. Has the patient been a risk to self within the distant past?  No. Is the patient a risk to others?  No. Has the patient been a risk to others in the past 6 months?  No. Has the patient been a risk to others within the distant past?  No.  Allergies:  No Known Allergies Current Medications: Current Outpatient Medications  Medication Sig Dispense Refill  . cyclobenzaprine (FLEXERIL) 10 MG tablet Take 10 mg by mouth 3 (three) times daily as needed for muscle spasms.     Marland Kitchen sertraline (ZOLOFT) 100 MG tablet Take 1.5 tablets (150 mg total) by mouth daily. 45 tablet 2  . valsartan (DIOVAN) 320 MG tablet Take 1 tablet by mouth daily.  11  . zolpidem (AMBIEN CR) 12.5 MG CR tablet Take 1 tablet (12.5 mg total) by mouth at bedtime as needed for sleep. 30 tablet 2   No current facility-administered medications for this visit.     Previous Psychotropic Medications: Yes Abilify- had anxiety, Lithium- legs were weak, Depakote, Trintellix, Duloxetine, Mirtazapine 30mg   Substance Abuse History in the last 12 months:  No.  Consequences of Substance Abuse: Negative  Medical Decision Making:  Review of Psycho-Social Stressors (1), Review and summation of old records (2), Established Problem, Worsening (2) and Review of New Medication or Change in Dosage (2)  Treatment Plan Summary: Medication management  Major Depressive Disorder  Continue Zoloft to 150 mg daily Encouraged daily  exercise and healthy diet. Continue therapy with Felecia Jan. Consider genesight testing if his insurance approves.  OCD  Same as above  Insomnia  Continue Ambien at 12.5 mg at bedtime as needed to help with sleeping.  RTC in 3 months. Call before if necessary.   Brooksie Ellwanger 1/17/20194:18 PM

## 2018-01-11 ENCOUNTER — Ambulatory Visit: Payer: BC Managed Care – PPO | Admitting: Psychiatry

## 2018-01-23 ENCOUNTER — Other Ambulatory Visit: Payer: Self-pay

## 2018-01-23 ENCOUNTER — Encounter: Payer: Self-pay | Admitting: Psychiatry

## 2018-01-23 ENCOUNTER — Ambulatory Visit: Payer: BC Managed Care – PPO | Admitting: Psychiatry

## 2018-01-23 VITALS — BP 137/86 | HR 71 | Temp 98.5°F | Wt 186.6 lb

## 2018-01-23 DIAGNOSIS — F331 Major depressive disorder, recurrent, moderate: Secondary | ICD-10-CM

## 2018-01-23 DIAGNOSIS — F429 Obsessive-compulsive disorder, unspecified: Secondary | ICD-10-CM

## 2018-01-23 MED ORDER — VENLAFAXINE HCL ER 37.5 MG PO CP24: 37.5000 mg | ORAL_CAPSULE | Freq: Every day | ORAL | 2 refills | Status: DC

## 2018-01-23 MED ORDER — ZOLPIDEM TARTRATE ER 12.5 MG PO TBCR: 12.5000 mg | EXTENDED_RELEASE_TABLET | Freq: Every evening | ORAL | 2 refills | Status: DC | PRN

## 2018-01-23 MED ORDER — SERTRALINE HCL 100 MG PO TABS: 150.0000 mg | ORAL_TABLET | Freq: Every day | ORAL | 2 refills | Status: DC

## 2018-01-23 NOTE — Progress Notes (Signed)
Patient ID: Jeremy Boyer, male   DOB: December 21, 1970, 47 y.o.   MRN: [id number]   Psychiatric Progress Note  Patient Identification: Jeremy Boyer MRN:  [id number] Date of Evaluation:  01/23/2018 Chief Complaint:  Doing better Chief Complaint    Follow-up; Medication Refill     Visit Diagnosis:    ICD-10-CM   1. MDD (major depressive disorder), recurrent episode, moderate (HCC) F33.1   2. Obsessive-compulsive disorder, unspecified type F42.9    Diagnosis:   Patient Active Problem List   Diagnosis Date Noted  . Cholecystitis [K81.9]   . Hypertension [I10] 07/05/2017  . Insomnia [G47.00] 07/05/2017  . MDD (major depressive disorder), recurrent episode, moderate (HCC) [F33.1] 01/13/2016  . OCD (obsessive compulsive disorder) [F42.9] 01/13/2016  . Anxiety and depression [F41.9, F32.9] 12/01/2015   History of Present Illness:  Patient is a 47 year old Caucasian male who presents today for a follow up of depression and OCD. Patient reports that he is doing better. They are expecting twins due in July. Enjoying the spring break. Says his mood has mostly been down. He continues to worry about everything. Denies any suicidal thoughts. His children are doing well. We discussed trying effexor since his daughter has responded well.  Reports he has been sleeping well, without any interruptions.  Past Medical History:  Past Medical History:  Diagnosis Date  . Anxiety and depression 12/01/2015  . Bipolar disorder (HCC)   . Cholecystitis   . Hypertension 07/05/2017  . Insomnia 07/05/2017  . MDD (major depressive disorder), recurrent episode, moderate (HCC) 01/13/2016  . OCD (obsessive compulsive disorder) 01/13/2016    Past Surgical History:  Procedure Laterality Date  . CHOLECYSTECTOMY N/A 07/10/2017   Procedure: LAPAROSCOPIC CHOLECYSTECTOMY;  Surgeon: Ancil Linseyavis, Jason Evan, MD;  Location: [facility] ORS;  Service: General;  Laterality: N/A;  . VASECTOMY    . VASECTOMY REVERSAL     Family  History:  Family History  Problem Relation Age of Onset  . Anxiety disorder Mother   . Depression Mother   . Alcohol abuse Father   . Anxiety disorder Sister   . Depression Sister    Social History:   Social History   Socioeconomic History  . Marital status: Married    Spouse name: nancy  . Number of children: 3  . Years of education: Not on file  . Highest education level: Bachelor's degree (e.g., BA, AB, BS)  Occupational History  . Not on file  Social Needs  . Financial resource strain: Not hard at all  . Food insecurity:    Worry: Never true    Inability: Never true  . Transportation needs:    Medical: No    Non-medical: No  Tobacco Use  . Smoking status: Never Smoker  . Smokeless tobacco: Never Used  Substance and Sexual Activity  . Alcohol use: Yes    Alcohol/week: 13.8 oz    Types: 2 Glasses of wine, 14 Cans of beer, 7 Shots of liquor per week  . Drug use: No  . Sexual activity: Yes    Birth control/protection: None  Lifestyle  . Physical activity:    Days per week: 0 days    Minutes per session: 0 min  . Stress: Very much  Relationships  . Social connections:    Talks on phone: Once a week    Gets together: Never    Attends religious service: Never    Active member of club or organization: No    Attends meetings of clubs or organizations: Never  Relationship status: Married  Other Topics Concern  . Not on file  Social History Narrative  . Not on file   Additional Social History:   Musculoskeletal: Strength & Muscle Tone: within normal limits Gait & Station: normal Patient leans: N/A  Psychiatric Specialty Exam: Medication Refill  Associated symptoms include headaches.  Headache    Depression         Associated symptoms include headaches.  Past medical history includes anxiety.   Anxiety       Review of Systems  Neurological: Positive for headaches.  Psychiatric/Behavioral: Negative for depression.    There were no vitals taken  for this visit.There is no height or weight on file to calculate BMI.  General Appearance: Casual  Eye Contact:  Fair  Speech:  Clear and Coherent  Volume:  Normal  Mood: depressed  Affect:  Congruent   Thought Process:  Coherent  Orientation:  Full (Time, Place, and Person)  Thought Content:  normal  Suicidal Thoughts:  No  Homicidal Thoughts:  No  Memory:  Immediate;   Fair Recent;   Fair Remote;   Fair  Judgement:  Fair  Insight:  Present  Psychomotor Activity:  Normal  Concentration:  Fair  Recall:  [employer] of Knowledge:Fair  Language: Fair  Akathisia:  No  Handed:  Right  AIMS (if indicated):  na  Assets:  Communication Skills Desire for Improvement Housing Physical Health Social Support Vocational/Educational  ADL's:  Intact  Cognition: WNL  Sleep:  improved   Is the patient at risk to self?  No. Has the patient been a risk to self in the past 6 months?  No. Has the patient been a risk to self within the distant past?  No. Is the patient a risk to others?  No. Has the patient been a risk to others in the past 6 months?  No. Has the patient been a risk to others within the distant past?  No.  Allergies:  No Known Allergies Current Medications: Current Outpatient Medications  Medication Sig Dispense Refill  . cyclobenzaprine (FLEXERIL) 10 MG tablet Take 10 mg by mouth 3 (three) times daily as needed for muscle spasms.     Marland Kitchen sertraline (ZOLOFT) 100 MG tablet Take 1.5 tablets (150 mg total) by mouth daily. 45 tablet 2  . valsartan (DIOVAN) 320 MG tablet Take 1 tablet by mouth daily.  11  . zolpidem (AMBIEN CR) 12.5 MG CR tablet Take 1 tablet (12.5 mg total) by mouth at bedtime as needed for sleep. 30 tablet 2   No current facility-administered medications for this visit.     Previous Psychotropic Medications: Yes Abilify- had anxiety, Lithium- legs were weak, Depakote, Trintellix, Duloxetine, Mirtazapine 30mg   Substance Abuse History in the last 12 months:   No.  Consequences of Substance Abuse: Negative  Medical Decision Making:  Review of Psycho-Social Stressors (1), Review and summation of old records (2), Established Problem, Worsening (2) and Review of New Medication or Change in Dosage (2)  Treatment Plan Summary: Medication management  Major Depressive Disorder  Continue Zoloft to 150 mg daily Start effexor at 37.5mg  po qd, side effects discussed.  Encouraged daily exercise and healthy diet. Continue therapy with Felecia Jan.  OCD  Same as above  Insomnia  Continue Ambien at 12.5 mg at bedtime as needed to help with sleeping.  RTC in 2 weeks. Call before if necessary.   Kydan Shanholtzer 4/23/201911:37 AM

## 2018-02-08 ENCOUNTER — Other Ambulatory Visit: Payer: Self-pay

## 2018-02-08 ENCOUNTER — Ambulatory Visit (INDEPENDENT_AMBULATORY_CARE_PROVIDER_SITE_OTHER): Payer: BC Managed Care – PPO | Admitting: Psychiatry

## 2018-02-08 ENCOUNTER — Encounter: Payer: Self-pay | Admitting: Psychiatry

## 2018-02-08 VITALS — BP 132/86 | HR 80 | Temp 97.9°F | Wt 189.0 lb

## 2018-02-08 DIAGNOSIS — F331 Major depressive disorder, recurrent, moderate: Secondary | ICD-10-CM

## 2018-02-08 DIAGNOSIS — F429 Obsessive-compulsive disorder, unspecified: Secondary | ICD-10-CM | POA: Diagnosis not present

## 2018-02-08 MED ORDER — SERTRALINE HCL 100 MG PO TABS: 100.0000 mg | ORAL_TABLET | Freq: Every day | ORAL | 2 refills | Status: DC

## 2018-02-08 MED ORDER — VENLAFAXINE HCL ER 75 MG PO CP24: 75.0000 mg | ORAL_CAPSULE | Freq: Every day | ORAL | 1 refills | Status: DC

## 2018-02-08 NOTE — Progress Notes (Signed)
Patient ID: Jeremy Boyer, male   DOB: 1971-04-02, 47 y.o.   MRN: [id number]   Psychiatric Progress Note  Patient Identification: Jeremy Boyer MRN:  [id number] Date of Evaluation:  02/08/2018 Chief Complaint:  Doing the same Chief Complaint    Follow-up; Medication Refill     Visit Diagnosis:    ICD-10-CM   1. MDD (major depressive disorder), recurrent episode, moderate (HCC) F33.1   2. Obsessive-compulsive disorder, unspecified type F42.9    Diagnosis:   Patient Active Problem List   Diagnosis Date Noted  . Cholecystitis [K81.9]   . Hypertension [I10] 07/05/2017  . Insomnia [G47.00] 07/05/2017  . MDD (major depressive disorder), recurrent episode, moderate (HCC) [F33.1] 01/13/2016  . OCD (obsessive compulsive disorder) [F42.9] 01/13/2016  . Anxiety and depression [F41.9, F32.9] 12/01/2015   History of Present Illness:  Patient is a 47 year old Caucasian male who presents today for a follow up of depression and OCD. Patient reports that he was able to tolerate the effexor without any side effects. He reports feeling the same and no change in his mood. He is sleeping well and has a good appetite. Reports his mood being at a 4 on a scale of 1-10 with 10 being his best mood and 1 the worst. Denies any suicidal thoughts. Stressed at school with end of school year nearing.   Reports his wife`s pregnancy is going well.   PHQ 9 of 13, states he has thoughts of wanting to be dead but not active suicidal thoughts.  Past Medical History:  Past Medical History:  Diagnosis Date  . Anxiety and depression 12/01/2015  . Bipolar disorder (HCC)   . Cholecystitis   . Hypertension 07/05/2017  . Insomnia 07/05/2017  . MDD (major depressive disorder), recurrent episode, moderate (HCC) 01/13/2016  . OCD (obsessive compulsive disorder) 01/13/2016    Past Surgical History:  Procedure Laterality Date  . CHOLECYSTECTOMY N/A 07/10/2017   Procedure: LAPAROSCOPIC CHOLECYSTECTOMY;  Surgeon:  Ancil Linsey, MD;  Location: [facility] ORS;  Service: General;  Laterality: N/A;  . VASECTOMY    . VASECTOMY REVERSAL     Family History:  Family History  Problem Relation Age of Onset  . Anxiety disorder Mother   . Depression Mother   . Alcohol abuse Father   . Anxiety disorder Sister   . Depression Sister    Social History:   Social History   Socioeconomic History  . Marital status: Married    Spouse name: nancy  . Number of children: 3  . Years of education: Not on file  . Highest education level: Bachelor's degree (e.g., BA, AB, BS)  Occupational History  . Not on file  Social Needs  . Financial resource strain: Not hard at all  . Food insecurity:    Worry: Never true    Inability: Never true  . Transportation needs:    Medical: No    Non-medical: No  Tobacco Use  . Smoking status: Never Smoker  . Smokeless tobacco: Never Used  Substance and Sexual Activity  . Alcohol use: Yes    Alcohol/week: 13.8 oz    Types: 2 Glasses of wine, 14 Cans of beer, 7 Shots of liquor per week  . Drug use: No  . Sexual activity: Yes    Birth control/protection: None  Lifestyle  . Physical activity:    Days per week: 0 days    Minutes per session: 0 min  . Stress: Very much  Relationships  . Social connections:  Talks on phone: Once a week    Gets together: Never    Attends religious service: Never    Active member of club or organization: No    Attends meetings of clubs or organizations: Never    Relationship status: Married  Other Topics Concern  . Not on file  Social History Narrative  . Not on file   Additional Social History:   Musculoskeletal: Strength & Muscle Tone: within normal limits Gait & Station: normal Patient leans: N/A  Psychiatric Specialty Exam: Medication Refill  Associated symptoms include headaches.  Headache    Depression         Associated symptoms include headaches.  Past medical history includes anxiety.   Anxiety       Review of  Systems  Neurological: Positive for headaches.  Psychiatric/Behavioral: Negative for depression.    Blood pressure 132/86, pulse 80, temperature 97.9 F (36.6 C), temperature source Oral, weight 85.7 kg (189 lb).Body mass index is 27.12 kg/m.  General Appearance: Casual  Eye Contact:  Fair  Speech:  Clear and Coherent  Volume:  Normal  Mood: depressed  Affect:  Congruent   Thought Process:  Coherent  Orientation:  Full (Time, Place, and Person)  Thought Content:  normal  Suicidal Thoughts:  No  Homicidal Thoughts:  No  Memory:  Immediate;   Fair Recent;   Fair Remote;   Fair  Judgement:  Fair  [employer]igh[employer]sent  Psychomotor Activity:  Normal  Concentration:  Fair  Recall:  Fair  Fund of Knowledge:Fair  Language: Fair  Akathisia:  No  Handed:  Right  AIMS (if indicated):  na  Assets:  Communication Skills Desire for Improvement Housing Physical Health Social Support Vocational/Educational  ADL's:  Intact  Cognition: WNL  Sleep:  improved   Is the patient at risk to self?  No. Has the patient been a risk to self in the past 6 months?  No. Has the patient been a risk to self within the distant past?  No. Is the patient a risk to others?  No. Has the patient been a risk to others in the past 6 months?  No. Has the patient been a risk to others within the distant past?  No.  Allergies:  No Known Allergies Current Medications: Current Outpatient Medications  Medication Sig Dispense Refill  . cyclobenzaprine (FLEXERIL) 10 MG tablet Take 10 mg by mouth 3 (three) times daily as needed for muscle spasms.     Marland Kitchen sertraline (ZOLOFT) 100 MG tablet Take 1.5 tablets (150 mg total) by mouth daily. 45 tablet 2  . valsartan (DIOVAN) 320 MG tablet Take 1 tablet by mouth daily.  11  . venlafaxine XR (EFFEXOR-XR) 75 MG 24 hr capsule Take 1 capsule (75 mg total) by mouth daily. 30 capsule 1  . zolpidem (AMBIEN CR) 12.5 MG CR tablet Take 1 tablet (12.5 mg total) by mouth at bedtime as  needed for sleep. 30 tablet 2   No current facility-administered medications for this visit.     Previous Psychotropic Medications: Yes Abilify- had anxiety, Lithium- legs were weak, Depakote, Trintellix, Duloxetine, Mirtazapine   Substance Abuse History in the last 12 months:  No.  Consequences of Substance Abuse: Negative  Medical Decision Making:  Review of Psycho-Social Stressors (1), Review and summation of old records (2), Established Problem, Worsening (2) and Review of New Medication or Change in Dosage (2)  Treatment Plan Summary: Medication management  Major Depressive Disorder  Decrease Zoloft to 100 mg  daily Increase effexor to  po qd, side effects discussed.  Discussed symptoms of serotonin syndrome such as confusion, delirium, agitation, rapid heartbeat and patient recommended to call 911 or go to the nearest emergency room.  Encouraged daily exercise and healthy diet. Continue therapy with Felecia Jan.  OCD  Same as above  Insomnia  Continue Ambien at 12.5 mg at bedtime as needed to help with sleeping.  RTC in 4 weeks. Call before if necessary.   Travon Crochet 5/9/20192:40 PM

## 2018-03-06 ENCOUNTER — Encounter: Payer: Self-pay | Admitting: Psychiatry

## 2018-03-06 ENCOUNTER — Ambulatory Visit: Payer: BC Managed Care – PPO | Admitting: Psychiatry

## 2018-03-06 ENCOUNTER — Other Ambulatory Visit: Payer: Self-pay

## 2018-03-06 VITALS — BP 163/82 | HR 83 | Temp 98.7°F | Wt 186.2 lb

## 2018-03-06 DIAGNOSIS — F331 Major depressive disorder, recurrent, moderate: Secondary | ICD-10-CM

## 2018-03-06 DIAGNOSIS — F429 Obsessive-compulsive disorder, unspecified: Secondary | ICD-10-CM

## 2018-03-06 MED ORDER — VENLAFAXINE HCL ER 150 MG PO CP24: 150.0000 mg | ORAL_CAPSULE | Freq: Every day | ORAL | 1 refills | Status: DC

## 2018-03-06 MED ORDER — SERTRALINE HCL 100 MG PO TABS: ORAL_TABLET | ORAL | 2 refills | Status: DC

## 2018-03-06 NOTE — Progress Notes (Signed)
Patient ID: Jeremy OvermanCullen Grant Boening, male   DOB: 05-19-1971, 47 y.o.   MRN: [id number]   Psychiatric Progress Note  Patient Identification: Jeremy Boyer MRN:  [id number] Date of Evaluation:  03/06/2018 Chief Complaint:  Doing the same  Visit Diagnosis:    ICD-10-CM   1. MDD (major depressive disorder), recurrent episode, moderate (HCC) F33.1   2. Obsessive-compulsive disorder, unspecified type F42.9    Diagnosis:   Patient Active Problem List   Diagnosis Date Noted  . Cholecystitis [K81.9]   . Hypertension [I10] 07/05/2017  . Insomnia [G47.00] 07/05/2017  . MDD (major depressive disorder), recurrent episode, moderate (HCC) [F33.1] 01/13/2016  . OCD (obsessive compulsive disorder) [F42.9] 01/13/2016  . Anxiety and depression [F41.9, F32.9] 12/01/2015   History of Present Illness:  Patient is a 47 year old Caucasian male who presents today for a follow up of depression and OCD. Patient reports that he was able to tolerate the increase in effexor to 75mg   without any side effects. He has tolerated the decrease in zoloft to 100mg  without any problems. He reports feeling the same and no change in his mood. He is sleeping well and has a good appetite.  Reports a lot of stress from school, states today is the last day of regular school and he is looking forward to the summer. States he had a good day today. Reports his wife`s pregnancy is going well.   PHQ 9 of 12, states he has thoughts of wanting to be dead but not active suicidal thoughts.  Past Medical History:  Past Medical History:  Diagnosis Date  . Anxiety and depression 12/01/2015  . Bipolar disorder (HCC)   . Cholecystitis   . Hypertension 07/05/2017  . Insomnia 07/05/2017  . MDD (major depressive disorder), recurrent episode, moderate (HCC) 01/13/2016  . OCD (obsessive compulsive disorder) 01/13/2016    Past Surgical History:  Procedure Laterality Date  . CHOLECYSTECTOMY N/A 07/10/2017   Procedure: LAPAROSCOPIC  CHOLECYSTECTOMY;  Surgeon: Ancil Linseyavis, Jason Evan, MD;  Location: [facility] ORS;  Service: General;  Laterality: N/A;  . VASECTOMY    . VASECTOMY REVERSAL     Family History:  Family History  Problem Relation Age of Onset  . Anxiety disorder Mother   . Depression Mother   . Alcohol abuse Father   . Anxiety disorder Sister   . Depression Sister    Social History:   Social History   Socioeconomic History  . Marital status: Married    Spouse name: nancy  . Number of children: 3  . Years of education: Not on file  . Highest education level: Bachelor's degree (e.g., BA, AB, BS)  Occupational History  . Not on file  Social Needs  . Financial resource strain: Not hard at all  . Food insecurity:    Worry: Never true    Inability: Never true  . Transportation needs:    Medical: No    Non-medical: No  Tobacco Use  . Smoking status: Never Smoker  . Smokeless tobacco: Never Used  Substance and Sexual Activity  . Alcohol use: Yes    Alcohol/week: 13.8 oz    Types: 2 Glasses of wine, 14 Cans of beer, 7 Shots of liquor per week  . Drug use: No  . Sexual activity: Yes    Birth control/protection: None  Lifestyle  . Physical activity:    Days per week: 0 days    Minutes per session: 0 min  . Stress: Very much  Relationships  . Social connections:  Talks on phone: Once a week    Gets together: Never    Attends religious service: Never    Active member of club or organization: No    Attends meetings of clubs or organizations: Never    Relationship status: Married  Other Topics Concern  . Not on file  Social History Narrative  . Not on file   Additional Social History:   Musculoskeletal: Strength & Muscle Tone: within normal limits Gait & Station: normal Patient leans: N/A  Psychiatric Specialty Exam: Medication Refill  Associated symptoms include headaches.  Headache    Depression         Associated symptoms include headaches.  Past medical history includes anxiety.    Anxiety       Review of Systems  Neurological: Positive for headaches.  Psychiatric/Behavioral: Negative for depression.    There were no vitals taken for this visit.There is no height or weight on file to calculate BMI.  General Appearance: Casual  Eye Contact:  Fair  Speech:  Clear and Coherent  Volume:  Normal  Mood: depressed  Affect:  Congruent   Thought Process:  Coherent  Orientation:  Full (Time, Place, and Person)  Thought Content:  normal  Suicidal Thoughts:  No  Homicidal Thoughts:  No  Memory:  Immediate;   Fair Recent;   Fair Remote;   Fair  Judgement:  Fair  Insight:  Present  Psychomotor Activity:  Normal  Concentration:  Fair  Recall:  [employer] of Knowledge:Fair  Language: Fair  Akathisia:  No  Handed:  Right  AIMS (if indicated):  na  Assets:  Communication Skills Desire for Improvement Housing Physical Health Social Support Vocational/Educational  ADL's:  Intact  Cognition: WNL  Sleep:  improved   Is the patient at risk to self?  No. Has the patient been a risk to self in the past 6 months?  No. Has the patient been a risk to self within the distant past?  No. Is the patient a risk to others?  No. Has the patient been a risk to others in the past 6 months?  No. Has the patient been a risk to others within the distant past?  No.  Allergies:  No Known Allergies Current Medications: Current Outpatient Medications  Medication Sig Dispense Refill  . cyclobenzaprine (FLEXERIL) 10 MG tablet Take 10 mg by mouth 3 (three) times daily as needed for muscle spasms.     Marland Kitchen sertraline (ZOLOFT) 100 MG tablet Take 1 tablet (100 mg total) by mouth daily. 45 tablet 2  . valsartan (DIOVAN) 320 MG tablet Take 1 tablet by mouth daily.  11  . venlafaxine XR (EFFEXOR-XR) 75 MG 24 hr capsule Take 1 capsule (75 mg total) by mouth daily. 30 capsule 1  . zolpidem (AMBIEN CR) 12.5 MG CR tablet Take 1 tablet (12.5 mg total) by mouth at bedtime as needed for sleep.  30 tablet 2   No current facility-administered medications for this visit.     Previous Psychotropic Medications: Yes Abilify- had anxiety, Lithium- legs were weak, Depakote, Trintellix, Duloxetine, Mirtazapine 30mg   Substance Abuse History in the last 12 months:  No.  Consequences of Substance Abuse: Negative  Medical Decision Making:  Review of Psycho-Social Stressors (1), Review and summation of old records (2), Established Problem, Worsening (2) and Review of New Medication or Change in Dosage (2)  Treatment Plan Summary: Medication management  Major Depressive Disorder  Decrease Zoloft to 50 mg daily for 2 weeks and  then stop. Increase effexor to 150mg  po qd, side effects discussed.  Discussed symptoms of serotonin syndrome such as confusion, delirium, agitation, rapid heartbeat and patient recommended to call 911 or go to the nearest emergency room.  Encouraged daily exercise and healthy diet. Continue therapy with Felecia Jan.  OCD  Same as above  Insomnia  Continue Ambien at 12.5 mg at bedtime as needed to help with sleeping.  RTC in 4 weeks. Call before if necessary.   Nickayla Mcinnis 6/4/20193:25 PM

## 2018-04-10 ENCOUNTER — Other Ambulatory Visit: Payer: Self-pay

## 2018-04-10 ENCOUNTER — Ambulatory Visit (INDEPENDENT_AMBULATORY_CARE_PROVIDER_SITE_OTHER): Payer: BC Managed Care – PPO | Admitting: Psychiatry

## 2018-04-10 ENCOUNTER — Encounter: Payer: Self-pay | Admitting: Psychiatry

## 2018-04-10 ENCOUNTER — Ambulatory Visit: Payer: Self-pay | Admitting: Psychiatry

## 2018-04-10 VITALS — BP 139/75 | HR 86 | Temp 98.9°F | Wt 189.0 lb

## 2018-04-10 DIAGNOSIS — F331 Major depressive disorder, recurrent, moderate: Secondary | ICD-10-CM | POA: Diagnosis not present

## 2018-04-10 DIAGNOSIS — F429 Obsessive-compulsive disorder, unspecified: Secondary | ICD-10-CM

## 2018-04-10 MED ORDER — VENLAFAXINE HCL ER 150 MG PO CP24: 150.0000 mg | ORAL_CAPSULE | Freq: Every day | ORAL | 2 refills | Status: DC

## 2018-04-10 MED ORDER — ZOLPIDEM TARTRATE ER 12.5 MG PO TBCR: 12.5000 mg | EXTENDED_RELEASE_TABLET | Freq: Every evening | ORAL | 2 refills | Status: DC | PRN

## 2018-04-10 NOTE — Progress Notes (Signed)
Patient ID: Jeremy OvermanCullen Grant Boyer, male   DOB: 04-Dec-1970, 47 y.o.   MRN: [id number]   Psychiatric Progress Note  Patient Identification: Jeremy OvermanCullen Grant Boyer MRN:  [id number] Date of Evaluation:  04/10/2018 Chief Complaint:  Doing the same Chief Complaint    Follow-up; Medication Refill     Visit Diagnosis:    ICD-10-CM   1. MDD (major depressive disorder), recurrent episode, moderate (HCC) F33.1   2. Obsessive-compulsive disorder, unspecified type F42.9    Diagnosis:   Patient Active Problem List   Diagnosis Date Noted  . Cholecystitis [K81.9]   . Hypertension [I10] 07/05/2017  . Insomnia [G47.00] 07/05/2017  . MDD (major depressive disorder), recurrent episode, moderate (HCC) [F33.1] 01/13/2016  . OCD (obsessive compulsive disorder) [F42.9] 01/13/2016  . Anxiety and depression [F41.9, F32.9] 12/01/2015   History of Present Illness:  Patient is a 47 year old Caucasian male who presents today for a follow up of depression and OCD. Patient reports that he was able to tolerate the increase in effexor to 150mg   without any side effects. He has tolerated the discontinuation of zoloft 2 weeks ago. States he has not seen an improvement in his mood nor any side effects. His daughter has had some health issues and wife had to be taken to the hospital for contractions. Reports feeling exhausted. Denies any suicidal thoughts.   Past Medical History:  Past Medical History:  Diagnosis Date  . Anxiety and depression 12/01/2015  . Bipolar disorder (HCC)   . Cholecystitis   . Hypertension 07/05/2017  . Insomnia 07/05/2017  . MDD (major depressive disorder), recurrent episode, moderate (HCC) 01/13/2016  . OCD (obsessive compulsive disorder) 01/13/2016    Past Surgical History:  Procedure Laterality Date  . CHOLECYSTECTOMY N/A 07/10/2017   Procedure: LAPAROSCOPIC CHOLECYSTECTOMY;  Surgeon: Ancil Linseyavis, Jason Evan, MD;  Location: [facility] OR[facility];  Service: General;  Laterality: N/A;  . VASECTOMY    . VASECTOMY  REVERSAL     Family History:  Family History  Problem Relation Age of Onset  . Anxiety disorder Mother   . Depression Mother   . Alcohol abuse Father   . Anxiety disorder Sister   . Depression Sister    Social History:   Social History   Socioeconomic History  . Marital status: Married    Spouse name: Jeremy Boyer  . Number of children: 3  . Years of education: Not on file  . Highest education level: Bachelor's degree (e.g., BA, AB, BS)  Occupational History  . Not on file  Social Needs  . Financial resource strain: Not hard at all  . Food insecurity:    Worry: Never true    Inability: Never true  . Transportation needs:    Medical: No    Non-medical: No  Tobacco Use  . Smoking status: Never Smoker  . Smokeless tobacco: Never Used  Substance and Sexual Activity  . Alcohol use: Yes    Alcohol/week: 13.8 oz    Types: 2 Glasses of wine, 14 Cans of beer, 7 Shots of liquor per week  . Drug use: No  . Sexual activity: Yes    Birth control/protection: None  Lifestyle  . Physical activity:    Days per week: 0 days    Minutes per session: 0 min  . Stress: Very much  Relationships  . Social connections:    Talks on phone: Once a week    Gets together: Never    Attends religious service: Never    Active member of club or organization: No  Attends meetings of clubs or organizations: Never    Relationship status: Married  Other Topics Concern  . Not on file  Social History Narrative  . Not on file   Additional Social History:   Musculoskeletal: Strength & Muscle Tone: within normal limits Gait & Station: normal Patient leans: N/A  Psychiatric Specialty Exam: Medication Refill  Associated symptoms include headaches.  Headache    Depression         Associated symptoms include headaches.  Past medical history includes anxiety.   Anxiety       Review of Systems  Neurological: Positive for headaches.  Psychiatric/Behavioral: Negative for depression.    Blood  pressure 139/75, pulse 86, temperature 98.9 F (37.2 C), temperature source Oral, weight 85.7 kg (189 lb).Body mass index is 27.12 kg/m.  General Appearance: Casual  Eye Contact:  Fair  Speech:  Clear and Coherent  Volume:  Normal  Mood: the same  Affect:  Congruent   Thought Process:  Coherent  Orientation:  Full (Time, Place, and Person)  Thought Content:  normal  Suicidal Thoughts:  No  Homicidal Thoughts:  No  Memory:  Immediate;   Fair Recent;   Fair Remote;   Fair  Judgement:  Fair  Insight:  Present  Psychomotor Activity:  Normal  Concentration:  Fair  Recall:  [employer] of Knowledge:Fair  Language: Fair  Akathisia:  No  Handed:  Right  AIMS (if indicated):  na  Assets:  Communication Skills Desire for Improvement Housing Physical Health Social Support Vocational/Educational  ADL's:  Intact  Cognition: WNL  Sleep:  improved   Is the patient at risk to self?  No. Has the patient been a risk to self in the past 6 months?  No. Has the patient been a risk to self within the distant past?  No. Is the patient a risk to others?  No. Has the patient been a risk to others in the past 6 months?  No. Has the patient been a risk to others within the distant past?  No.  Allergies:  No Known Allergies Current Medications: Current Outpatient Medications  Medication Sig Dispense Refill  . cyclobenzaprine (FLEXERIL) 10 MG tablet Take 10 mg by mouth 3 (three) times daily as needed for muscle spasms.     . valsartan (DIOVAN) 320 MG tablet Take 1 tablet by mouth daily.  11  . venlafaxine XR (EFFEXOR-XR) 150 MG 24 hr capsule Take 1 capsule (150 mg total) by mouth daily. 30 capsule 2  . zolpidem (AMBIEN CR) 12.5 MG CR tablet Take 1 tablet (12.5 mg total) by mouth at bedtime as needed for sleep. 30 tablet 2   No current facility-administered medications for this visit.     Previous Psychotropic Medications: Yes Abilify- had anxiety, Lithium- legs were weak, Depakote,  Trintellix, Duloxetine, Mirtazapine 30mg   Substance Abuse History in the last 12 months:  No.  Consequences of Substance Abuse: Negative  Medical Decision Making:  Review of Psycho-Social Stressors (1), Review and summation of old records (2), Established Problem, Worsening (2) and Review of New Medication or Change in Dosage (2)  Treatment Plan Summary: Medication management  Major Depressive Disorder  Continue effexor at 150mg  po qd, side effects discussed.   Encouraged daily exercise and healthy diet. Continue therapy with Felecia Jan.  OCD  Same as above  Insomnia  Continue Ambien at 12.5 mg at bedtime as needed to help with sleeping.  RTC in 8 weeks. Call before if necessary.  Tangy Drozdowski 7/9/20193:18 PM

## 2018-06-19 ENCOUNTER — Other Ambulatory Visit: Payer: Self-pay

## 2018-06-19 ENCOUNTER — Encounter: Payer: Self-pay | Admitting: Psychiatry

## 2018-06-19 ENCOUNTER — Ambulatory Visit: Payer: BC Managed Care – PPO | Admitting: Psychiatry

## 2018-06-19 VITALS — BP 143/91 | HR 83 | Temp 98.4°F | Wt 192.6 lb

## 2018-06-19 DIAGNOSIS — F429 Obsessive-compulsive disorder, unspecified: Secondary | ICD-10-CM | POA: Diagnosis not present

## 2018-06-19 DIAGNOSIS — F331 Major depressive disorder, recurrent, moderate: Secondary | ICD-10-CM

## 2018-06-19 MED ORDER — VENLAFAXINE HCL ER 150 MG PO CP24: 150.0000 mg | ORAL_CAPSULE | Freq: Every day | ORAL | 2 refills | Status: DC

## 2018-06-19 NOTE — Progress Notes (Signed)
Patient ID: Jeremy OvermanCullen Grant Boyer, male   DOB: 20-Apr-1971, 47 y.o.   MRN: [id number]   Psychiatric Progress Note  Patient Identification: Jeremy Boyer MRN:  [id number] Date of Evaluation:  06/19/2018 Chief Complaint:  Doing the same Chief Complaint    Follow-up; Medication Refill     Visit Diagnosis:    ICD-10-CM   1. MDD (major depressive disorder), recurrent episode, moderate (HCC) F33.1   2. Obsessive-compulsive disorder, unspecified type F42.9    Diagnosis:   Patient Active Problem List   Diagnosis Date Noted  . Cholecystitis [K81.9]   . Hypertension [I10] 07/05/2017  . Insomnia [G47.00] 07/05/2017  . MDD (major depressive disorder), recurrent episode, moderate (HCC) [F33.1] 01/13/2016  . OCD (obsessive compulsive disorder) [F42.9] 01/13/2016  . Anxiety and depression [F41.9, F32.9] 12/01/2015   History of Present Illness:  Patient is a 47 year old Caucasian male who presents today for a follow up of depression and OCD. Patient reports that his twins were born in July and are doing well. He has been very busy and not sleeping well.  States he has not been able to rest well and feels his mood is all over the place. His job is good, students are more cooperative and school has been more organized. Denies any suicidal thoughts.  Patient gets a prescription for xanax at 1mg  daily from his primary care physician.   Past Medical History:  Past Medical History:  Diagnosis Date  . Anxiety and depression 12/01/2015  . Bipolar disorder (HCC)   . Cholecystitis   . Hypertension 07/05/2017  . Insomnia 07/05/2017  . MDD (major depressive disorder), recurrent episode, moderate (HCC) 01/13/2016  . OCD (obsessive compulsive disorder) 01/13/2016    Past Surgical History:  Procedure Laterality Date  . CHOLECYSTECTOMY N/A 07/10/2017   Procedure: LAPAROSCOPIC CHOLECYSTECTOMY;  Surgeon: Ancil Linseyavis, Jason Evan, MD;  Location: [facility] OR[facility];  Service: General;  Laterality: N/A;  . VASECTOMY    .  VASECTOMY REVERSAL     Family History:  Family History  Problem Relation Age of Onset  . Anxiety disorder Mother   . Depression Mother   . Alcohol abuse Father   . Anxiety disorder Sister   . Depression Sister    Social History:   Social History   Socioeconomic History  . Marital status: Married    Spouse name: nancy  . Number of children: 3  . Years of education: Not on file  . Highest education level: Bachelor's degree (e.g., BA, AB, BS)  Occupational History  . Not on file  Social Needs  . Financial resource strain: Not hard at all  . Food insecurity:    Worry: Never true    Inability: Never true  . Transportation needs:    Medical: No    Non-medical: No  Tobacco Use  . Smoking status: Never Smoker  . Smokeless tobacco: Never Used  Substance and Sexual Activity  . Alcohol use: Yes    Alcohol/week: 23.0 standard drinks    Types: 2 Glasses of wine, 14 Cans of beer, 7 Shots of liquor per week  . Drug use: No  . Sexual activity: Yes    Birth control/protection: None  Lifestyle  . Physical activity:    Days per week: 0 days    Minutes per session: 0 min  . Stress: Very much  Relationships  . Social connections:    Talks on phone: Once a week    Gets together: Never    Attends religious service: Never  Active member of club or organization: No    Attends meetings of clubs or organizations: Never    Relationship status: Married  Other Topics Concern  . Not on file  Social History Narrative  . Not on file   Additional Social History:   Musculoskeletal: Strength & Muscle Tone: within normal limits Gait & Station: normal Patient leans: N/A  Psychiatric Specialty Exam: Medication Refill  Associated symptoms include headaches.  Headache    Depression         Associated symptoms include headaches.  Past medical history includes anxiety.   Anxiety       Review of Systems  Neurological: Positive for headaches.  Psychiatric/Behavioral: Negative for  depression.    Blood pressure (!) 143/91, pulse 83, temperature 98.4 F (36.9 C), temperature source Oral, weight 87.4 kg.Body mass index is 27.64 kg/m.  General Appearance: Casual  Eye Contact:  Fair  Speech:  Clear and Coherent  Volume:  Normal  Mood: ok  Affect:  Congruent   Thought Process:  Coherent  Orientation:  Full (Time, Place, and Person)  Thought Content:  normal  Suicidal Thoughts:  No  Homicidal Thoughts:  No  Memory:  Immediate;   Fair Recent;   Fair Remote;   Fair  Judgement:  Fair  Insight:  Present  Psychomotor Activity:  Normal  Concentration:  Fair  Recall:  [employer] of Knowledge:Fair  Language: Fair  Akathisia:  No  Handed:  Right  AIMS (if indicated):  na  Assets:  Communication Skills Desire for Improvement Housing Physical Health Social Support Vocational/Educational  ADL's:  Intact  Cognition: WNL  Sleep: not sleeping well.   Is the patient at risk to self?  No. Has the patient been a risk to self in the past 6 months?  No. Has the patient been a risk to self within the distant past?  No. Is the patient a risk to others?  No. Has the patient been a risk to others in the past 6 months?  No. Has the patient been a risk to others within the distant past?  No.  Allergies:  No Known Allergies Current Medications: Current Outpatient Medications  Medication Sig Dispense Refill  . cyclobenzaprine (FLEXERIL) 10 MG tablet Take 10 mg by mouth 3 (three) times daily as needed for muscle spasms.     . valsartan (DIOVAN) 320 MG tablet Take 1 tablet by mouth daily.  11  . venlafaxine XR (EFFEXOR-XR) 150 MG 24 hr capsule Take 1 capsule (150 mg total) by mouth daily. 30 capsule 2  . zolpidem (AMBIEN CR) 12.5 MG CR tablet Take 1 tablet (12.5 mg total) by mouth at bedtime as needed for sleep. 30 tablet 2   No current facility-administered medications for this visit.     Previous Psychotropic Medications: Yes Abilify- had anxiety, Lithium- legs were  weak, Depakote, Trintellix, Duloxetine, Mirtazapine 30mg   Substance Abuse History in the last 12 months:  No.  Consequences of Substance Abuse: Negative  Medical Decision Making:  Review of Psycho-Social Stressors (1), Review and summation of old records (2), Established Problem, Worsening (2) and Review of New Medication or Change in Dosage (2)  Treatment Plan Summary: Medication management  Major Depressive Disorder  Continue effexor at 150mg  po qd, side effects discussed.   Encouraged daily exercise and healthy diet. Continue therapy with Felecia Jan.  OCD  Same as above  Insomnia  Continue Ambien at 12.5 mg at bedtime as needed to help with sleeping.  Recommend  taper off the xanax at 50 percent decrease in the dose for 2 weeks, his PCP will continue the prescription since it has been started by him.  RTC in 12 weeks. Call before if necessary.   Jett Fukuda 9/17/20192:40 PM

## 2018-09-18 ENCOUNTER — Ambulatory Visit: Payer: BC Managed Care – PPO | Admitting: Psychiatry

## 2018-10-23 ENCOUNTER — Encounter: Payer: Self-pay | Admitting: Psychiatry

## 2018-10-23 ENCOUNTER — Ambulatory Visit: Payer: BC Managed Care – PPO | Admitting: Psychiatry

## 2018-10-23 ENCOUNTER — Other Ambulatory Visit: Payer: Self-pay | Admitting: Psychiatry

## 2018-10-23 ENCOUNTER — Other Ambulatory Visit: Payer: Self-pay

## 2018-10-23 VITALS — BP 136/92 | HR 96 | Temp 97.9°F | Wt 193.0 lb

## 2018-10-23 DIAGNOSIS — F429 Obsessive-compulsive disorder, unspecified: Secondary | ICD-10-CM

## 2018-10-23 DIAGNOSIS — F331 Major depressive disorder, recurrent, moderate: Secondary | ICD-10-CM | POA: Diagnosis not present

## 2018-10-23 MED ORDER — VENLAFAXINE HCL ER 37.5 MG PO CP24: 37.5000 mg | ORAL_CAPSULE | Freq: Every day | ORAL | 1 refills | Status: DC

## 2018-10-23 MED ORDER — VENLAFAXINE HCL ER 150 MG PO CP24: 150.0000 mg | ORAL_CAPSULE | Freq: Every day | ORAL | 2 refills | Status: DC

## 2018-10-23 MED ORDER — RISPERIDONE 0.5 MG PO TABS: 0.5000 mg | ORAL_TABLET | Freq: Every day | ORAL | 0 refills | Status: DC

## 2018-10-23 NOTE — Progress Notes (Signed)
BH MD OP Progress Note  10/23/2018 5:43 PM Jeremy Boyer  MRN:  [id number]  Chief Complaint: ' I am here for follow up."  Chief Complaint    Follow-up; Medication Refill     HPI: Jeremy Boyer is a 48 year old Caucasian male, married, employed, lives in [place], has a history of depression, OCD, HTN,presented to the clinic today for a follow-up visit.  Patient used to follow-up with Dr. Daleen Boavi here in clinic.  This is his first visit with Clinical research associatewriter.  [facility] Dr. Daleen Boavi dated 06/19/2018.  Patient today reports he continues to struggle with depressive symptoms.  He reports sadness, lack of energy, inability to concentrate.  He also has racing thoughts and anxiety symptoms on a regular basis.  He wonders whether he has ADHD.  He also struggles with a lot of OCD symptoms like checking, counting, the need for symmetry and so on.  He reports he is at a better place with regards to her OCD symptoms compared to where he was before.  He is compliant on Effexor as prescribed.  He reports the Effexor is helpful to some extent.  Discussed readjusting his Effexor dosage today.  Also discussed adding risperidone as a mood stabilizer to help with his mood symptoms.  He agrees with plan.  Patient is also interested in GeneSight testing today and will order the same today. Visit Diagnosis:    ICD-10-CM   1. MDD (major depressive disorder), recurrent episode, moderate (HCC) F33.1 risperiDONE (RISPERDAL) 0.5 MG tablet    venlafaxine XR (EFFEXOR-XR) 37.5 MG 24 hr capsule    venlafaxine XR (EFFEXOR-XR) 150 MG 24 hr capsule  2. Obsessive-compulsive disorder, unspecified type F42.9 risperiDONE (RISPERDAL) 0.5 MG tablet    venlafaxine XR (EFFEXOR-XR) 37.5 MG 24 hr capsule    venlafaxine XR (EFFEXOR-XR) 150 MG 24 hr capsule    Past Psychiatric History: She used to follow-up with Dr. Daleen Boavi here in clinic.  He denies any suicide attempts.  He denies inpatient mental health  admissions.  He reports past trials of medications like Abilify- made him anxious, lithium-Trintellix, Depakote, mirtazapine, duloxetine.  Past Medical History:  Past Medical History:  Diagnosis Date  . Anxiety and depression 12/01/2015  . Bipolar disorder (HCC)   . Cholecystitis   . Hypertension 07/05/2017  . Insomnia 07/05/2017  . MDD (major depressive disorder), recurrent episode, moderate (HCC) 01/13/2016  . OCD (obsessive compulsive disorder) 01/13/2016    Past Surgical History:  Procedure Laterality Date  . CHOLECYSTECTOMY N/A 07/10/2017   Procedure: LAPAROSCOPIC CHOLECYSTECTOMY;  Surgeon: Ancil Linseyavis, Jason Evan, MD;  Location: [facility] ORS;  Service: General;  Laterality: N/A;  . VASECTOMY    . VASECTOMY REVERSAL      Family Psychiatric History: Mother-anxiety, depression, father-alcohol abuse, sister-anxiety and depression, daughter- mental health problems.  Family History:  Family History  Problem Relation Age of Onset  . Anxiety disorder Mother   . Depression Mother   . Alcohol abuse Father   . Anxiety disorder Sister   . Depression Sister     Social History: Pt is married, [place].  He has twin newborn babies.  He also has a daughter who is 46 years old.  He works as a Editor, commissioningmath teacher. Social History   Socioeconomic History  . Marital status: Married    Spouse name: nancy  . Number of children: 3  . Years of education: Not on file  . Highest education level: Bachelor's degree (  e.g., BA, AB, BS)  Occupational History  . Not on file  Social Needs  . Financial resource strain: Not hard at all  . Food insecurity:    Worry: Never true    Inability: Never true  . Transportation needs:    Medical: No    Non-medical: No  Tobacco Use  . Smoking status: Never Smoker  . Smokeless tobacco: Never Used  Substance and Sexual Activity  . Alcohol use: Yes    Alcohol/week: 23.0 standard drinks    Types: 2 Glasses of wine, 14 Cans of beer, 7 Shots of  liquor per week  . Drug use: No  . Sexual activity: Yes    Birth control/protection: None  Lifestyle  . Physical activity:    Days per week: 0 days    Minutes per session: 0 min  . Stress: Very much  Relationships  . Social connections:    Talks on phone: Once a week    Gets together: Never    Attends religious service: Never    Active member of club or organization: No    Attends meetings of clubs or organizations: Never    Relationship status: Married  Other Topics Concern  . Not on file  Social History Narrative  . Not on file    Allergies: No Known Allergies  Metabolic Disorder Labs: No results found for: HGBA1C, MPG No results found for: PROLACTIN No results found for: CHOL, TRIG, HDL, CHOLHDL, VLDL, LDLCALC No results found for: TSH  Therapeutic Level Labs: No results found for: LITHIUM No results found for: VALPROATE No components found for:  CBMZ  Current Medications: Current Outpatient Medications  Medication Sig Dispense Refill  . cyclobenzaprine (FLEXERIL) 10 MG tablet Take 10 mg by mouth 3 (three) times daily as needed for muscle spasms.     . risperiDONE (RISPERDAL) 0.5 MG tablet Take 1 tablet (0.5 mg total) by mouth at bedtime. mood 30 tablet 0  . valsartan (DIOVAN) 320 MG tablet Take 1 tablet by mouth daily.  11  . venlafaxine XR (EFFEXOR-XR) 150 MG 24 hr capsule Take 1 capsule (150 mg total) by mouth daily. To be combined with 37.5 mg 30 capsule 2  . venlafaxine XR (EFFEXOR-XR) 37.5 MG 24 hr capsule Take 1 capsule (37.5 mg total) by mouth daily with breakfast. To be combined with 150 mg 30 capsule 1  . zolpidem (AMBIEN CR) 12.5 MG CR tablet Take 1 tablet (12.5 mg total) by mouth at bedtime as needed for sleep. 30 tablet 2   No current facility-administered medications for this visit.      Musculoskeletal: Strength & Muscle Tone: within normal limits Gait & Station: normal Patient leans: N/A  Psychiatric Specialty Exam: Review of Systems   Psychiatric/Behavioral: Positive for depression. The patient is nervous/anxious.   All other systems reviewed and are negative.   Blood pressure (!) 136/92, pulse 96, temperature 97.9 F (36.6 C), temperature source Oral, weight 193 lb (87.5 kg).Body mass index is 27.69 kg/m.  General Appearance: Casual  Eye Contact:  Fair  Speech:  Clear and Coherent  Volume:  Normal  Mood:  Anxious and Depressed  Affect:  Congruent  Thought Process:  Goal Directed and Descriptions of Associations: Intact  Orientation:  Full (Time, Place, and Person)  Thought Content: Logical   Suicidal Thoughts:  No  Homicidal Thoughts:  No  Memory:  Immediate;   Fair Recent;   Fair Remote;   Fair  Judgement:  Fair  Insight:  Fair  Psychomotor Activity:  Normal  Concentration:  Concentration: Fair and Attention Span: Fair  Recall:  [employer] of Knowledge: Fair  Language: Fair  Akathisia:  No  Handed:  Right  AIMS (if indicated): denies tremors, rigidity  Assets:  Communication Skills Desire for Improvement Social Support  ADL's:  Intact  Cognition: WNL  Sleep:  Poor due to having twin newborns   Screenings:   Assessment and Plan: Pritesh is a 48 year old Caucasian male who is married, employed, lives in [place], has a history of OCD and depression, presented to clinic today for a follow-up visit.  Patient continues to struggle with mood symptoms.  Will make medication readjustment.  Plan as noted below.  Plan For OCD- some improvement Increase Effexor extended release to 187.5 mg p.o. daily Add risperidone 0.5 mg p.o. nightly  For MDD- unstable Increase Effexor as discussed above. Add risperidone 0.5 mg p.o. nightly .  For insomnia- unstable Ambien as prescribed.  Will order GeneSight testing today.  I have reviewed medical records in [facility]. Per Dr. Daleen Bo dated 06/19/2018-' at that visit patient was continued on Effexor.  Patient was advised to continue psychotherapy with Ms. Felecia Jan.   Patient was also advised to taper off Xanax prescribed per PMD.'  Patient reports he takes Xanax only as needed 1 mg once a week or so.  Discussed tapering it off gradually.  We will order labs-TSH.  [facility] to [facility]  Follow-up in clinic in 1 to 2 weeks or sooner if needed.  I have spent atleast 25 minutes face to face with patient today. More than 50 % of the time was spent for psychoeducation and supportive psychotherapy and care coordination.  This note was generated in part or whole with voice recognition software. Voice recognition is usually quite accurate but there are transcription errors that can and very often do occur. I apologize for any typographical errors that were not detected and corrected.        Jomarie Longs, MD 10/23/2018, 5:43 PM

## 2018-10-23 NOTE — Patient Instructions (Signed)
Risperidone tablets What is this medicine? RISPERIDONE (ris PER i done) is an antipsychotic. It is used to treat schizophrenia, bipolar disorder, and some symptoms of autism. This medicine may be used for other purposes; ask your health care provider or pharmacist if you have questions. COMMON BRAND NAME(S): Risperdal What should I tell my health care provider before I take this medicine? They need to know if you have any of these conditions: -dehydration -dementia -diabetes -difficulty swallowing -heart disease -history of breast cancer -history of stroke -irregular heartbeat -kidney disease -liver disease -low blood counts, like low white cell, platelet, or red cell counts -low blood pressure -Parkinson's disease -seizures -an unusual or allergic reaction to risperidone, paliperidone, other medicines, foods, dyes, or preservatives -pregnant or trying to get pregnant -breast-feeding How should I use this medicine? Take this medicine by mouth with a glass of water. Follow the directions on the prescription label. You can take it with or without food. If it upsets your stomach, take it with food. Take your medicine at regular intervals. Do not take it more often than directed. Do not stop taking except on your doctor's advice. Talk to your pediatrician regarding the use of this medicine in children. While this drug may be prescribed for children as young as 5 years of age for selected conditions, precautions do apply. Overdosage: If you think you have taken too much of this medicine contact a poison control center or emergency room at once. NOTE: This medicine is only for you. Do not share this medicine with others. What if I miss a dose? If you miss a dose, take it as soon as you can. If it is almost time for your next dose, take only that dose. Do not take double or extra doses. What may interact with this medicine? Do not take this medicine with any of the following  medications: -cisapride -dextromethorphan; quinidine -dofetilide -dronedarone -metoclopramide -pimozide -quinidine -thioridazine This medicine may also interact with the following medications: -alcohol -certain medicines for anxiety or sleep -certain medicines for blood pressure -certain medicines for fungal infections like fluconazole, itraconazole, ketoconazole, posaconazole. and voriconazole -certain medicines for seizures like carbamazepine, phenobarbital, and phenytoin -fluoxetine -levodopa -other medicines that prolong the QT interval (cause an abnormal heart rhythm) -paroxetine -rifampin This list may not describe all possible interactions. Give your health care provider a list of all the medicines, herbs, non-prescription drugs, or dietary supplements you use. Also tell them if you smoke, drink alcohol, or use illegal drugs. Some items may interact with your medicine. What should I watch for while using this medicine? Tell your doctor or healthcare professional if your symptoms do not start to get better or if they get worse. Visit your doctor or healthcare professional for regular checks on your progress. It may be several weeks before you see the full effects of this medicine. You may get dizzy or drowsy. Do not drive, use machinery, or do anything that needs mental alertness until you know how this medicine affects you. Do not stand or sit up quickly, especially if you are an older patient. This reduces the risk of dizzy or fainting spells. Alcohol can increase dizziness and drowsiness. Avoid alcoholic drinks. This medicine can reduce the response of your body to heat or cold. Dress warm in cold weather and stay hydrated in hot weather. If possible, avoid extreme temperatures like saunas, hot tubs, very hot or cold showers, or activities that can cause dehydration such as vigorous exercise. If you notice an increased   hunger or thirst, different from your normal hunger or thirst,  or if you find that you have to urinate more frequently, you should contact your health care provider as soon as possible. You may need to have your blood sugar monitored. This medicine may cause changes in your blood sugar levels. You should monitor you blood sugar frequently if you have diabetes. What side effects may I notice from receiving this medicine? Side effects that you should report to your doctor or health care professional as soon as possible: -allergic reactions like skin rash, itching or hives, swelling of the face, lips, or tongue -abnormal production of milk -breast enlargement in both males and females -breathing problems -changes in emotions or moods -difficulty moving, slow movements, tremor -fever or chills, sore throat -males: prolonged, painful erection -missed or irregular menstrual periods -muscle pain, spasms -problems with balance, talking, walking -restlessness, pacing, inability to keep still -seizures -signs and symptoms of a dangerous change in heartbeat or heart rhythm like chest pain; dizziness; fast or irregular heartbeat; palpitations; feeling faint or lightheaded, falls; breathing problems -signs and symptoms of high blood sugar such as dizziness; dry mouth; dry skin; fruity breath; nausea; stomach pain; increased hunger or thirst; increased urination -signs and symptoms of low blood pressure like dizziness; feeling faint or lightheaded, falls; unusually weak or tired -signs and symptoms of neuroleptic malignant syndrome such as confusion; fast or irregular heart beat; high fever; increased sweating; stiff muscles -signs and symptoms of tardive dyskinesia such as uncontrollable head, neck, arm, or leg movements -sudden numbness or weakness of the face, arm, or leg -tremor -trouble swallowing Side effects that usually do not require medical attention (report to your doctor or health care professional if they continue or are bothersome): -constipation -dry  mouth -drowsiness -tiredness -trouble sleeping -upset stomach -weight gain This list may not describe all possible side effects. Call your doctor for medical advice about side effects. You may report side effects to FDA at 1-800-FDA-1088. Where should I keep my medicine? Keep out of the reach of children. Store at room temperature between 15 and 25 degrees C (59 and 77 degrees F). Protect from light. Throw away any unused medicine after the expiration date. NOTE: This sheet is a summary. It may not cover all possible information. If you have questions about this medicine, talk to your doctor, pharmacist, or health care provider.  2019 Elsevier/Gold Standard (2017-06-21 23:39:39)  

## 2018-10-24 LAB — TSH: TSH: 3.98 u[IU]/mL (ref 0.450–4.500)

## 2018-11-13 ENCOUNTER — Encounter: Payer: Self-pay | Admitting: Psychiatry

## 2018-11-13 ENCOUNTER — Other Ambulatory Visit: Payer: Self-pay

## 2018-11-13 ENCOUNTER — Ambulatory Visit: Payer: BC Managed Care – PPO | Admitting: Psychiatry

## 2018-11-13 VITALS — BP 138/92 | HR 87 | Temp 98.3°F | Wt 189.6 lb

## 2018-11-13 DIAGNOSIS — F331 Major depressive disorder, recurrent, moderate: Secondary | ICD-10-CM

## 2018-11-13 DIAGNOSIS — F429 Obsessive-compulsive disorder, unspecified: Secondary | ICD-10-CM

## 2018-11-13 MED ORDER — RISPERIDONE 0.5 MG PO TABS: 0.5000 mg | ORAL_TABLET | Freq: Every day | ORAL | 1 refills | Status: DC

## 2018-11-13 MED ORDER — CLOMIPRAMINE HCL 25 MG PO CAPS: 25.0000 mg | ORAL_CAPSULE | Freq: Every day | ORAL | 0 refills | Status: DC

## 2018-11-13 MED ORDER — VENLAFAXINE HCL ER 75 MG PO CP24: 75.0000 mg | ORAL_CAPSULE | Freq: Every day | ORAL | 0 refills | Status: DC

## 2018-11-13 NOTE — Progress Notes (Signed)
BH MD OP Progress Note  11/13/2018 5:38 PM Jeremy Boyer  MRN:  [id number]  Chief Complaint: ' I am here for follow up." Chief Complaint    Follow-up; Medication Refill     HPI: Jeremy Boyer is a 48 yr old Caucasian male, married, employed, lives in [place], has a history of depression, OCD, hypertension, presented to clinic today for a follow-up visit.  Patient today reports he continues to struggle with depressive symptoms.  He struggles with sadness, anhedonia, feeling tired, feeling bad about himself, trouble concentrating, not wanting to be here and so on.  This has been going on since the past several months and not getting better.  His Effexor was increased last visit and he was also started on risperidone.  He has not noticed much difference in his depressive symptoms with the same.  Patient reports he continues to struggle with OCD symptoms.  His OCD symptoms are checking, counting, the need for symmetry and sewn.  He hence has a lot of racing thoughts which affects his concentration all the time.  He does not think the Effexor is helpful.  Patient reports sleep may be improved on with the addition of risperidone.  He also feels like he goes through cycles when he sleeps okay for a while and then does not.  He continues to take Ambien as needed.  Patient had GeneSight testing reported and some time was spent discussing her GeneSight test results today. Discussed making medication changes based on testing.  He agrees with plan.  Discussed clomipramine and he agrees with plan.  Discussed with patient that he can be weaned off of the Effexor gradually.  We will reduce the Effexor to 75 mg and he will return to clinic in 2 weeks.  He will call clinic back if he has worsening withdrawal symptoms.  Patient advised to reach out to Ms. Felecia Jan again to restart psychotherapy sessions.  He has not seen her since last summer. Visit Diagnosis:    ICD-10-CM   1. MDD (major depressive  disorder), recurrent episode, moderate (HCC) F33.1 venlafaxine XR (EFFEXOR XR) 75 MG 24 hr capsule    risperiDONE (RISPERDAL) 0.5 MG tablet    clomiPRAMINE (ANAFRANIL) 25 MG capsule  2. Obsessive-compulsive disorder with good or fair insight F42.9 venlafaxine XR (EFFEXOR XR) 75 MG 24 hr capsule    risperiDONE (RISPERDAL) 0.5 MG tablet    clomiPRAMINE (ANAFRANIL) 25 MG capsule    Past Psychiatric History: Reviewed past psychiatric history from my progress note on 10/23/2018.  Past trials of Abilify-made him anxious, lithium, Trintellix, Depakote, mirtazapine, duloxetine.  Past Medical History:  Past Medical History:  Diagnosis Date  . Anxiety and depression 12/01/2015  . Bipolar disorder (HCC)   . Cholecystitis   . Hypertension 07/05/2017  . Insomnia 07/05/2017  . MDD (major depressive disorder), recurrent episode, moderate (HCC) 01/13/2016  . OCD (obsessive compulsive disorder) 01/13/2016    Past Surgical History:  Procedure Laterality Date  . CHOLECYSTECTOMY N/A 07/10/2017   Procedure: LAPAROSCOPIC CHOLECYSTECTOMY;  Surgeon: Ancil Linsey, MD;  Location: [facility] ORS;  Service: General;  Laterality: N/A;  . VASECTOMY    . VASECTOMY REVERSAL      Family Psychiatric History: I have reviewed family psychiatric history from my progress note on 10/23/2018  Family History:  Family History  Problem Relation Age of Onset  . Anxiety disorder Mother   . Depression Mother   . Alcohol abuse Father   . Anxiety disorder Sister   . Depression  Sister     Social History: Reviewed social history from my progress note on 10/23/2018. Social History   Socioeconomic History  . Marital status: Married    Spouse name: nancy  . Number of children: 3  . Years of education: Not on file  . Highest education level: Bachelor's degree (e.g., BA, AB, BS)  Occupational History  . Not on file  Social Needs  . Financial resource strain: Not hard at all  . Food insecurity:    Worry: Never true     Inability: Never true  . Transportation needs:    Medical: No    Non-medical: No  Tobacco Use  . Smoking status: Never Smoker  . Smokeless tobacco: Never Used  Substance and Sexual Activity  . Alcohol use: Yes    Alcohol/week: 23.0 standard drinks    Types: 2 Glasses of wine, 14 Cans of beer, 7 Shots of liquor per week  . Drug use: No  . Sexual activity: Yes    Birth control/protection: None  Lifestyle  . Physical activity:    Days per week: 0 days    Minutes per session: 0 min  . Stress: Very much  Relationships  . Social connections:    Talks on phone: Once a week    Gets together: Never    Attends religious service: Never    Active member of club or organization: No    Attends meetings of clubs or organizations: Never    Relationship status: Married  Other Topics Concern  . Not on file  Social History Narrative  . Not on file    Allergies: No Known Allergies  Metabolic Disorder Labs: No results found for: HGBA1C, MPG No results found for: PROLACTIN No results found for: CHOL, TRIG, HDL, CHOLHDL, VLDL, LDLCALC Lab Results  Component Value Date   TSH 3.980 10/23/2018    Therapeutic Level Labs: No results found for: LITHIUM No results found for: VALPROATE No components found for:  CBMZ  Current Medications: Current Outpatient Medications  Medication Sig Dispense Refill  . cyclobenzaprine (FLEXERIL) 10 MG tablet Take 10 mg by mouth 3 (three) times daily as needed for muscle spasms.     . valACYclovir (VALTREX) 500 MG tablet TAKE 1 TABLET BY MOUTH ONCE DAILY    . valsartan (DIOVAN) 320 MG tablet Take 1 tablet by mouth daily.  11  . zolpidem (AMBIEN CR) 12.5 MG CR tablet Take 1 tablet (12.5 mg total) by mouth at bedtime as needed for sleep. 30 tablet 2  . clomiPRAMINE (ANAFRANIL) 25 MG capsule Take 1 capsule (25 mg total) by mouth at bedtime. 30 capsule 0  . risperiDONE (RISPERDAL) 0.5 MG tablet Take 1 tablet (0.5 mg total) by mouth at bedtime. 30 tablet 1  .  venlafaxine XR (EFFEXOR XR) 75 MG 24 hr capsule Take 1 capsule (75 mg total) by mouth daily with breakfast. Being weaned off 30 capsule 0   No current facility-administered medications for this visit.      Musculoskeletal: Strength & Muscle Tone: within normal limits Gait & Station: normal Patient leans: N/A  Psychiatric Specialty Exam: Review of Systems  Psychiatric/Behavioral: Positive for depression. The patient is nervous/anxious.   All other systems reviewed and are negative.   Blood pressure (!) 138/92, pulse 87, temperature 98.3 F (36.8 C), temperature source Oral, weight 189 lb 9.6 oz (86 kg).Body mass index is 27.2 kg/m.  General Appearance: Casual  Eye Contact:  Fair  Speech:  Clear and Coherent  Volume:  Normal  Mood:  Anxious and Dysphoric  Affect:  Congruent  Thought Process:  Goal Directed and Descriptions of Associations: Intact  Orientation:  Full (Time, Place, and Person)  Thought Content: Logical   Suicidal Thoughts:  No  Homicidal Thoughts:  No  Memory:  Immediate;   Fair Recent;   Fair Remote;   Fair  Judgement:  Fair  Insight:  Fair  Psychomotor Activity:  Normal  Concentration:  Concentration: Fair and Attention Span: Fair  Recall:  [employer] of Knowledge: Fair  Language: Fair  Akathisia:  No  Handed:  Right  AIMS (if indicated): denies tremors, rigidity,stiffness  Assets:  Communication Skills Desire for Improvement Social Support  ADL's:  Intact  Cognition: WNL  Sleep:  Fair   Screenings:   Assessment and Plan: Mamie is a 48 year old Caucasian male who is married, employed, lives in [place], has a history of OCD, depression, presented to the clinic today for a follow-up visit.  Patient continues to struggle with depression and OCD symptoms.  GeneSight testing results were discussed and will continue to make medication changes.  Patient also advised to restart psychotherapy sessions.  Plan For OCD- unstable Discontinue Effexor-we  will taper it down to 75 mg p.o. daily for the next 2 weeks. Continue risperidone 0.5 mg p.o. nightly Add clomipramine 25 mg p.o. nightly  For MDD-unstable PHQ 9 today 19 Taper off Effexor as discussed above. Continue risperidone 0.5 mg p.o. nightly Start clomipramine 25 mg p.o. nightly  For insomnia-improving Ambien as prescribed  I have reviewed and discussed GeneSight testing results with patient.  I have reviewed labs-TSH- 10/23/2018-within normal limits.  Patient advised to restart psychotherapy sessions with Ms. Felecia Jan.  Follow-up in clinic in 2 weeks or sooner if needed.  I have spent atleast 25 minutes face to face with patient today. More than 50 % of the time was spent for psychoeducation and supportive psychotherapy and care coordination.  This note was generated in part or whole with voice recognition software. Voice recognition is usually quite accurate but there are transcription errors that can and very often do occur. I apologize for any typographical errors that were not detected and corrected.         Jomarie Longs, MD 11/13/2018, 5:38 PM

## 2018-11-13 NOTE — Patient Instructions (Signed)
Clomipramine capsules What is this medicine? CLOMIPRAMINE (kloe MI pra meen) is an antidepressant. It is used to treat obsessive-compulsive disorder. This medicine may be used for other purposes; ask your health care provider or pharmacist if you have questions. COMMON BRAND NAME(S): Anafranil What should I tell my health care provider before I take this medicine? They need to know if you have any of these conditions: -an alcohol problem -asthma, difficulty breathing -bipolar disease or schizophrenia -difficulty passing urine, prostate trouble -glaucoma -heart disease or previous heart attack -liver disease -over active thyroid -seizures -thoughts or plans of suicide, previous suicide attempt, or family history of suicide attempt -an unusual or allergic reaction to clomipramine, other medicines, foods, dyes, or preservatives -pregnant or trying to get pregnant -breast-feeding How should I use this medicine? Take this medicine by mouth with a glass of water. Follow the directions on the prescription label. Take this medicine with food to avoid stomach upset. Take your doses at regular intervals. Do not take your medicine more often than directed. Do not stop taking this medicine suddenly except upon the advice of your doctor. Stopping this medicine too quickly may cause serious side effects or your condition may worsen. A special MedGuide will be given to you by the pharmacist with each prescription and refill. Be sure to read this information carefully each time. Talk to your pediatrician regarding the use of this medicine in children. While this drug may be prescribed for children as young as 10 years for selected conditions, precautions do apply. Overdosage: If you think you have taken too much of this medicine contact a poison control center or emergency room at once. NOTE: This medicine is only for you. Do not share this medicine with others. What if I miss a dose? If you miss a dose,  take it as soon as you can. If it is almost time for your next dose, take only that dose. Do not take double or extra doses. What may interact with this medicine? Do not take this medicine with any of the following medications: -cisapride -dofetilide -dronedarone -linezolid -MAOIs like Carbex, Eldepryl, Marplan, Nardil, and Parnate -methylene blue (injected into a vein) -pimozide -thioridazine This medicine may also interact with the following medications: -atropine -antihistamines for allergy, cough, and cold -certain medicines for bladder problems like oxybutynin, tolterodine -certain medicines for blood pressure, heart disease, irregular heart beat -certain medicines for migraine headache like almotriptan, eletriptan, frovatriptan, naratriptan, rizatriptan, sumatriptan, zolmitriptan -certain medicines for stomach problems like dicyclomine, hyoscyamine -certain medicines for travel sickness like scopolamine -certain medicines for Parkinson's disease like benztropine, trihexyphenidyl -certain medicines for seizures like phenobarbital or phenytoin -certain medicines for sleep or anxiety -cimetidine -digoxin -haloperidol -general anesthetics like halothane, isoflurane, methoxyflurane, propofol -ipratropium -lithium -medicines that relax muscles for surgery -narcotic medicines for pain -other medicines for depression or psychotic disorders -other medicines that prolong the QT interval (an abnormal heart rhythm) -St. John's Wort -stimulant medicines for attention disorders, weight loss, or staying awake -thyroid hormones, like levothyroxine -tryptophan -warfarin This list may not describe all possible interactions. Give your health care provider a list of all the medicines, herbs, non-prescription drugs, or dietary supplements you use. Also tell them if you smoke, drink alcohol, or use illegal drugs. Some items may interact with your medicine. What should I watch for while using  this medicine? Tell your doctor if your symptoms do not get better or if they get worse. Visit your doctor or health care professional for regular checks on your   progress. Because it may take several weeks to see the full effects of this medicine, it is important to continue your treatment as prescribed by your doctor. Patients and their families should watch out for new or worsening thoughts of suicide or depression. Also watch out for sudden changes in feelings such as feeling anxious, agitated, panicky, irritable, hostile, aggressive, impulsive, severely restless, overly excited and hyperactive, or not being able to sleep. If this happens, especially at the beginning of treatment or after a change in dose, call your health care professional. Jeremy Boyer may get drowsy or dizzy. Do not drive, use machinery, or do anything that needs mental alertness until you know how this medicine affects you. Do not stand or sit up quickly, especially if you are an older patient. This reduces the risk of dizzy or fainting spells. Alcohol may interfere with the effect of this medicine. Avoid alcoholic drinks. Do not treat yourself for coughs, colds, or allergies without asking your doctor or health care professional for advice. Some ingredients can increase possible side effects. Your mouth may get dry. Chewing sugarless gum or sucking hard candy, and drinking plenty of water may help. Contact your doctor if the problem does not go away or is severe. This medicine may cause dry eyes and blurred vision. If you wear contact lenses you may feel some discomfort. Lubricating drops may help. See your eye doctor if the problem does not go away or is severe. This medicine can cause constipation. Try to have a bowel movement at least every 2 to 3 days. If you do not have a bowel movement for 3 days, call your doctor or health care professional. This medicine can make you more sensitive to the sun. Keep out of the sun. If you cannot avoid  being in the sun, wear protective clothing and use sunscreen. Do not use sun lamps or tanning beds/booths. What side effects may I notice from receiving this medicine? Side effects that you should report to your doctor or health care professional as soon as possible: -allergic reactions like skin rash, itching or hives, swelling of the face, lips, or tongue -anxious -breathing problems -changes in vision -confusion -elevated mood, decreased need for sleep, racing thoughts, impulsive behavior -eye pain -fast, irregular heartbeat -feeling agitated, angry, or irritable -fever or chills, sore throat -fever with rash, swollen lymph nodes, or swelling of the face -hallucination, loss of contact with reality -muscle stiffness, spasms -restlessness, pacing, inability to keep still -seizures -signs and symptoms of low sodium like trouble concentrating, confusion, weakness, memory problems, headache, and problems with balance -suicidal thoughts or other mood changes -tingling, pain, or numbness in the feet or hands -trouble passing urine or change in the amount of urine -unusually weak or tired -vomiting -yellowing of the eyes or skin Side effects that usually do not require medical attention (report to your doctor or health care professional if they continue or are bothersome): -change in sex drive or performance -change in appetite or weight -constipation -dry mouth -indigestion, stomach upset -nausea -tremors This list may not describe all possible side effects. Call your doctor for medical advice about side effects. You may report side effects to FDA at 1-800-FDA-1088. Where should I keep my medicine? Keep out of the reach of children. Store at room temperature between 20 and 25 degrees C (68 and 77 degrees F). Protect from moisture. Keep container tightly closed. Throw away any unused medicine after the expiration date. NOTE: This sheet is a summary. It may not  cover all possible  information. If you have questions about this medicine, talk to your doctor, pharmacist, or health care provider.  2019 Elsevier/Gold Standard (2018-03-05 15:31:49)

## 2018-11-27 ENCOUNTER — Ambulatory Visit: Payer: BC Managed Care – PPO | Admitting: Psychiatry

## 2018-12-03 ENCOUNTER — Ambulatory Visit (INDEPENDENT_AMBULATORY_CARE_PROVIDER_SITE_OTHER): Payer: BC Managed Care – PPO | Admitting: Psychiatry

## 2018-12-03 ENCOUNTER — Encounter: Payer: Self-pay | Admitting: Psychiatry

## 2018-12-03 VITALS — BP 153/92 | HR 78 | Ht 70.0 in | Wt 196.0 lb

## 2018-12-03 DIAGNOSIS — F429 Obsessive-compulsive disorder, unspecified: Secondary | ICD-10-CM | POA: Diagnosis not present

## 2018-12-03 DIAGNOSIS — F5105 Insomnia due to other mental disorder: Secondary | ICD-10-CM | POA: Diagnosis not present

## 2018-12-03 DIAGNOSIS — F331 Major depressive disorder, recurrent, moderate: Secondary | ICD-10-CM | POA: Diagnosis not present

## 2018-12-03 MED ORDER — VENLAFAXINE HCL ER 37.5 MG PO CP24: 37.5000 mg | ORAL_CAPSULE | ORAL | 1 refills | Status: DC

## 2018-12-03 MED ORDER — ZOLPIDEM TARTRATE ER 12.5 MG PO TBCR: 12.5000 mg | EXTENDED_RELEASE_TABLET | Freq: Every evening | ORAL | 2 refills | Status: DC | PRN

## 2018-12-03 MED ORDER — CLOMIPRAMINE HCL 50 MG PO CAPS: 50.0000 mg | ORAL_CAPSULE | Freq: Every day | ORAL | 1 refills | Status: DC

## 2018-12-03 NOTE — Progress Notes (Signed)
[facility] MD OP Progress Note  12/03/2018 5:38 PM Jeremy Boyer  MRN:  [id number]  Chief Complaint: ' I am here for follow up." Chief Complaint    Follow-up     HPI: Jeremy Boyer is a 48 yr old Caucasian male, married, employed, lives in [place], has a history of depression, OCD, hypertension, presented to clinic today for a follow-up visit.  Patient today reports he is compliant on his clomipramine as prescribed.  He reports he has noticed some redness and watery eyes only on one side.  He does not know if he is coming down with conjunctivitis or something like that.  He reports he has called his primary medical doctor and is waiting to hear back.  He otherwise denies any other problems at this time.  He however continues to struggle with anxiety symptoms.  He has a lot of racing thoughts.  He reports he lost one of his colleagues recently and since then his racing thoughts have worsened.  He also continues to struggle with sleep.  He reports he would like to get back on his Ambien which he used to take previously.  He has not been taking anything since the past few weeks.  Patient reports he continues to be interested in getting an ADHD testing done.  He reports his child was diagnosed with ADHD and he wonders whether he also has it.  He reports he is unable to concentrate on his work.  Discussed with him that he can be referred to [facility].  Patient is tolerating the weaning off of the Effexor well.  He reports he takes 75 mg every other day.  Discussed with him to reduce to 37.5 mg every other day and stop taking after 10 days.  He agrees with plan.  He reports he has supplies at home.  Patient denies any other concerns today.  He has not been able to contact Ms. Felecia Jan as discussed to restart psychotherapy sessions. Visit Diagnosis:    ICD-10-CM   1. MDD (major depressive disorder), recurrent episode, moderate (HCC) F33.1 clomiPRAMINE (ANAFRANIL) 50 MG capsule     zolpidem (AMBIEN CR) 12.5 MG CR tablet  2. Obsessive-compulsive disorder with good or fair insight F42.9 clomiPRAMINE (ANAFRANIL) 50 MG capsule  3. Insomnia due to mental condition F51.05     Past Psychiatric History: I have reviewed past psychiatric history from my progress note on 10/23/2018.  Past trials of Abilify-made him anxious, lithium, Trintellix, Depakote, mirtazapine, duloxetine  Past Medical History:  Past Medical History:  Diagnosis Date  . Anxiety and depression 12/01/2015  . Bipolar disorder (HCC)   . Cholecystitis   . Hypertension 07/05/2017  . Insomnia 07/05/2017  . MDD (major depressive disorder), recurrent episode, moderate (HCC) 01/13/2016  . OCD (obsessive compulsive disorder) 01/13/2016    Past Surgical History:  Procedure Laterality Date  . CHOLECYSTECTOMY N/A 07/10/2017   Procedure: LAPAROSCOPIC CHOLECYSTECTOMY;  Surgeon: Ancil Linsey, MD;  Location: [facility] ORS;  Service: General;  Laterality: N/A;  . VASECTOMY    . VASECTOMY REVERSAL      Family Psychiatric History: Reviewed family psychiatric history from my progress note on 10/23/2018  Family History:  Family History  Problem Relation Age of Onset  . Anxiety disorder Mother   . Depression Mother   . Alcohol abuse Father   . Anxiety disorder Sister   . Depression Sister     Social History: Reviewed social history from my progress note on 10/23/2018 Social History  Socioeconomic History  . Marital status: Married    Spouse name: nancy  . Number of children: 3  . Years of education: Not on file  . Highest education level: Bachelor's degree (e.g., BA, AB, BS)  Occupational History  . Not on file  Social Needs  . Financial resource strain: Not hard at all  . Food insecurity:    Worry: Never true    Inability: Never true  . Transportation needs:    Medical: No    Non-medical: No  Tobacco Use  . Smoking status: Never Smoker  . Smokeless tobacco: Never Used  Substance and Sexual Activity  .  Alcohol use: Yes    Alcohol/week: 23.0 standard drinks    Types: 2 Glasses of wine, 14 Cans of beer, 7 Shots of liquor per week  . Drug use: No  . Sexual activity: Yes    Partners: Female    Birth control/protection: None  Lifestyle  . Physical activity:    Days per week: 0 days    Minutes per session: 0 min  . Stress: Very much  Relationships  . Social connections:    Talks on phone: Once a week    Gets together: Never    Attends religious service: Never    Active member of club or organization: No    Attends meetings of clubs or organizations: Never    Relationship status: Married  Other Topics Concern  . Not on file  Social History Narrative  . Not on file    Allergies: No Known Allergies  Metabolic Disorder Labs: No results found for: HGBA1C, MPG No results found for: PROLACTIN No results found for: CHOL, TRIG, HDL, CHOLHDL, VLDL, LDLCALC Lab Results  Component Value Date   TSH 3.980 10/23/2018    Therapeutic Level Labs: No results found for: LITHIUM No results found for: VALPROATE No components found for:  CBMZ  Current Medications: Current Outpatient Medications  Medication Sig Dispense Refill  . cyclobenzaprine (FLEXERIL) 10 MG tablet Take 10 mg by mouth 3 (three) times daily as needed for muscle spasms.     . risperiDONE (RISPERDAL) 0.5 MG tablet Take 1 tablet (0.5 mg total) by mouth at bedtime. 30 tablet 1  . valACYclovir (VALTREX) 500 MG tablet TAKE 1 TABLET BY MOUTH ONCE DAILY    . valsartan (DIOVAN) 320 MG tablet Take 1 tablet by mouth daily.  11  . zolpidem (AMBIEN CR) 12.5 MG CR tablet Take 1 tablet (12.5 mg total) by mouth at bedtime as needed for sleep. 30 tablet 2  . clomiPRAMINE (ANAFRANIL) 50 MG capsule Take 1 capsule (50 mg total) by mouth at bedtime. 30 capsule 1  . venlafaxine XR (EFFEXOR-XR) 37.5 MG 24 hr capsule Take 1 capsule (37.5 mg total) by mouth as directed. Take it every other day and stop taking it after 10 days 10 capsule 1   No  current facility-administered medications for this visit.      Musculoskeletal: Strength & Muscle Tone: within normal limits Gait & Station: normal Patient leans: N/A  Psychiatric Specialty Exam: Review of Systems  Eyes: Positive for redness.  Psychiatric/Behavioral: The patient is nervous/anxious and has insomnia.   All other systems reviewed and are negative.   Blood pressure (!) 153/92, pulse 78, height 5\' 10"  (1.778 m), weight 196 lb (88.9 kg).Body mass index is 28.12 kg/m.  General Appearance: Casual  Eye Contact:  Fair  Speech:  Normal Rate  Volume:  Normal  Mood:  Anxious  Affect:  Congruent  Thought Process:  Goal Directed and Descriptions of Associations: Intact  Orientation:  Full (Time, Place, and Person)  Thought Content: Logical   Suicidal Thoughts:  No  Homicidal Thoughts:  No  Memory:  Immediate;   Fair Recent;   Fair Remote;   Fair  Judgement:  Fair  Insight:  Good  Psychomotor Activity:  Normal  Concentration:  Concentration: Fair and Attention Span: Fair  Recall:  [employer] of Knowledge: Fair  Language: Good  Akathisia:  No  Handed:  Right  AIMS (if indicated): denies tremors, rigidity,stiffness  Assets:  Communication Skills Desire for Improvement Social Support  ADL's:  Intact  Cognition: WNL  Sleep:  Poor   Screenings:   Assessment and Plan: Razi is a 48 yr old Caucasian male who is married, employed, lives in [place], has a history of OCD, depression, presented to clinic today for a follow-up visit.  Patient continues to struggle with racing thoughts, sleep problems.  He is interested in an ADHD testing.  We will continue to make medication changes and will refer him for the same.  Plan For OCD-unstable Taper of Effexor.  He will reduce it to 37.5 mg every other day for the next 10 days. Continue risperidone 0.5 mg p.o. nightly Increase clomipramine to 50 mg p.o. nightly   For MDD-unstable Risperidone 0.5 mg p.o.  nightly Clomipramine 50 mg p.o. nightly  For insomnia- unstable Restart Ambien CR 12.5 mg p.o. nightly  Discussed with patient to restart psychotherapy sessions with Ms. Felecia Jan- he agrees with same.  We will refer him for ADHD testing to [facility].  Patient with elevated blood pressure advised to follow-up with his PMD.  Follow-up in clinic in 4 weeks or sooner.  I have spent atleast 15 minutes face to face with patient today. More than 50 % of the time was spent for psychoeducation and supportive psychotherapy and care coordination.  This note was generated in part or whole with voice recognition software. Voice recognition is usually quite accurate but there are transcription errors that can and very often do occur. I apologize for any typographical errors that were not detected and corrected.       Jomarie Longs, MD 12/03/2018, 5:38 PM

## 2019-01-16 ENCOUNTER — Other Ambulatory Visit: Payer: Self-pay | Admitting: Psychiatry

## 2019-01-16 DIAGNOSIS — F429 Obsessive-compulsive disorder, unspecified: Secondary | ICD-10-CM

## 2019-01-16 DIAGNOSIS — F331 Major depressive disorder, recurrent, moderate: Secondary | ICD-10-CM

## 2019-01-31 ENCOUNTER — Other Ambulatory Visit: Payer: Self-pay

## 2019-01-31 ENCOUNTER — Ambulatory Visit (INDEPENDENT_AMBULATORY_CARE_PROVIDER_SITE_OTHER): Payer: BC Managed Care – PPO | Admitting: Psychiatry

## 2019-01-31 ENCOUNTER — Encounter: Payer: Self-pay | Admitting: Psychiatry

## 2019-01-31 DIAGNOSIS — F900 Attention-deficit hyperactivity disorder, predominantly inattentive type: Secondary | ICD-10-CM | POA: Diagnosis not present

## 2019-01-31 DIAGNOSIS — F331 Major depressive disorder, recurrent, moderate: Secondary | ICD-10-CM | POA: Diagnosis not present

## 2019-01-31 DIAGNOSIS — F5105 Insomnia due to other mental disorder: Secondary | ICD-10-CM

## 2019-01-31 DIAGNOSIS — F429 Obsessive-compulsive disorder, unspecified: Secondary | ICD-10-CM | POA: Diagnosis not present

## 2019-01-31 MED ORDER — METHYLPHENIDATE HCL ER 18 MG PO TB24: 18.0000 mg | ORAL_TABLET | Freq: Every day | ORAL | 0 refills | Status: DC

## 2019-01-31 NOTE — Progress Notes (Signed)
Virtual Visit via Video Note  I connected with Jeremy Boyer on 01/31/19 at  2:00 PM EDT by a video enabled telemedicine application and verified that I am speaking with the correct person using two identifiers.   I discussed the limitations of evaluation and management by telemedicine and the availability of in person appointments. The patient expressed understanding and agreed to proceed.    I discussed the assessment and treatment plan with the patient. The patient was provided an opportunity to ask questions and all were answered. The patient agreed with the plan and demonstrated an understanding of the instructions.   The patient was advised to call back or seek an in-person evaluation if the symptoms worsen or if the condition fails to improve as anticipated.   BH MD OP Progress Note  01/31/2019 5:58 PM [name]  MRN:  [id number]  Chief Complaint:  Chief Complaint    Follow-up     HPI: Jeremy Boyer is a 48 year old Caucasian male, married, employed, lives in [place], has a history of depression, OCD, hypertension, was evaluated by telemedicine today.  Patient today reports he is currently staying home due to the COVID-19 outbreak.  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  He reports that is going well.  He continues to be busy at home since he also has to take care of his twin babies.  Patient reports he is tolerating the clomipramine well.  He denies any side effects.  He reports his OCD symptoms are more under control and he has been able to cope with his obsessions better than before.  He reports he has noticed his sleep as restless on and off only because his babies may need his help in the middle of the night.  Patient had ADHD testing done with [facility].  His ADHD testing report was discussed with him.  Per testing done on 12/31/2018-Dr. Lovenia Kim, '  Patient possibly meets criteria for ADHD.  He has some  characteristic of ADHD that fit with the diagnosis better than OCD.  His OCD is quite significant though and I do believe it is the likely cause for most of his attention related symptoms.  He definitely has social anxiety and his sleep history concerning for bipolar disorder.  We discussed that it be reasonable to have a trial of stimulant medication for treatment of ADHD.  But he will need to be carefully monitored given the tendency for this medication to worsen OCD behaviors.  Given his complicated history I told him that if he is to try stimulant medication it would be best done by his psychiatrist since she is more familiar with his baseline behaviors and can monitor and adjust treatment for his comorbid condition more readily.  I recommend utilization of Foley long-acting stimulants and that the dose start low and be increased more slowly than is typical for ADHD medication titration.'  This was discussed with patient.  Discussed adding Concerta.  He agrees with plan.  Patient currently denies any suicidality, homicidality or perceptual disturbances.   Visit Diagnosis:    ICD-10-CM   1. MDD (major depressive disorder), recurrent episode, moderate (HCC) F33.1   2. Obsessive-compulsive disorder with good or fair insight F42.9   3. Attention deficit hyperactivity disorder (ADHD), predominantly inattentive type F90.0 methylphenidate 18 MG PO CR tablet  4. Insomnia due to mental condition F51.05     Past Psychiatric History: Have reviewed past psychiatric history from my progress note on  10/23/2018.  Past trials of Abilify-made him anxious, lithium, Trintellix, Depakote, mirtazapine, duloxetine  Past Medical History:  Past Medical History:  Diagnosis Date  . Anxiety and depression 12/01/2015  . Bipolar disorder (HCC)   . Cholecystitis   . Hypertension 07/05/2017  . Insomnia 07/05/2017  . MDD (major depressive disorder), recurrent episode, moderate (HCC) 01/13/2016  . OCD (obsessive compulsive  disorder) 01/13/2016    Past Surgical History:  Procedure Laterality Date  . CHOLECYSTECTOMY N/A 07/10/2017   Procedure: LAPAROSCOPIC CHOLECYSTECTOMY;  Surgeon: Ancil Linseyavis, Jason Evan, MD;  Location: [facility] ORS;  Service: General;  Laterality: N/A;  . VASECTOMY    . VASECTOMY REVERSAL      Family Psychiatric History: Reviewed family psychiatric history from my progress note on 10/23/2018.  Family History:  Family History  Problem Relation Age of Onset  . Anxiety disorder Mother   . Depression Mother   . Alcohol abuse Father   . Anxiety disorder Sister   . Depression Sister     Social History: Reviewed social history from my progress note on 10/23/2018 Social History   Socioeconomic History  . Marital status: Married    Spouse name: Jeremy Boyer  . Number of children: 3  . Years of education: Not on file  . Highest education level: Bachelor's degree (e.g., BA, AB, BS)  Occupational History  . Not on file  Social Needs  . Financial resource strain: Not hard at all  . Food insecurity:    Worry: Never true    Inability: Never true  . Transportation needs:    Medical: No    Non-medical: No  Tobacco Use  . Smoking status: Never Smoker  . Smokeless tobacco: Never Used  Substance and Sexual Activity  . Alcohol use: Yes    Alcohol/week: 23.0 standard drinks    Types: 2 Glasses of wine, 14 Cans of beer, 7 Shots of liquor per week  . Drug use: No  . Sexual activity: Yes    Partners: Female    Birth control/protection: None  Lifestyle  . Physical activity:    Days per week: 0 days    Minutes per session: 0 min  . Stress: Very much  Relationships  . Social connections:    Talks on phone: Once a week    Gets together: Never    Attends religious service: Never    Active member of club or organization: No    Attends meetings of clubs or organizations: Never    Relationship status: Married  Other Topics Concern  . Not on file  Social History Narrative  . Not on file    Allergies:  No Known Allergies  Metabolic Disorder Labs: No results found for: HGBA1C, MPG No results found for: PROLACTIN No results found for: CHOL, TRIG, HDL, CHOLHDL, VLDL, LDLCALC Lab Results  Component Value Date   TSH 3.980 10/23/2018    Therapeutic Level Labs: No results found for: LITHIUM No results found for: VALPROATE No components found for:  CBMZ  Current Medications: Current Outpatient Medications  Medication Sig Dispense Refill  . clomiPRAMINE (ANAFRANIL) 50 MG capsule Take 1 capsule (50 mg total) by mouth at bedtime. 30 capsule 1  . cyclobenzaprine (FLEXERIL) 10 MG tablet Take 10 mg by mouth 3 (three) times daily as needed for muscle spasms.     . methylphenidate 18 MG PO CR tablet Take 1 tablet (18 mg total) by mouth daily. 30 tablet 0  . risperiDONE (RISPERDAL) 0.5 MG tablet TAKE 1 TABLET(0.5 MG) BY MOUTH  AT BEDTIME 30 tablet 1  . valACYclovir (VALTREX) 500 MG tablet TAKE 1 TABLET BY MOUTH ONCE DAILY    . valsartan (DIOVAN) 320 MG tablet Take 1 tablet by mouth daily.  11  . venlafaxine XR (EFFEXOR-XR) 37.5 MG 24 hr capsule Take 1 capsule (37.5 mg total) by mouth as directed. Take it every other day and stop taking it after 10 days 10 capsule 1  . zolpidem (AMBIEN CR) 12.5 MG CR tablet Take 1 tablet (12.5 mg total) by mouth at bedtime as needed for sleep. 30 tablet 2   No current facility-administered medications for this visit.      Musculoskeletal: Strength & Muscle Tone: UTA Gait & Station: normal Patient leans: N/A  Psychiatric Specialty Exam: Review of Systems  Psychiatric/Behavioral: The patient is nervous/anxious.   All other systems reviewed and are negative.   There were no vitals taken for this visit.There is no height or weight on file to calculate BMI.  General Appearance: Casual  Eye Contact:  Fair  Speech:  Clear and Coherent  Volume:  Normal  Mood:  Anxious  Affect:  Appropriate  Thought Process:  Goal Directed and Descriptions of Associations:  Intact  Orientation:  Full (Time, Place, and Person)  Thought Content: Logical   Suicidal Thoughts:  No  Homicidal Thoughts:  No  Memory:  Immediate;   Fair Recent;   Fair Remote;   Fair  Judgement:  Good  Insight:  Good  Psychomotor Activity:  Normal  Concentration:  Concentration: Fair and Attention Span: Fair  Recall:  [employer] of Knowledge: Fair  Language: Fair  Akathisia:  No  Handed:  Right  AIMS (if indicated): Denies tremors, rigidity  Assets:  Communication Skills Desire for Improvement Housing Resilience Social Support  ADL's:  Intact  Cognition: WNL  Sleep:  Fair   Screenings:   Assessment and Plan: Jeremy Boyer is a 48 year old Caucasian male who is married, employed, lives in [place], has a history of OCD, depression, recent ADHD diagnosis was evaluated by telemedicine today.  Patient is currently making progress on the current medication regimen and will continue to need medication readjustment for his attention and focus problems.  Discussed plan as noted below.  Plan For OCD-improving Clomipramine 50 mg p.o. nightly Risperidone 0.5 mg p.o. nightly  For MDD-some progress Risperidone 0.5 mg p.o. nightly Clomipramine 50 mg p.o. nightly  For ADHD-unstable Discussed ADHD testing report per Dr. Lovenia Kim dated 12/31/2018 with  patient as summarized above. Start Concerta 18 mg p.o. daily.  For insomnia-improving Ambien CR 12.5 mg p.o. nightly  Patient was advised to restart psychotherapy sessions with Ms. Felecia Jan previously-pending  Follow-up in clinic in 3 to 4 weeks or sooner if needed.  I have spent atleast 25 minutes non face to face with patient today. More than 50 % of the time was spent for psychoeducation and supportive psychotherapy and care coordination.  This note was generated in part or whole with voice recognition software. Voice recognition is usually quite accurate but there are transcription errors that can and very often do occur.  I apologize for any typographical errors that were not detected and corrected.       Jomarie Longs, MD 01/31/2019, 5:58 PM

## 2019-01-31 NOTE — Patient Instructions (Signed)
Methylphenidate extended-release tablets  What is this medicine?  METHYLPHENIDATE (meth il FEN i date) is used to treat attention-deficit hyperactivity disorder (ADHD). It is also used to treat narcolepsy.  This medicine may be used for other purposes; ask your health care provider or pharmacist if you have questions.  COMMON BRAND NAME(S): Concerta, Metadate ER, Methylin, RELEXXII, Ritalin SR  What should I tell my health care provider before I take this medicine?  They need to know if you have any of these conditions:  -anxiety or panic attacks  -circulation problems in fingers and toes  -difficulty swallowing, problems with the esophagus, or a history of blockage of the stomach or intestines  -glaucoma  -hardening or blockages of the arteries or heart blood vessels  -heart disease or a heart defect  -high blood pressure  -history of a drug or alcohol abuse problem  -history of stroke  -liver disease  -mental illness  -motor tics, family history or diagnosis of Tourette's syndrome  -seizures  -suicidal thoughts, plans, or attempt; a previous suicide attempt by you or a family member  -thyroid disease  -an unusual or allergic reaction to methylphenidate, other medicines, foods, dyes, or preservatives  -pregnant or trying to get pregnant  -breast-feeding  How should I use this medicine?  Take this medicine by mouth with a glass of water. Follow the directions on the prescription label. Do not crush, cut, or chew the tablet. You may take this medicine with food. Take your medicine at regular intervals. Do not take it more often than directed. If you take your medicine more than once a day, try to take your last dose at least 8 hours before bedtime. This well help prevent the medicine from interfering with your sleep.  A special MedGuide will be given to you by the pharmacist with each prescription and refill. Be sure to read this information carefully each time.  Talk to your pediatrician regarding the use of this  medicine in children. While this drug may be prescribed for children as young as 6 years for selected conditions, precautions do apply.  Overdosage: If you think you have taken too much of this medicine contact a poison control center or emergency room at once.  NOTE: This medicine is only for you. Do not share this medicine with others.  What if I miss a dose?  If you miss a dose, take it as soon as you can. If it is almost time for your next dose, take only that dose. Do not take double or extra doses.  What may interact with this medicine?  Do not take this medicine with any of the following medications:  -lithium  -MAOIs like Carbex, Eldepryl, Marplan, Nardil, and Parnate  -other stimulant medicines for attention disorders, weight loss, or to stay awake  -procarbazine  This medicine may also interact with the following medications:  -atomoxetine  -caffeine  -certain medicines for blood pressure, heart disease, irregular heart beat  -certain medicines for depression, anxiety, or psychotic disturbances  -certain medicines for seizures like carbamazepine, phenobarbital, phenytoin  -cold or allergy medicines  -warfarin  This list may not describe all possible interactions. Give your health care provider a list of all the medicines, herbs, non-prescription drugs, or dietary supplements you use. Also tell them if you smoke, drink alcohol, or use illegal drugs. Some items may interact with your medicine.  What should I watch for while using this medicine?  Visit your doctor or health care professional for regular checks   on your progress. This prescription requires that you follow special procedures with your doctor and pharmacy. You will need to have a new written prescription from your doctor or health care professional every time you need a refill.  This medicine may affect your concentration, or hide signs of tiredness. Until you know how this drug affects you, do not drive, ride a bicycle, use machinery, or do  anything that needs mental alertness.  Tell your doctor or health care professional if this medicine loses its effects, or if you feel you need to take more than the prescribed amount. Do not change the dosage without talking to your doctor or health care professional.  For males, contact your doctor or health care professional right away if you have an erection that lasts longer than 4 hours or if it becomes painful. This may be a sign of a serious problem and must be treated right away to prevent permanent damage.  Decreased appetite is a common side effect when starting this medicine. Eating small, frequent meals or snacks can help. Talk to your doctor if you continue to have poor eating habits. Height and weight growth of a child taking this medicine will be monitored closely.  Do not take this medicine close to bedtime. It may prevent you from sleeping.  The tablet shell for some brands of this medicine does not dissolve. This is normal. The tablet shell may appear whole in the stool. This is not a cause for concern.  If you are going to need surgery, a MRI, CT scan, or other procedure, tell your doctor that you are taking this medicine. You may need to stop taking this medicine before the procedure.  Tell your doctor or healthcare professional right away if you notice unexplained wounds on your fingers and toes while taking this medicine. You should also tell your healthcare provider if you experience numbness or pain, changes in the skin color, or sensitivity to temperature in your fingers or toes.  What side effects may I notice from receiving this medicine?  Side effects that you should report to your doctor or health care professional as soon as possible:  -allergic reactions like skin rash, itching or hives, swelling of the face, lips, or tongue  -changes in vision  -chest pain or chest tightness  -fast, irregular heartbeat  -fingers or toes feel numb, cool, painful  -hallucination, loss of contact with  reality  -high blood pressure  -males: prolonged or painful erection  -seizures  -severe headaches  -severe stomach pain, vomiting  -shortness of breath  -suicidal thoughts or other mood changes  -trouble swallowing  -trouble walking, dizziness, loss of balance or coordination  -uncontrollable head, mouth, neck, arm, or leg movements  -unusual bleeding or bruising  Side effects that usually do not require medical attention (report to your doctor or health care professional if they continue or are bothersome):  -anxious  -headache  -loss of appetite  -nausea  -trouble sleeping  -weight loss  This list may not describe all possible side effects. Call your doctor for medical advice about side effects. You may report side effects to FDA at 1-800-FDA-1088.  Where should I keep my medicine?  Keep out of the reach of children. This medicine can be abused. Keep your medicine in a safe place to protect it from theft. Do not share this medicine with anyone. Selling or giving away this medicine is dangerous and against the law.  This medicine may cause accidental overdose and death   if taken by other adults, children, or pets. Mix any unused medicine with a substance like cat litter or coffee grounds. Then throw the medicine away in a sealed container like a sealed bag or a coffee can with a lid. Do not use the medicine after the expiration date.  Store at room temperature between 15 and 30 degrees C (59 and 86 degrees F). Protect from light and moisture. Keep container tightly closed.  NOTE: This sheet is a summary. It may not cover all possible information. If you have questions about this medicine, talk to your doctor, pharmacist, or health care provider.   2019 Elsevier/Gold Standard (2017-01-24 15:01:37)

## 2019-02-04 ENCOUNTER — Telehealth: Payer: Self-pay

## 2019-02-04 NOTE — Telephone Encounter (Signed)
received a pa request for methlphenidate 18mg 

## 2019-02-04 NOTE — Telephone Encounter (Signed)
received fax that rx was approved from  02-04-19 to 02-03-2022

## 2019-02-04 NOTE — Telephone Encounter (Signed)
went online to [email or web address] and submited the pa for this medications. pending review.

## 2019-02-05 NOTE — Telephone Encounter (Signed)
Ok thanks 

## 2019-02-08 ENCOUNTER — Other Ambulatory Visit: Payer: Self-pay | Admitting: Psychiatry

## 2019-02-08 DIAGNOSIS — F429 Obsessive-compulsive disorder, unspecified: Secondary | ICD-10-CM

## 2019-02-08 DIAGNOSIS — F331 Major depressive disorder, recurrent, moderate: Secondary | ICD-10-CM

## 2019-02-28 ENCOUNTER — Other Ambulatory Visit: Payer: Self-pay

## 2019-02-28 ENCOUNTER — Ambulatory Visit (INDEPENDENT_AMBULATORY_CARE_PROVIDER_SITE_OTHER): Payer: BC Managed Care – PPO | Admitting: Psychiatry

## 2019-02-28 ENCOUNTER — Encounter: Payer: Self-pay | Admitting: Psychiatry

## 2019-02-28 ENCOUNTER — Telehealth: Payer: Self-pay

## 2019-02-28 DIAGNOSIS — F900 Attention-deficit hyperactivity disorder, predominantly inattentive type: Secondary | ICD-10-CM

## 2019-02-28 DIAGNOSIS — F429 Obsessive-compulsive disorder, unspecified: Secondary | ICD-10-CM | POA: Diagnosis not present

## 2019-02-28 DIAGNOSIS — F5105 Insomnia due to other mental disorder: Secondary | ICD-10-CM

## 2019-02-28 DIAGNOSIS — F331 Major depressive disorder, recurrent, moderate: Secondary | ICD-10-CM

## 2019-02-28 MED ORDER — ZOLPIDEM TARTRATE ER 12.5 MG PO TBCR: 12.5000 mg | EXTENDED_RELEASE_TABLET | Freq: Every evening | ORAL | 2 refills | Status: DC | PRN

## 2019-02-28 MED ORDER — METHYLPHENIDATE HCL ER 18 MG PO TB24: 18.0000 mg | ORAL_TABLET | Freq: Every day | ORAL | 0 refills | Status: DC

## 2019-02-28 MED ORDER — RISPERIDONE 0.5 MG PO TABS: ORAL_TABLET | ORAL | 0 refills | Status: DC

## 2019-02-28 MED ORDER — CLOMIPRAMINE HCL 50 MG PO CAPS: ORAL_CAPSULE | ORAL | 0 refills | Status: DC

## 2019-02-28 NOTE — Telephone Encounter (Signed)
received email that a prior auth was needed for pt methylphenidate.

## 2019-02-28 NOTE — Progress Notes (Signed)
Virtual Visit via Video Note  I connected with Jeremy Boyer on 02/28/19 at  1:15 PM EDT by a video enabled telemedicine application and verified that I am speaking with the correct person using two identifiers.   I discussed the limitations of evaluation and management by telemedicine and the availability of in person appointments. The patient expressed understanding and agreed to proceed.    I discussed the assessment and treatment plan with the patient. The patient was provided an opportunity to ask questions and all were answered. The patient agreed with the plan and demonstrated an understanding of the instructions.   The patient was advised to call back or seek an in-person evaluation if the symptoms worsen or if the condition fails to improve as anticipated.   BH MD OP Progress Note  02/28/2019 2:30 PM Jeremy Boyer  MRN:  [id number]  Chief Complaint:  Chief Complaint    Follow-up     HPI: Jeremy Boyer is a 48 year old Caucasian male, married, employed, lives in [place], has a history of MDD, OCD, hypertension was evaluated by telemedicine today.  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  He looks forward to the summer break.  He continues to stay busy at home with his twin babies.  He reports he is tolerating the Concerta well.  Patient reports he has noticed changes in his anxiety as well as concentration since being on the Concerta.  He feels more relaxed and is able to multitask which is an improvement for him.  He has not noticed any side effects to the medication.  He however reports he is currently working from home and that is a lot different from working from his classroom at school.  He hence does not know if this will work when he goes back or not.   He reports he continues to be compliant with the clomipramine as scheduled.  He denies any significant OCD symptoms.  He  denies any suicidality, homicidality or perceptual disturbances.  He reports sleep is good.  He denies any other concerns today. Visit Diagnosis:    ICD-10-CM   1. MDD (major depressive disorder), recurrent episode, moderate (HCC) F33.1 risperiDONE (RISPERDAL) 0.5 MG tablet    zolpidem (AMBIEN CR) 12.5 MG CR tablet    clomiPRAMINE (ANAFRANIL) 50 MG capsule  2. 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 Attention deficit hyperactivity disorder (ADHD), predominantly inattentive type F90.0 methylphenidate 18 MG PO CR tablet  4. Insomnia due to mental condition F51.05     Past Psychiatric History: I have reviewed past psychiatric history from my progress note on 10/23/2018.  Past trials of Abilify-made him anxious, lithium, Trintellix, Depakote, mirtazapine, duloxetine.  Past Medical History:  Past Medical History:  Diagnosis Date  . Anxiety and depression 12/01/2015  . Bipolar disorder (HCC)   . Cholecystitis   . Hypertension 07/05/2017  . Insomnia 07/05/2017  . MDD (major depressive disorder), recurrent episode, moderate (HCC) 01/13/2016  . OCD (obsessive compulsive disorder) 01/13/2016    Past Surgical History:  Procedure Laterality Date  . CHOLECYSTECTOMY N/A 07/10/2017   Procedure: LAPAROSCOPIC CHOLECYSTECTOMY;  Surgeon: Ancil Linsey, MD;  Location: [facility] ORS;  Service: General;  Laterality: N/A;  . VASECTOMY    . VASECTOMY REVERSAL      Family Psychiatric History: I have reviewed family psychiatric  history from my progress note on 10/23/2018.  Family History:  Family History  Problem Relation Age of Onset  . Anxiety disorder Mother   . Depression Mother   . Alcohol abuse Father   . Anxiety disorder Sister   . Depression Sister     Social History: Reviewed social history from my progress note on 10/23/2018. Social History   Socioeconomic History  . Marital status:  Married    Spouse name: nancy  . Number of children: 3  . Years of education: Not on file  . Highest education level: Bachelor's degree (e.g., BA, AB, BS)  Occupational History  . Not on file  Social Needs  . Financial resource strain: Not hard at all  . Food insecurity:    Worry: Never true    Inability: Never true  . Transportation needs:    Medical: No    Non-medical: No  Tobacco Use  . Smoking status: Never Smoker  . Smokeless tobacco: Never Used  Substance and Sexual Activity  . Alcohol use: Yes    Alcohol/week: 23.0 standard drinks    Types: 2 Glasses of wine, 14 Cans of beer, 7 Shots of liquor per week  . Drug use: No  . Sexual activity: Yes    Partners: Female    Birth control/protection: None  Lifestyle  . Physical activity:    Days per week: 0 days    Minutes per session: 0 min  . Stress: Very much  Relationships  . Social connections:    Talks on phone: Once a week    Gets together: Never    Attends religious service: Never    Active member of club or organization: No    Attends meetings of clubs or organizations: Never    Relationship status: Married  Other Topics Concern  . Not on file  Social History Narrative  . Not on file    Allergies: No Known Allergies  Metabolic Disorder Labs: No results found for: HGBA1C, MPG No results found for: PROLACTIN No results found for: CHOL, TRIG, HDL, CHOLHDL, VLDL, LDLCALC Lab Results  Component Value Date   TSH 3.980 10/23/2018    Therapeutic Level Labs: No results found for: LITHIUM No results found for: VALPROATE No components found for:  CBMZ  Current Medications: Current Outpatient Medications  Medication Sig Dispense Refill  . clomiPRAMINE (ANAFRANIL) 50 MG capsule TAKE 1 CAPSULE(50 MG) BY MOUTH AT BEDTIME 90 capsule 0  . cyclobenzaprine (FLEXERIL) 10 MG tablet Take 10 mg by mouth 3 (three) times daily as needed for muscle spasms.     Melene Muller. [START ON 03/06/2019] methylphenidate 18 MG PO CR tablet Take  1 tablet (18 mg total) by mouth daily. 30 tablet 0  . risperiDONE (RISPERDAL) 0.5 MG tablet TAKE 1 TABLET(0.5 MG) BY MOUTH AT BEDTIME 90 tablet 0  . valACYclovir (VALTREX) 500 MG tablet TAKE 1 TABLET BY MOUTH ONCE DAILY    . valsartan (DIOVAN) 320 MG tablet Take 1 tablet by mouth daily.  11  . zolpidem (AMBIEN CR) 12.5 MG CR tablet Take 1 tablet (12.5 mg total) by mouth at bedtime as needed for sleep. 30 tablet 2   No current facility-administered medications for this visit.      Musculoskeletal: Strength & Muscle Tone: within normal limits Gait & Station: normal Patient leans: N/A  Psychiatric Specialty Exam: Review of Systems  Psychiatric/Behavioral: The patient is nervous/anxious.   All other systems reviewed and are negative.   There were no vitals taken for  this visit.There is no height or weight on file to calculate BMI.  General Appearance: Casual  Eye Contact:  Fair  Speech:  Clear and Coherent  Volume:  Normal  Mood:  Anxious improving  Affect:  Appropriate  Thought Process:  Goal Directed and Descriptions of Associations: Intact  Orientation:  Full (Time, Place, and Person)  Thought Content: Logical   Suicidal Thoughts:  No  Homicidal Thoughts:  No  Memory:  Immediate;   Fair Recent;   Fair Remote;   Fair  Judgement:  Fair  Insight:  Fair  Psychomotor Activity:  Normal  Concentration:  Concentration: Fair and Attention Span: Fair  Recall:  [employer] of Knowledge: Fair  Language: Fair  Akathisia:  No  Handed:  Right  AIMS (if indicated): denies tremors, rigidity  Assets:  Communication Skills Desire for Improvement Social Support  ADL's:  Intact  Cognition: WNL  Sleep:  Fair   Screenings:   Assessment and Plan: Jeremy Boyer is a 48 year old Caucasian male who is married, employed, lives in [place], has a history of OCD, depression, recent ADHD diagnosis was evaluated by telemedicine today.  Patient is currently making progress on the current medication  regimen and reports improvement in his mood and attention.  Plan as noted below.  Plan For OCD-improving Clomipramine 50 mg p.o. nightly Risperidone 0.5 mg p.o. nightly  For MDD-improving Risperidone 0.5 mg p.o. nightly Clomipramine 50 mg p.o. nightly  For ADHD-improving Concerta 18 mg p.o. daily.  I have given him 1 prescription with date specified to be filled on or after 03/06/2019. I have reviewed [place] controlled substance database. Patient advised to call back the clinic in a month if he needs a dosage increase on the Concerta.  Discussed to increase the Concerta to 27 mg at that time.  For insomnia-improving Ambien CR 12.5 mg p.o. nightly  Pt was advised to start psychotherapy session with Ms. Inetta Fermo Thompson-pending  Follow-up in clinic in 2 months or sooner if needed.  Appointment scheduled for July 28 at 4 PM  I have spent atleast 15 minutes non face to face with patient today. More than 50 % of the time was spent for psychoeducation and supportive psychotherapy and care coordination.  This note was generated in part or whole with voice recognition software. Voice recognition is usually quite accurate but there are transcription errors that can and very often do occur. I apologize for any typographical errors that were not detected and corrected.       Jomarie Longs, MD 02/28/2019, 2:30 PM

## 2019-02-28 NOTE — Telephone Encounter (Signed)
went online to [email or web address] and submitted the prior authorization and it was approved from  02-28-19 to  02-27-22

## 2019-04-17 ENCOUNTER — Other Ambulatory Visit (HOSPITAL_COMMUNITY): Payer: Self-pay | Admitting: Psychiatry

## 2019-04-17 ENCOUNTER — Telehealth: Payer: Self-pay

## 2019-04-17 DIAGNOSIS — F900 Attention-deficit hyperactivity disorder, predominantly inattentive type: Secondary | ICD-10-CM

## 2019-04-17 MED ORDER — METHYLPHENIDATE HCL ER 18 MG PO TB24: 18.0000 mg | ORAL_TABLET | Freq: Every day | ORAL | 0 refills | Status: DC

## 2019-04-17 NOTE — Telephone Encounter (Signed)
ordered

## 2019-04-17 NOTE — Telephone Encounter (Signed)
methylphenidate 18 MG PO CR tablet Medication Date: 02/28/2019 Department: [facility] Ordering/Authorizing: Ursula Alert, MD  Order Providers  Prescribing Provider Encounter Provider  Ursula Alert, MD Ursula Alert, MD  Outpatient Medication Detail   Disp Refills Start End   methylphenidate 18 MG PO CR tablet 30 tablet 0 03/06/2019    Sig - Route: Take 1 tablet (18 mg total) by mouth daily. - Oral   Sent to pharmacy as: methylphenidate (CONCERTA) 18 MG CR tablet   Earliest Fill Date: 03/06/2019   Notes to Pharmacy: To be filled on or after 03/06/2019   E-Prescribing Status: Receipt confirmed by pharmacy (02/28/2019 1:30 PM EDT)   Associated Diagnoses  Attention deficit hyperactivity disorder (ADHD), predominantly inattentive type     [place], [place] states he needs enough medication to get to his appt with dr. Shea Evans on  7-28-3

## 2019-04-30 ENCOUNTER — Ambulatory Visit (INDEPENDENT_AMBULATORY_CARE_PROVIDER_SITE_OTHER): Payer: BC Managed Care – PPO | Admitting: Psychiatry

## 2019-04-30 ENCOUNTER — Encounter: Payer: Self-pay | Admitting: Psychiatry

## 2019-04-30 ENCOUNTER — Other Ambulatory Visit: Payer: Self-pay

## 2019-04-30 DIAGNOSIS — F5105 Insomnia due to other mental disorder: Secondary | ICD-10-CM | POA: Diagnosis not present

## 2019-04-30 DIAGNOSIS — F900 Attention-deficit hyperactivity disorder, predominantly inattentive type: Secondary | ICD-10-CM

## 2019-04-30 DIAGNOSIS — F429 Obsessive-compulsive disorder, unspecified: Secondary | ICD-10-CM

## 2019-04-30 DIAGNOSIS — F331 Major depressive disorder, recurrent, moderate: Secondary | ICD-10-CM | POA: Diagnosis not present

## 2019-04-30 MED ORDER — RISPERIDONE 1 MG PO TABS: 1.0000 mg | ORAL_TABLET | Freq: Every day | ORAL | 1 refills | Status: DC

## 2019-04-30 NOTE — Progress Notes (Signed)
Virtual Visit via Video Note  I connected with Jeremy Boyer on 04/30/19 at  4:00 PM EDT by a video enabled telemedicine application and verified that I am speaking with the correct person using two identifiers.   I discussed the limitations of evaluation and management by telemedicine and the availability of in person appointments. The patient expressed understanding and agreed to proceed.   I discussed the assessment and treatment plan with the patient. The patient was provided an opportunity to ask questions and all were answered. The patient agreed with the plan and demonstrated an understanding of the instructions.   The patient was advised to call back or seek an in-person evaluation if the symptoms worsen or if the condition fails to improve as anticipated.   BH MD OP Progress Note  04/30/2019 5:16 PM Jeremy OvermanCullen Grant Verge  MRN:  [id number]  Chief Complaint:  Chief Complaint    Follow-up     HPI: Jeremy Boyer is a 48 year old Caucasian male, married, employed, lives in [place], has a history of MDD, OCD, ADHD, hypertension was evaluated by telemedicine today.  A video call was initiated however due to connection problem it was changed to a phone call.  Patient today reports he has been struggling with some mood lability recently.  Patient reports he struggles with some tearfulness, crying spells and feeling down and depressed for 2 to 3 days and then felt hyper and confused for couple of days.  He reports it happened without any trigger and he does not know why.  Patient reports he does not think the Concerta at this dosage is actually helping with his concentration and focus much.   He reports sleep as good.  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  Patient has not been able to make the psychotherapy appointment as discussed last visit.  Patient however reports he will call Ms. Felecia Janina Thompson soon.  Patient does report chronic  suicidality however currently denies any active thoughts or plan.  Patient denies any other concerns today. Visit Diagnosis:    ICD-10-CM   1. MDD (major depressive disorder), recurrent episode, moderate (HCC)  F33.1 risperiDONE (RISPERDAL) 1 MG tablet  2. 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 Attention deficit hyperactivity disorder (ADHD), predominantly inattentive type  F90.0   4. Insomnia due to mental condition  F51.05     Past Psychiatric History: I have reviewed past psychiatric history from my progress note on 10/23/2018.  Past trials of Abilify, lithium, Trintellix, Depakote, mirtazapine, duloxetine.  Past Medical History:  Past Medical History:  Diagnosis Date  . Anxiety and depression 12/01/2015  . Bipolar disorder (HCC)   . Cholecystitis   . Hypertension 07/05/2017  . Insomnia 07/05/2017  . MDD (major depressive disorder), recurrent episode, moderate (HCC) 01/13/2016  . OCD (obsessive compulsive disorder) 01/13/2016    Past Surgical History:  Procedure Laterality Date  . CHOLECYSTECTOMY N/A 07/10/2017   Procedure: LAPAROSCOPIC CHOLECYSTECTOMY;  Surgeon: Ancil Linseyavis, Jason Evan, MD;  Location: [facility] ORS;  Service: General;  Laterality: N/A;  . VASECTOMY    . VASECTOMY REVERSAL      Family Psychiatric History: I have reviewed family psychiatric history from my progress note on 10/23/2018.  Family History:  Family History  Problem Relation Age of Onset  . Anxiety disorder Mother   . Depression Mother   . Alcohol abuse Father   . Anxiety disorder Sister   . Depression Sister  Social History: I have reviewed social history from my progress note on 10/23/2018. Social History   Socioeconomic History  . Marital status: Married    Spouse name: nancy  . Number of children: 3  . Years of education: Not on file  . Highest education level: Bachelor's degree (e.g., BA, AB, BS)  Occupational History  . Not on file  Social Needs  . Financial  resource strain: Not hard at all  . Food insecurity    Worry: Never true    Inability: Never true  . Transportation needs    Medical: No    Non-medical: No  Tobacco Use  . Smoking status: Never Smoker  . Smokeless tobacco: Never Used  Substance and Sexual Activity  . Alcohol use: Yes    Alcohol/week: 23.0 standard drinks    Types: 2 Glasses of wine, 14 Cans of beer, 7 Shots of liquor per week  . Drug use: No  . Sexual activity: Yes    Partners: Female    Birth control/protection: None  Lifestyle  . Physical activity    Days per week: 0 days    Minutes per session: 0 min  . Stress: Very much  Relationships  . Social Herbalist on phone: Once a week    Gets together: Never    Attends religious service: Never    Active member of club or organization: No    Attends meetings of clubs or organizations: Never    Relationship status: Married  Other Topics Concern  . Not on file  Social History Narrative  . Not on file    Allergies: No Known Allergies  Metabolic Disorder Labs: No results found for: HGBA1C, MPG No results found for: PROLACTIN No results found for: CHOL, TRIG, HDL, CHOLHDL, VLDL, LDLCALC Lab Results  Component Value Date   TSH 3.980 10/23/2018    Therapeutic Level Labs: No results found for: LITHIUM No results found for: VALPROATE No components found for:  CBMZ  Current Medications: Current Outpatient Medications  Medication Sig Dispense Refill  . ALPRAZolam (XANAX) 1 MG tablet TAKE 1 TABLET BY MOUTH EVERY DAY AS NEEDED    . clomiPRAMINE (ANAFRANIL) 50 MG capsule TAKE 1 CAPSULE(50 MG) BY MOUTH AT BEDTIME 90 capsule 0  . cyclobenzaprine (FLEXERIL) 10 MG tablet Take 10 mg by mouth 3 (three) times daily as needed for muscle spasms.     . methylphenidate 18 MG PO CR tablet Take 1 tablet (18 mg total) by mouth daily. 30 tablet 0  . risperiDONE (RISPERDAL) 1 MG tablet Take 1 tablet (1 mg total) by mouth at bedtime. 30 tablet 1  . valACYclovir  (VALTREX) 500 MG tablet TAKE 1 TABLET BY MOUTH ONCE DAILY    . valsartan (DIOVAN) 320 MG tablet Take 1 tablet by mouth daily.  11  . zolpidem (AMBIEN CR) 12.5 MG CR tablet Take 1 tablet (12.5 mg total) by mouth at bedtime as needed for sleep. 30 tablet 2   No current facility-administered medications for this visit.      Musculoskeletal: Strength & Muscle Tone: UTA Gait & Station: Reports as WNL Patient leans: N/A  Psychiatric Specialty Exam: Review of Systems  Psychiatric/Behavioral: The patient is nervous/anxious.   All other systems reviewed and are negative.   There were no vitals taken for this visit.There is no height or weight on file to calculate BMI.  General Appearance: UTA  Eye Contact:  UTA  Speech:  Clear and Coherent  Volume:  Normal  Mood:  Anxious  Affect:  Appropriate  Thought Process:  Goal Directed and Descriptions of Associations: Intact  Orientation:  Full (Time, Place, and Person)  Thought Content: Logical   Suicidal Thoughts:  No  Homicidal Thoughts:  No  Memory:  Immediate;   Fair Recent;   Fair Remote;   Fair  Judgement:  Fair  Insight:  Fair  Psychomotor Activity:  Normal  Concentration:  Concentration: Fair and Attention Span: Fair  Recall:  [employer]  Fund of Knowledge: Fair  Language: Fair  Akathisia:  No  Handed:  Right  AIMS (if indicated): denies tremors, rigidity  Assets:  Communication Skills Desire for Improvement Social Support  ADL's:  Intact  Cognition: WNL  Sleep:  Fair   Screenings:   Assessment and Plan: Jeremy Boyer is a 48 year old Caucasian male who is married, employed, lives in [place], has a history of OCD, depression, ADHD, insomnia was evaluated by telemedicine today.  Patient continues to struggle with mood lability and will benefit from medication readjustment.  Plan For OCD-improving Clomipramine 50 mg p.o. nightly Risperidone as prescribed  For MDD-unstable Increase risperidone to 1 mg p.o. nightly.  Patient  does report mood lability. Continue clomipramine 50 mg p.o. nightly  For ADHD-unstable Continue Concerta 18 mg p.o. daily. Will not readjust the dosage today since he reports mood swings. Patient advised to monitor his symptoms closely.  For insomnia-improving Ambien CR 12.5 mg p.o. nightly  Patient advised to start psychotherapy sessions-with Ms. Tina Thompson-pending.  Follow-up in clinic in 1 month or sooner if needed.  August 31 at 8:45 AM  I have spent atleast 15 minutes non face to face with patient today. More than 50 % of the time was spent for psychoeducation and supportive psychotherapy and care coordination.  This note was generated in part or whole with voice recognition software. Voice recognition is usually quite accurate but there are transcription errors that can and very often do occur. I apologize for any typographical errors that were not detected and corrected.          Jomarie LongsSaramma Nyan Dufresne, MD 04/30/2019, 5:16 PM

## 2019-05-20 ENCOUNTER — Telehealth: Payer: Self-pay

## 2019-05-20 DIAGNOSIS — F900 Attention-deficit hyperactivity disorder, predominantly inattentive type: Secondary | ICD-10-CM

## 2019-05-20 NOTE — Telephone Encounter (Signed)
Pt called pt needs refill on medication  methylphenidate 18 MG PO CR tablet Medication Date: 04/17/2019 Department: [facility] Ordering/Authorizing: Norman Clay, MD  Order Providers  Prescribing Provider Encounter Provider  Norman Clay, MD Norman Clay, MD  Outpatient Medication Detail   Disp Refills Start End   methylphenidate 18 MG PO CR tablet 30 tablet 0 04/17/2019    Sig - Route: Take 1 tablet (18 mg total) by mouth daily. - Oral   Sent to pharmacy as: methylphenidate (CONCERTA) 18 MG CR tablet   Earliest Fill Date: 04/17/2019   E-Prescribing Status: Receipt confirmed by pharmacy (04/17/2019 12:18 PM EDT)   Renewals  Renewal requests to authorizing provider Modesta Messing, Elie Goody, MD) prohibited  Renewal provider: Ursula Alert, MD     Associated Diagnoses  Attention deficit hyperactivity disorder (ADHD), predominantly inattentive type     Pharmacy  [place], [place]

## 2019-05-21 MED ORDER — METHYLPHENIDATE HCL ER 18 MG PO TB24: 18.0000 mg | ORAL_TABLET | Freq: Every day | ORAL | 0 refills | Status: DC

## 2019-05-21 NOTE — Telephone Encounter (Signed)
Sent Methylphenidate to pharmacy

## 2019-05-29 ENCOUNTER — Other Ambulatory Visit: Payer: Self-pay | Admitting: Psychiatry

## 2019-05-29 DIAGNOSIS — F429 Obsessive-compulsive disorder, unspecified: Secondary | ICD-10-CM

## 2019-05-29 DIAGNOSIS — F331 Major depressive disorder, recurrent, moderate: Secondary | ICD-10-CM

## 2019-06-03 ENCOUNTER — Ambulatory Visit (INDEPENDENT_AMBULATORY_CARE_PROVIDER_SITE_OTHER): Payer: BC Managed Care – PPO | Admitting: Psychiatry

## 2019-06-03 ENCOUNTER — Other Ambulatory Visit: Payer: Self-pay

## 2019-06-03 DIAGNOSIS — Z5329 Procedure and treatment not carried out because of patient's decision for other reasons: Secondary | ICD-10-CM

## 2019-06-03 NOTE — Progress Notes (Signed)
No response to call 

## 2019-06-28 ENCOUNTER — Other Ambulatory Visit: Payer: Self-pay | Admitting: Psychiatry

## 2019-06-28 DIAGNOSIS — F331 Major depressive disorder, recurrent, moderate: Secondary | ICD-10-CM

## 2019-07-01 ENCOUNTER — Telehealth: Payer: Self-pay

## 2019-07-01 DIAGNOSIS — F900 Attention-deficit hyperactivity disorder, predominantly inattentive type: Secondary | ICD-10-CM

## 2019-07-01 MED ORDER — METHYLPHENIDATE HCL ER 18 MG PO TB24: 18.0000 mg | ORAL_TABLET | Freq: Every day | ORAL | 0 refills | Status: DC

## 2019-07-01 NOTE — Telephone Encounter (Signed)
Sent Concerta to pharmacy. 

## 2019-07-01 NOTE — Telephone Encounter (Signed)
methylphenidate 18 MG PO CR tablet Medication Date: 05/21/2019 Department: [facility] Ordering/Authorizing: Ursula Alert, MD  Order Providers  Prescribing Provider Encounter Provider  Ursula Alert, MD Carlynn Purl, CMA  Outpatient Medication Detail   Disp Refills Start End   methylphenidate 18 MG PO CR tablet 30 tablet 0 05/21/2019    Sig - Route: Take 1 tablet (18 mg total) by mouth daily. - Oral   Sent to pharmacy as: methylphenidate (CONCERTA) 18 MG CR tablet   Earliest Fill Date: 05/21/2019   E-Prescribing Status: Receipt confirmed by pharmacy (05/21/2019 12:11 PM EDT)   Associated Diagnoses  Attention deficit hyperactivity disorder (ADHD), predominantly inattentive type      Pt needs refills

## 2019-07-03 ENCOUNTER — Encounter: Payer: Self-pay | Admitting: Psychiatry

## 2019-07-03 ENCOUNTER — Other Ambulatory Visit: Payer: Self-pay

## 2019-07-03 ENCOUNTER — Ambulatory Visit (INDEPENDENT_AMBULATORY_CARE_PROVIDER_SITE_OTHER): Payer: BC Managed Care – PPO | Admitting: Psychiatry

## 2019-07-03 DIAGNOSIS — F331 Major depressive disorder, recurrent, moderate: Secondary | ICD-10-CM

## 2019-07-03 DIAGNOSIS — F900 Attention-deficit hyperactivity disorder, predominantly inattentive type: Secondary | ICD-10-CM

## 2019-07-03 DIAGNOSIS — F5105 Insomnia due to other mental disorder: Secondary | ICD-10-CM | POA: Diagnosis not present

## 2019-07-03 DIAGNOSIS — F429 Obsessive-compulsive disorder, unspecified: Secondary | ICD-10-CM | POA: Diagnosis not present

## 2019-07-03 NOTE — Progress Notes (Signed)
Virtual Visit via Video Note  I connected with Jeremy Boyer on 07/03/19 at  1:30 PM EDT by a video enabled telemedicine application and verified that I am speaking with the correct person using two identifiers.   I discussed the limitations of evaluation and management by telemedicine and the availability of in person appointments. The patient expressed understanding and agreed to proceed.     I discussed the assessment and treatment plan with the patient. The patient was provided an opportunity to ask questions and all were answered. The patient agreed with the plan and demonstrated an understanding of the instructions.   The patient was advised to call back or seek an in-person evaluation if the symptoms worsen or if the condition fails to improve as anticipated.   BH MD OP Progress Note  07/03/2019 2:38 PM [name]  MRN:  [id number]  Chief Complaint:  Chief Complaint    Follow-up     HPI: Jeremy Boyer is a 48 year old Caucasian male, is married, employed, lives in [place], has a history of MDD, OCD, ADHD, hypertension was evaluated by telemedicine today.  A video call was initiated however due to connection problem it had to be changed to a phone call.  Patient today reports he is currently making progress on the current medication regimen.  Patient reports he has not had any significant mood lability recently.  He reports sleep is good on the Ambien.  He reports he is able to focus and pay attention on the Concerta at this dosage.  He denies any side effects.  He reports appetite is fair.  He denies any suicidality, homicidality or perceptual disturbances.  Patient denies any other concerns today. Visit Diagnosis:    ICD-10-CM   1. MDD (major depressive disorder), recurrent episode, moderate (HCC)  F33.1    stable  2. Obsessive-compulsive disorder with good or fair insight  F42.9    stable  3. Attention deficit hyperactivity disorder (ADHD), predominantly  inattentive type  F90.0   4. Insomnia due to mental condition  F51.05     Past Psychiatric History: I have reviewed past psychiatric history from my progress note on 10/23/2018.  Past trials of Abilify, lithium, Trintellix, Depakote, mirtazapine, duloxetine  Past Medical History:  Past Medical History:  Diagnosis Date  . Anxiety and depression 12/01/2015  . Bipolar disorder (HCC)   . Cholecystitis   . Hypertension 07/05/2017  . Insomnia 07/05/2017  . MDD (major depressive disorder), recurrent episode, moderate (HCC) 01/13/2016  . OCD (obsessive compulsive disorder) 01/13/2016    Past Surgical History:  Procedure Laterality Date  . CHOLECYSTECTOMY N/A 07/10/2017   Procedure: LAPAROSCOPIC CHOLECYSTECTOMY;  Surgeon: Ancil Linsey, MD;  Location: [facility] ORS;  Service: General;  Laterality: N/A;  . VASECTOMY    . VASECTOMY REVERSAL      Family Psychiatric History: I have reviewed family psychiatric history from my progress note on 10/23/2018.  Family History:  Family History  Problem Relation Age of Onset  . Anxiety disorder Mother   . Depression Mother   . Alcohol abuse Father   . Anxiety disorder Sister   . Depression Sister     Social History: I have reviewed social history from my progress note on 10/23/2018. Social History   Socioeconomic History  . Marital status: Married    Spouse name: nancy  . Number of children: 3  . Years of education: Not on file  . Highest education level: Bachelor's degree (e.g., BA, AB, BS)  Occupational History  .  Not on file  Social Needs  . Financial resource strain: Not hard at all  . Food insecurity    Worry: Never true    Inability: Never true  . Transportation needs    Medical: No    Non-medical: No  Tobacco Use  . Smoking status: Never Smoker  . Smokeless tobacco: Never Used  Substance and Sexual Activity  . Alcohol use: Yes    Alcohol/week: 23.0 standard drinks    Types: 2 Glasses of wine, 14 Cans of beer, 7 Shots of liquor  per week  . Drug use: No  . Sexual activity: Yes    Partners: Female    Birth control/protection: None  Lifestyle  . Physical activity    Days per week: 0 days    Minutes per session: 0 min  . Stress: Very much  Relationships  . Social Herbalist on phone: Once a week    Gets together: Never    Attends religious service: Never    Active member of club or organization: No    Attends meetings of clubs or organizations: Never    Relationship status: Married  Other Topics Concern  . Not on file  Social History Narrative  . Not on file    Allergies: No Known Allergies  Metabolic Disorder Labs: No results found for: HGBA1C, MPG No results found for: PROLACTIN No results found for: CHOL, TRIG, HDL, CHOLHDL, VLDL, LDLCALC Lab Results  Component Value Date   TSH 3.980 10/23/2018    Therapeutic Level Labs: No results found for: LITHIUM No results found for: VALPROATE No components found for:  CBMZ  Current Medications: Current Outpatient Medications  Medication Sig Dispense Refill  . ALPRAZolam (XANAX) 1 MG tablet TAKE 1 TABLET BY MOUTH EVERY DAY AS NEEDED    . clomiPRAMINE (ANAFRANIL) 50 MG capsule TAKE 1 CAPSULE(50 MG) BY MOUTH AT BEDTIME 90 capsule 0  . cyclobenzaprine (FLEXERIL) 10 MG tablet Take 10 mg by mouth 3 (three) times daily as needed for muscle spasms.     . methylphenidate 18 MG PO CR tablet Take 1 tablet (18 mg total) by mouth daily. 30 tablet 0  . risperiDONE (RISPERDAL) 1 MG tablet TAKE 1 TABLET(1 MG) BY MOUTH AT BEDTIME 30 tablet 1  . valACYclovir (VALTREX) 500 MG tablet TAKE 1 TABLET BY MOUTH ONCE DAILY    . valsartan (DIOVAN) 320 MG tablet Take 1 tablet by mouth daily.  11  . zolpidem (AMBIEN CR) 12.5 MG CR tablet Take 1 tablet (12.5 mg total) by mouth at bedtime as needed for sleep. 30 tablet 2   No current facility-administered medications for this visit.      Musculoskeletal: Strength & Muscle Tone: UTA Gait & Station: Reports as  WNL Patient leans: N/A  Psychiatric Specialty Exam: Review of Systems  Psychiatric/Behavioral: Negative for depression, hallucinations and substance abuse. The patient is not nervous/anxious.   All other systems reviewed and are negative.   There were no vitals taken for this visit.There is no height or weight on file to calculate BMI.  General Appearance: UTA  Eye Contact:  UTA  Speech:  Clear and Coherent  Volume:  Normal  Mood:  Euthymic  Affect:  UTA  Thought Process:  Goal Directed and Descriptions of Associations: Intact  Orientation:  Full (Time, Place, and Person)  Thought Content: Logical   Suicidal Thoughts:  No  Homicidal Thoughts:  No  Memory:  Immediate;   Fair Recent;   Fair Remote;  Fair  Judgement:  Fair  Insight:  Fair  Psychomotor Activity:  UTA  Concentration:  Concentration: Fair and Attention Span: Fair  Recall:  [employer]  Fund of Knowledge: Fair  Language: Fair  Akathisia:  No  Handed:  Right  AIMS (if indicated): Denies tremors, rigidity  Assets:  Communication Skills Desire for Improvement  ADL's:  Intact  Cognition: WNL  Sleep:  Fair   Screenings:   Assessment and Plan:: Quillian QuinceCullen is a 48 year old Caucasian male, married, employed, lives in [place], has a history of OCD, depression, ADHD, insomnia was evaluated by telemedicine today.  Patient is currently making progress on the current medication regimen.  Plan as noted below.  Plan For OCD-stable Clomipramine 50 mg p.o. nightly Risperidone as prescribed  MDD-improving Risperidone 1 mg p.o. nightly Clomipramine 50 mg p.o. nightly  ADHD-stable Concerta 18 mg p.o. daily  Insomnia-stable Ambien CR 12.5 mg p.o. nightly  Patient was referred for psychotherapy sessions with Ms. Inetta Fermoina Thompson-pending  Follow-up in clinic in 6 weeks or sooner if needed.  November 19 at 4 PM  I have spent atleast 15 minutes non  face to face with patient today. More than 50 % of the time was spent for  psychoeducation and supportive psychotherapy and care coordination. This note was generated in part or whole with voice recognition software. Voice recognition is usually quite accurate but there are transcription errors that can and very often do occur. I apologize for any typographical errors that were not detected and corrected.       Jomarie LongsSaramma Sahid Borba, MD 07/03/2019, 2:38 PM

## 2019-07-25 ENCOUNTER — Other Ambulatory Visit: Payer: Self-pay | Admitting: Psychiatry

## 2019-07-25 DIAGNOSIS — F331 Major depressive disorder, recurrent, moderate: Secondary | ICD-10-CM

## 2019-08-09 IMAGING — CR DG CHEST 2V
2 series · 2 of 2 positions shown · non-contrast
Comparison: 10/24/2016

CLINICAL DATA: Upper abdominal, epigastric, and left upper quadrant
pain and tenderness. Chest pain.

EXAM:
CHEST  2 VIEW

[chest pa]
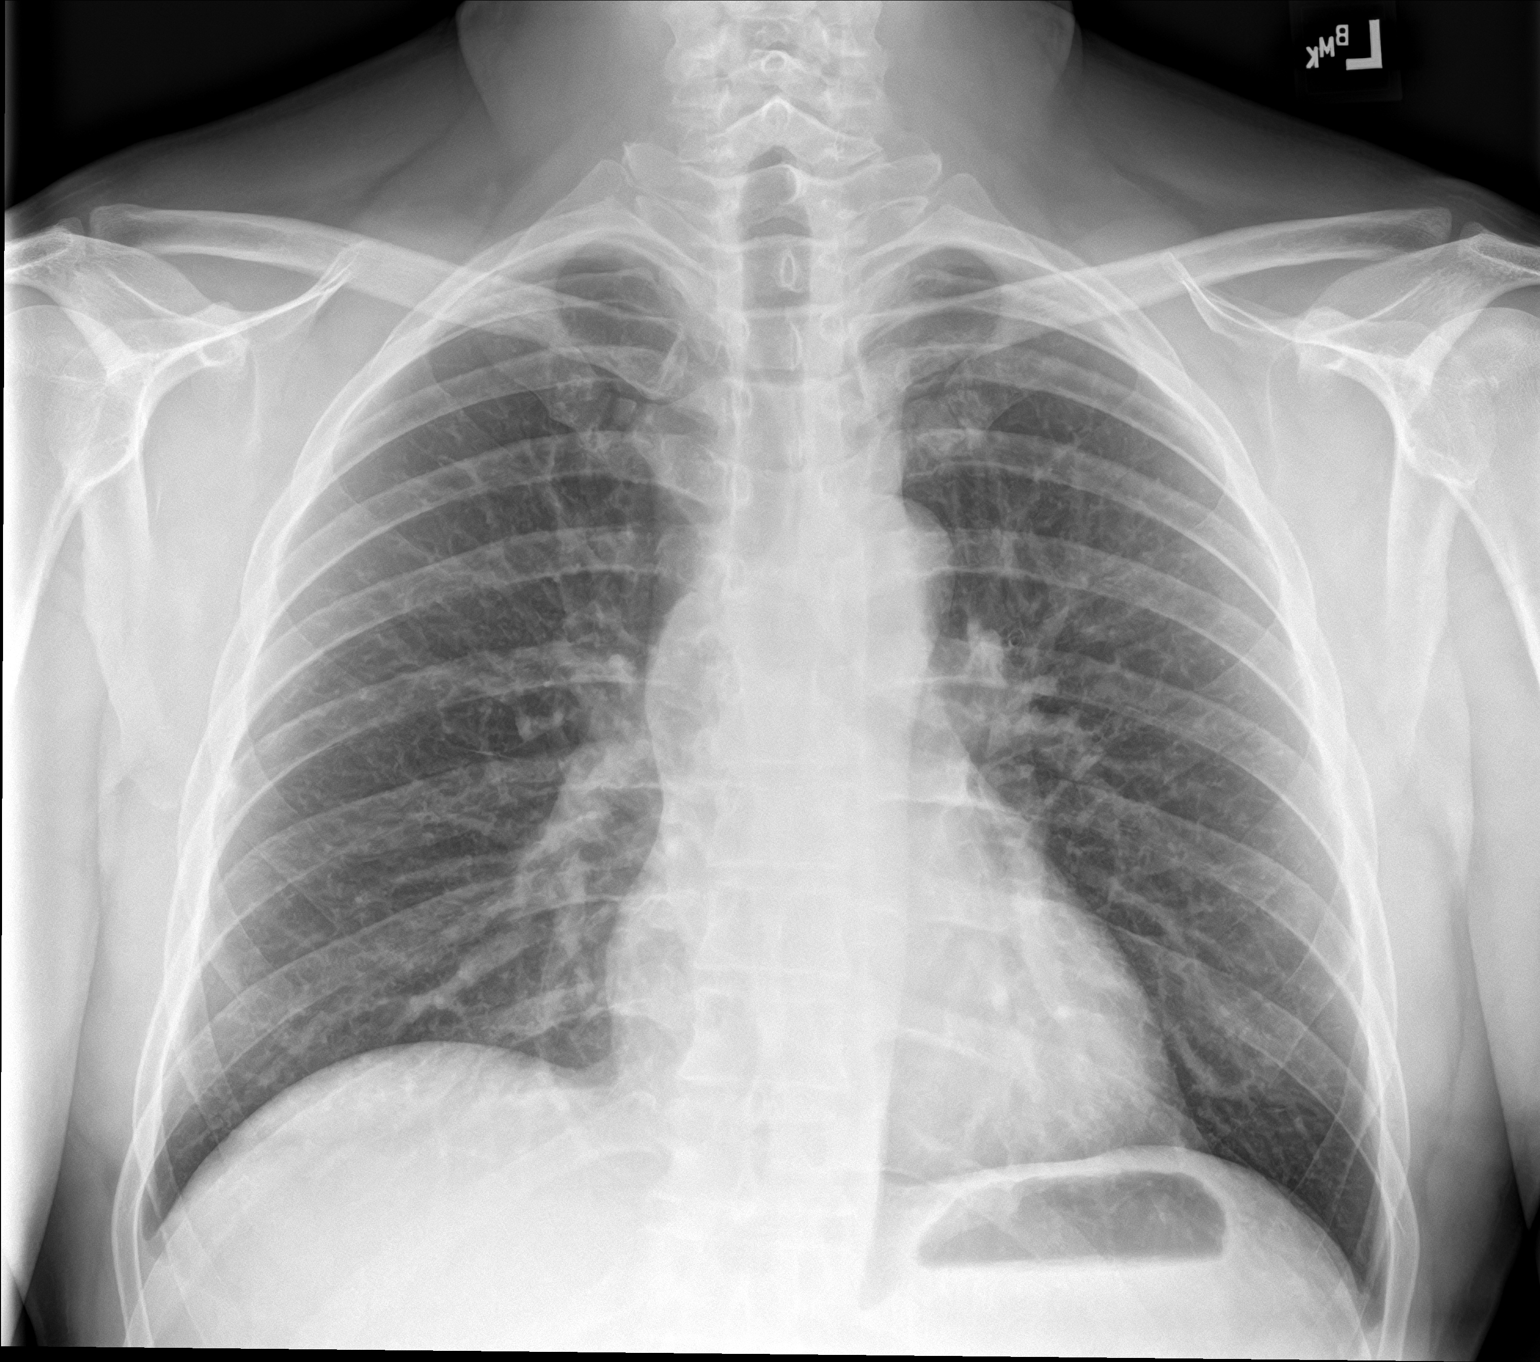

[chest lat]
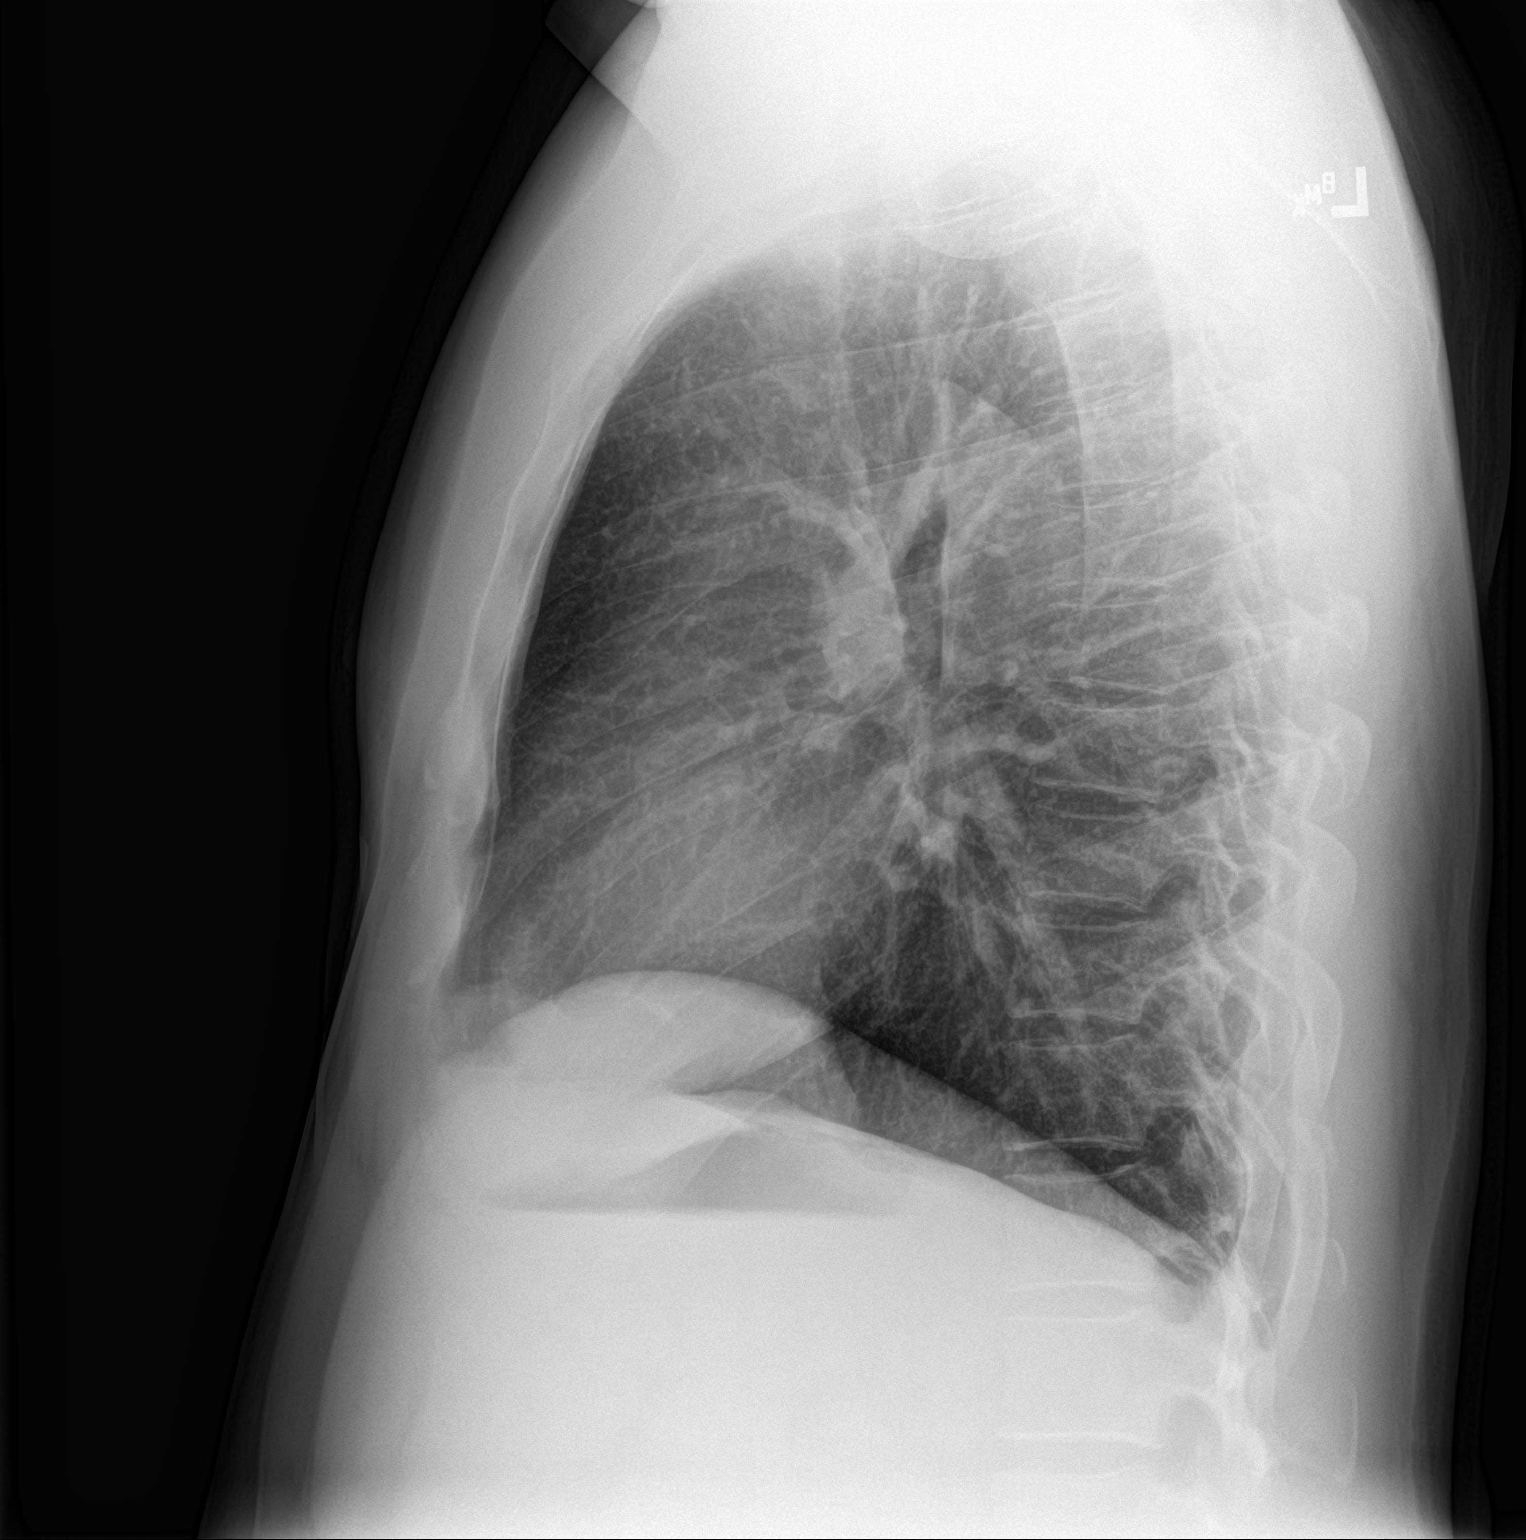

[2 of 2 positions shown; findings below may reference images not displayed]

FINDINGS: The heart size and mediastinal contours are within normal limits.
Both lungs are clear. The visualized skeletal structures are
unremarkable.
IMPRESSION: No active cardiopulmonary disease.

## 2019-08-20 ENCOUNTER — Telehealth: Payer: Self-pay

## 2019-08-20 DIAGNOSIS — F900 Attention-deficit hyperactivity disorder, predominantly inattentive type: Secondary | ICD-10-CM

## 2019-08-20 NOTE — Telephone Encounter (Signed)
pt called states he needs refill on medication methylphenidate.    methylphenidate 18 MG PO CR tablet Medication Date: 07/01/2019 Department: [facility] Ordering/Authorizing: Ursula Alert, MD  Order Providers  Prescribing Provider Encounter Provider  Ursula Alert, MD Carlynn Purl, CMA  Outpatient Medication Detail   Disp Refills Start End   methylphenidate 18 MG PO CR tablet 30 tablet 0 07/01/2019    Sig - Route: Take 1 tablet (18 mg total) by mouth daily. - Oral   Sent to pharmacy as: methylphenidate (CONCERTA) 18 MG CR tablet   Earliest Fill Date: 07/01/2019   E-Prescribing Status: Receipt confirmed by pharmacy (07/01/2019 4:49 PM EDT)   Associated Diagnoses  Attention deficit hyperactivity disorder (ADHD), predominantly inattentive type     [place] #[id number] Lorina Rabon, [place]

## 2019-08-20 NOTE — Telephone Encounter (Signed)
received a copy of the notes from [place] attention

## 2019-08-21 MED ORDER — METHYLPHENIDATE HCL ER 18 MG PO TB24: 18.0000 mg | ORAL_TABLET | Freq: Every day | ORAL | 0 refills | Status: DC

## 2019-08-21 NOTE — Telephone Encounter (Signed)
Sent Concerta to pharmacy. 

## 2019-08-22 ENCOUNTER — Encounter: Payer: Self-pay | Admitting: Psychiatry

## 2019-08-22 ENCOUNTER — Ambulatory Visit (INDEPENDENT_AMBULATORY_CARE_PROVIDER_SITE_OTHER): Payer: BC Managed Care – PPO | Admitting: Psychiatry

## 2019-08-22 ENCOUNTER — Other Ambulatory Visit: Payer: Self-pay

## 2019-08-22 DIAGNOSIS — F5105 Insomnia due to other mental disorder: Secondary | ICD-10-CM

## 2019-08-22 DIAGNOSIS — F3342 Major depressive disorder, recurrent, in full remission: Secondary | ICD-10-CM | POA: Diagnosis not present

## 2019-08-22 DIAGNOSIS — F900 Attention-deficit hyperactivity disorder, predominantly inattentive type: Secondary | ICD-10-CM | POA: Diagnosis not present

## 2019-08-22 DIAGNOSIS — F429 Obsessive-compulsive disorder, unspecified: Secondary | ICD-10-CM | POA: Diagnosis not present

## 2019-08-22 MED ORDER — RISPERIDONE 1 MG PO TABS: ORAL_TABLET | ORAL | 1 refills | Status: DC

## 2019-08-22 MED ORDER — METHYLPHENIDATE HCL ER 18 MG PO TB24: 18.0000 mg | ORAL_TABLET | Freq: Every day | ORAL | 0 refills | Status: DC

## 2019-08-22 NOTE — Progress Notes (Signed)
Virtual Visit via Video Note  I connected with Jeremy Boyer on 08/22/19 at  4:00 PM EST by a video enabled telemedicine application and verified that I am speaking with the correct person using two identifiers.   I discussed the limitations of evaluation and management by telemedicine and the availability of in person appointments. The patient expressed understanding and agreed to proceed.    I discussed the assessment and treatment plan with the patient. The patient was provided an opportunity to ask questions and all were answered. The patient agreed with the plan and demonstrated an understanding of the instructions.   The patient was advised to call back or seek an in-person evaluation if the symptoms worsen or if the condition fails to improve as anticipated.   BH MD OP Progress Note  08/22/2019 4:58 PM Jeremy Boyer  MRN:  [id number]  Chief Complaint:  Chief Complaint    Follow-up     HPI: Jeremy Boyer is a 48 year old Caucasian male, married, employed, lives in [place], has a history of MDD , OCD, ADHD, hypertension was evaluated by telemedicine today.  Patient today reports he is currently doing well with regards to his mood symptoms.  He denies any sadness, lack of motivation or anhedonia.  He reports sleep is good on the Ambien.  He reports he is able to focus and is currently doing well on the Concerta.  He reports he ran out of the Concerta few days ago and definitely noticed a big difference.  Patient denies any suicidality, homicidality or perceptual disturbances.  Patient reports his obsessions have improved and he does not have any compulsive behaviors at this time.  He reports he is able to distract himself and focus on other activities.  Patient denies any other concerns today. Visit Diagnosis:    ICD-10-CM   1. MDD (major depressive disorder), recurrent, in full remission (HCC)  F33.42 risperiDONE (RISPERDAL) 1 MG tablet  2. Obsessive-compulsive  disorder with good or fair insight  F42.9    stable  3. Attention deficit hyperactivity disorder (ADHD), predominantly inattentive type  F90.0 methylphenidate 18 MG PO CR tablet   stable  4. Insomnia due to mental condition  F51.05    stable    Past Psychiatric History: Reviewed past psychiatric history from my progress note on 10/23/2018.  Past trials of Abilify, lithium, Trintellix, Depakote, mirtazapine, duloxetine  Past Medical History:  Past Medical History:  Diagnosis Date  . Anxiety and depression 12/01/2015  . Bipolar disorder (HCC)   . Cholecystitis   . Hypertension 07/05/2017  . Insomnia 07/05/2017  . MDD (major depressive disorder), recurrent episode, moderate (HCC) 01/13/2016  . OCD (obsessive compulsive disorder) 01/13/2016    Past Surgical History:  Procedure Laterality Date  . CHOLECYSTECTOMY N/A 07/10/2017   Procedure: LAPAROSCOPIC CHOLECYSTECTOMY;  Surgeon: Ancil Linsey, MD;  Location: [facility] ORS;  Service: General;  Laterality: N/A;  . VASECTOMY    . VASECTOMY REVERSAL      Family Psychiatric History: I have reviewed family psychiatric history from my progress note on 10/23/2018.  Family History:  Family History  Problem Relation Age of Onset  . Anxiety disorder Mother   . Depression Mother   . Alcohol abuse Father   . Anxiety disorder Sister   . Depression Sister     Social History: I have reviewed social history from my progress note on 10/23/2018. Social History   Socioeconomic History  . Marital status: Married    Spouse name: nancy  . Number  of children: 3  . Years of education: Not on file  . Highest education level: Bachelor's degree (e.g., BA, AB, BS)  Occupational History  . Not on file  Social Needs  . Financial resource strain: Not hard at all  . Food insecurity    Worry: Never true    Inability: Never true  . Transportation needs    Medical: No    Non-medical: No  Tobacco Use  . Smoking status: Never Smoker  . Smokeless tobacco:  Never Used  Substance and Sexual Activity  . Alcohol use: Yes    Alcohol/week: 23.0 standard drinks    Types: 2 Glasses of wine, 14 Cans of beer, 7 Shots of liquor per week  . Drug use: No  . Sexual activity: Yes    Partners: Female    Birth control/protection: None  Lifestyle  . Physical activity    Days per week: 0 days    Minutes per session: 0 min  . Stress: Very much  Relationships  . Social Herbalist on phone: Once a week    Gets together: Never    Attends religious service: Never    Active member of club or organization: No    Attends meetings of clubs or organizations: Never    Relationship status: Married  Other Topics Concern  . Not on file  Social History Narrative  . Not on file    Allergies: No Known Allergies  Metabolic Disorder Labs: No results found for: HGBA1C, MPG No results found for: PROLACTIN No results found for: CHOL, TRIG, HDL, CHOLHDL, VLDL, LDLCALC Lab Results  Component Value Date   TSH 3.980 10/23/2018    Therapeutic Level Labs: No results found for: LITHIUM No results found for: VALPROATE No components found for:  CBMZ  Current Medications: Current Outpatient Medications  Medication Sig Dispense Refill  . ALPRAZolam (XANAX) 1 MG tablet TAKE 1 TABLET BY MOUTH EVERY DAY AS NEEDED    . clomiPRAMINE (ANAFRANIL) 50 MG capsule TAKE 1 CAPSULE(50 MG) BY MOUTH AT BEDTIME 90 capsule 0  . cyclobenzaprine (FLEXERIL) 10 MG tablet Take 10 mg by mouth 3 (three) times daily as needed for muscle spasms.     . methylphenidate 18 MG PO CR tablet Take 1 tablet (18 mg total) by mouth daily. 30 tablet 0  . methylphenidate 18 MG PO CR tablet Take 18 mg by mouth every morning.    Derrill Memo ON 09/22/2019] methylphenidate 18 MG PO CR tablet Take 1 tablet (18 mg total) by mouth daily. 30 tablet 0  . risperiDONE (RISPERDAL) 1 MG tablet TAKE 1 TABLET(1 MG) BY MOUTH AT BEDTIME 30 tablet 1  . valACYclovir (VALTREX) 500 MG tablet TAKE 1 TABLET BY MOUTH  ONCE DAILY    . valsartan (DIOVAN) 320 MG tablet Take 1 tablet by mouth daily.  11  . zolpidem (AMBIEN CR) 12.5 MG CR tablet TAKE 1 TABLET(12.5 MG) BY MOUTH AT BEDTIME AS NEEDED FOR SLEEP 30 tablet 3   No current facility-administered medications for this visit.      Musculoskeletal: Strength & Muscle Tone: UTA Gait & Station: normal Patient leans: N/A  Psychiatric Specialty Exam: Review of Systems  Psychiatric/Behavioral: Negative for depression, hallucinations, substance abuse and suicidal ideas. The patient is not nervous/anxious and does not have insomnia.   All other systems reviewed and are negative.   There were no vitals taken for this visit.There is no height or weight on file to calculate BMI.  General Appearance:  Casual  Eye Contact:  Fair  Speech:  Clear and Coherent  Volume:  Normal  Mood:  Euthymic  Affect:  Appropriate  Thought Process:  Goal Directed and Descriptions of Associations: Intact  Orientation:  Full (Time, Place, and Person)  Thought Content: Logical   Suicidal Thoughts:  No  Homicidal Thoughts:  No  Memory:  Immediate;   Fair Recent;   Fair Remote;   Fair  Judgement:  Fair  Insight:  Fair  Psychomotor Activity:  Normal  Concentration:  Concentration: Fair and Attention Span: Fair  Recall:  [employer]Fair  Fund of Knowledge: Fair  Language: Fair  Akathisia:  No  Handed:  Right  AIMS (if indicated): Denies tremors, rigidity  Assets:  Communication Skills Desire for Improvement Resilience Social Support Talents/Skills Transportation  ADL's:  Intact  Cognition: WNL  Sleep:  Fair   Screenings:   Assessment and Plan::Marcellius is a 48 year old Caucasian male, married, employed, lives in [place], has a history of OCD, depression, ADHD, insomnia was evaluated by telemedicine today.  Patient is currently doing well on the current medication regimen.  Plan as noted below.  Plan MDD in remission Continue clomipramine 50 mg p.o. nightly Risperidone  as prescribed  OCD-stable Clomipramine 50 mg p.o. nightly Risperidone as prescribed  ADHD-stable Concerta 18 mg p.o. daily I have provided 2 prescriptions with date specified last 1 to be filled on or after 09/22/2019  Insomnia-stable Ambien CR 12.5 mg p.o. nightly  Patient was referred for psychotherapy sessions to Ms. Tina Thompson-noncompliant.  Follow-up in clinic in 2 months or sooner if needed.  January 5 at 4:20 PM  I have spent atleast 15 minutes non face to face with patient today. More than 50 % of the time was spent for psychoeducation and supportive psychotherapy and care coordination. This note was generated in part or whole with voice recognition software. Voice recognition is usually quite accurate but there are transcription errors that can and very often do occur. I apologize for any typographical errors that were not detected and corrected.       Jeremy LongsSaramma Derrian Poli, MD 08/22/2019, 4:58 PM

## 2019-08-30 ENCOUNTER — Other Ambulatory Visit: Payer: Self-pay | Admitting: Psychiatry

## 2019-08-30 DIAGNOSIS — F429 Obsessive-compulsive disorder, unspecified: Secondary | ICD-10-CM

## 2019-08-30 DIAGNOSIS — F331 Major depressive disorder, recurrent, moderate: Secondary | ICD-10-CM

## 2019-10-08 ENCOUNTER — Ambulatory Visit (INDEPENDENT_AMBULATORY_CARE_PROVIDER_SITE_OTHER): Payer: BC Managed Care – PPO | Admitting: Psychiatry

## 2019-10-08 ENCOUNTER — Encounter: Payer: Self-pay | Admitting: Psychiatry

## 2019-10-08 ENCOUNTER — Other Ambulatory Visit: Payer: Self-pay

## 2019-10-08 DIAGNOSIS — F5105 Insomnia due to other mental disorder: Secondary | ICD-10-CM | POA: Diagnosis not present

## 2019-10-08 DIAGNOSIS — F900 Attention-deficit hyperactivity disorder, predominantly inattentive type: Secondary | ICD-10-CM | POA: Diagnosis not present

## 2019-10-08 DIAGNOSIS — T50905A Adverse effect of unspecified drugs, medicaments and biological substances, initial encounter: Secondary | ICD-10-CM | POA: Insufficient documentation

## 2019-10-08 DIAGNOSIS — F331 Major depressive disorder, recurrent, moderate: Secondary | ICD-10-CM

## 2019-10-08 DIAGNOSIS — F429 Obsessive-compulsive disorder, unspecified: Secondary | ICD-10-CM

## 2019-10-08 DIAGNOSIS — F3342 Major depressive disorder, recurrent, in full remission: Secondary | ICD-10-CM | POA: Insufficient documentation

## 2019-10-08 MED ORDER — RISPERIDONE 0.5 MG PO TABS: 0.5000 mg | ORAL_TABLET | Freq: Every day | ORAL | 0 refills | Status: DC

## 2019-10-08 MED ORDER — BUPROPION HCL ER (SR) 100 MG PO TB12: 100.0000 mg | ORAL_TABLET | Freq: Every day | ORAL | 0 refills | Status: DC

## 2019-10-08 MED ORDER — CLOMIPRAMINE HCL 25 MG PO CAPS: 25.0000 mg | ORAL_CAPSULE | Freq: Every day | ORAL | 0 refills | Status: DC

## 2019-10-08 NOTE — Progress Notes (Signed)
Virtual Visit via Video Note  I connected with Jeremy Jeremy Boyer on 10/08/19 at  4:20 PM EST by a video enabled telemedicine application and verified that I am speaking with the correct person using two identifiers.   I discussed the limitations of evaluation and management by telemedicine and the availability of in person appointments. The patient expressed understanding and agreed to proceed.    I discussed the assessment and treatment plan with the patient. The patient was provided an opportunity to ask questions and all were answered. The patient agreed with the plan and demonstrated an understanding of the instructions.   The patient was advised to call back or seek an in-person evaluation if the symptoms worsen or if the condition fails to improve as anticipated.  BH MD OP Progress Note  10/08/2019 5:19 PM Jeremy Jeremy Boyer  MRN:  [id number]  Chief Complaint:  Chief Complaint    Follow-up     HPI: Jeremy Jeremy Boyer is a 49 year old Caucasian male, married, employed, lives in [place], has a history of MDD, OCD, ADHD, hypertension was evaluated by telemedicine today.  Patient today reports he is currently struggling with depressive symptoms.  He reports his depressive symptoms comes and goes and in the past few days it is getting worse.  He struggles with low mood often.  He also struggles with lack of motivation.  He reports as long as he takes the Ambien his sleep is good.  Patient reports his ability to focus is going well on the Concerta.  However he reports his blood pressure has been going up recently and he does have upcoming appointment with his primary care provider for evaluation.  Patient also reports that his OCD symptoms as improving.  His repetitive thoughts are better on the current combination of medication.  Patient also reports he is struggling with possible sexual side effects of medications.  He reports he struggles with inability to have an erection as well as  ejaculate.  Patient reports he had similar problems when he was on medication called Paxil in the past.  It had gotten better however since the past few months he believes it is getting worse again.  Patient denies any suicidality, homicidality or perceptual disturbances.  Patient reports he has not been able to connect with a therapist however reports he is motivated to do so.  Patient denies any other concerns today. Visit Diagnosis:    ICD-10-CM   1. MDD (major depressive disorder), recurrent episode, moderate (HCC)  F33.1 clomiPRAMINE (ANAFRANIL) 25 MG capsule    risperiDONE (RISPERDAL) 0.5 MG tablet    buPROPion (WELLBUTRIN SR) 100 MG 12 hr tablet  2. 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 Attention deficit hyperactivity disorder (ADHD), predominantly inattentive type  F90.0   4. Insomnia due to mental condition  F51.05   5. Adverse effect of drug, initial encounter  T50.905A     Past Psychiatric History: Reviewed past psychiatric history from my progress note on 10/23/2018.  Past trials of Paxil,Abilify, lithium, Trintellix, Depakote, mirtazapine, duloxetine.  Past Medical History:  Past Medical History:  Diagnosis Date  . Anxiety and depression 12/01/2015  . Bipolar disorder (HCC)   . Cholecystitis   . Hypertension 07/05/2017  . Insomnia 07/05/2017  . MDD (major depressive disorder), recurrent episode, moderate (HCC) 01/13/2016  . OCD (obsessive compulsive disorder) 01/13/2016    Past Surgical History:  Procedure Laterality Date  .  CHOLECYSTECTOMY N/A 07/10/2017   Procedure: LAPAROSCOPIC CHOLECYSTECTOMY;  Surgeon: Ancil Linsey, MD;  Location: [facility] ORS;  Service: General;  Laterality: N/A;  . VASECTOMY    . VASECTOMY REVERSAL      Family Psychiatric History: Reviewed family psychiatric history from my progress note on 10/23/2018.  Family History:  Family History   Problem Relation Age of Onset  . Anxiety disorder Mother   . Depression Mother   . Alcohol abuse Father   . Anxiety disorder Sister   . Depression Sister     Social History: Reviewed social history from my progress note on 10/23/2018. Social History   Socioeconomic History  . Marital status: Married    Spouse name: nancy  . Number of children: 3  . Years of education: Not on file  . Highest education level: Bachelor's degree (e.g., BA, AB, BS)  Occupational History  . Not on file  Tobacco Use  . Smoking status: Never Smoker  . Smokeless tobacco: Never Used  Substance and Sexual Activity  . Alcohol use: Yes    Alcohol/week: 23.0 standard drinks    Types: 2 Glasses of wine, 14 Cans of beer, 7 Shots of liquor per week  . Drug use: No  . Sexual activity: Yes    Partners: Female    Birth control/protection: None  Other Topics Concern  . Not on file  Social History Narrative  . Not on file   Social Determinants of Health   Financial Resource Strain:   . Difficulty of Paying Living Expenses: Not on file  Food Insecurity:   . Worried About Programme researcher, broadcasting/film/video in the Last Year: Not on file  . Ran Out of Food in the Last Year: Not on file  Transportation Needs:   . Lack of Transportation (Medical): Not on file  . Lack of Transportation (Non-Medical): Not on file  Physical Activity:   . Days of Exercise per Week: Not on file  . Minutes of Exercise per Session: Not on file  Stress:   . Feeling of Stress : Not on file  Social Connections:   . Frequency of Communication with Friends and Family: Not on file  . Frequency of Social Gatherings with Friends and Family: Not on file  . Attends Religious Services: Not on file  . Active Member of Clubs or Organizations: Not on file  . Attends Banker Meetings: Not on file  . Marital Status: Not on file    Allergies: No Known Allergies  Metabolic Disorder Labs: No results found for: HGBA1C, MPG No results found  for: PROLACTIN No results found for: CHOL, TRIG, HDL, CHOLHDL, VLDL, LDLCALC Lab Results  Component Value Date   TSH 3.980 10/23/2018    Therapeutic Level Labs: No results found for: LITHIUM No results found for: VALPROATE No components found for:  CBMZ  Current Medications: Current Outpatient Medications  Medication Sig Dispense Refill  . ALPRAZolam (XANAX) 1 MG tablet TAKE 1 TABLET BY MOUTH EVERY DAY AS NEEDED    . buPROPion (WELLBUTRIN SR) 100 MG 12 hr tablet Take 1 tablet (100 mg total) by mouth daily. Start taking once daily for 2 weeks and increase to twice a day 180 tablet 0  . clomiPRAMINE (ANAFRANIL) 25 MG capsule Take 1 capsule (25 mg total) by mouth at bedtime. 90 capsule 0  . cyclobenzaprine (FLEXERIL) 10 MG tablet Take 10 mg by mouth 3 (three) times daily as needed for muscle spasms.     . methylphenidate 18  MG PO CR tablet Take 1 tablet (18 mg total) by mouth daily. 30 tablet 0  . methylphenidate 18 MG PO CR tablet Take 18 mg by mouth every morning.    . methylphenidate 18 MG PO CR tablet Take 1 tablet (18 mg total) by mouth daily. 30 tablet 0  . risperiDONE (RISPERDAL) 0.5 MG tablet Take 1 tablet (0.5 mg total) by mouth at bedtime. 90 tablet 0  . valACYclovir (VALTREX) 500 MG tablet TAKE 1 TABLET BY MOUTH ONCE DAILY    . valsartan (DIOVAN) 320 MG tablet Take 1 tablet by mouth daily.  11  . zolpidem (AMBIEN CR) 12.5 MG CR tablet TAKE 1 TABLET(12.5 MG) BY MOUTH AT BEDTIME AS NEEDED FOR SLEEP 30 tablet 3   No current facility-administered medications for this visit.     Musculoskeletal: Strength & Muscle Tone: UTA Gait & Station: normal Patient leans: N/A  Psychiatric Specialty Exam: Review of Systems  Endocrine:       Sexual dysfunction  Psychiatric/Behavioral: Positive for dysphoric mood.  All other systems reviewed and are negative.   There were no vitals taken for this visit.There is no height or weight on file to calculate BMI.  General Appearance: Casual   Eye Contact:  Fair  Speech:  Clear and Coherent  Volume:  Normal  Mood:  Depressed  Affect:  Congruent  Thought Process:  Goal Directed and Descriptions of Associations: Intact  Orientation:  Full (Time, Place, and Person)  Thought Content: Logical   Suicidal Thoughts:  No  Homicidal Thoughts:  No  Memory:  Immediate;   Fair Recent;   Fair Remote;   Fair  Judgement:  Fair  Insight:  Fair  Psychomotor Activity:  Normal  Concentration:  Concentration: Fair and Attention Span: Fair  Recall:  [employer] of Knowledge: Fair  Language: Fair  Akathisia:  No  Handed:  Right  AIMS (if indicated): Denies tremors, rigidity  Assets:  Communication Skills Desire for Improvement Housing Intimacy Social Support  ADL's:  Intact  Cognition: WNL  Sleep:  Fair   Screenings:   Assessment and Plan: Trevar is a 49 year old Caucasian male, married, employed, lives in [place], has a history of OCD, depression, ADHD, insomnia was evaluated by telemedicine today.  Patient is currently struggling with depressive symptoms as well as possible side effects to his medications.  Discussed plan as noted below.  Plan MDD-unstable We will reduce clomipramine to 25 mg p.o. nightly for side effects. Reduce risperidone to 0.5 mg p.o. nightly Start Wellbutrin SR 100 mg p.o. daily for 2 weeks and increase to 100 mg p.o. twice daily after that. Will refer patient for psychotherapy sessions-I have sent communication to our referral coordinator Ms. Brown to schedule patient with our therapist.  OCD-stable Clomipramine and risperidone as prescribed. Refer for CBT.  ADHD-stable Concerta 18 mg p.o. daily. Patient does have 1 prescription pending at the pharmacy. I have reviewed  controlled substance database. However since patient's blood pressure is currently elevated-discussed with patient to hold off on Concerta until he has an evaluation with his primary care provider for the  same.  Insomnia-stable Ambien CR 12.5 mg p.o. nightly  For adverse side effects of medication-sexual side effects Start Wellbutrin as prescribed. I have also advised patient to contact his primary care provider for evaluation and possible labs.  Follow-up in clinic in 4 weeks or sooner if needed.  February 1 at 8:30 AM  I have spent atleast 30 minutes non  face to face  with patient today. More than 50 % of the time was spent for ordering medications and test ,psychoeducation and supportive psychotherapy and care coordination,as well as documenting clinical information in electronic health record.  This note was generated in part or whole with voice recognition software. Voice recognition is usually quite accurate but there are transcription errors that can and very often do occur. I apologize for any typographical errors that were not detected and corrected.       Jomarie Longs, MD 10/08/2019, 5:19 PM

## 2019-10-10 ENCOUNTER — Telehealth: Payer: Self-pay

## 2019-10-10 DIAGNOSIS — F900 Attention-deficit hyperactivity disorder, predominantly inattentive type: Secondary | ICD-10-CM

## 2019-10-10 MED ORDER — METHYLPHENIDATE HCL ER 18 MG PO TB24: 18.0000 mg | ORAL_TABLET | Freq: Every day | ORAL | 0 refills | Status: DC

## 2019-10-10 NOTE — Telephone Encounter (Signed)
pt called states he needs refill on methylphenidate 18mg 

## 2019-10-10 NOTE — Telephone Encounter (Signed)
Sent Concerta to pharmacy.

## 2019-10-15 ENCOUNTER — Other Ambulatory Visit: Payer: Self-pay | Admitting: Psychiatry

## 2019-10-15 DIAGNOSIS — F3342 Major depressive disorder, recurrent, in full remission: Secondary | ICD-10-CM

## 2019-11-04 ENCOUNTER — Ambulatory Visit (INDEPENDENT_AMBULATORY_CARE_PROVIDER_SITE_OTHER): Payer: BC Managed Care – PPO | Admitting: Psychiatry

## 2019-11-04 ENCOUNTER — Other Ambulatory Visit: Payer: Self-pay

## 2019-11-04 ENCOUNTER — Encounter: Payer: Self-pay | Admitting: Psychiatry

## 2019-11-04 DIAGNOSIS — F429 Obsessive-compulsive disorder, unspecified: Secondary | ICD-10-CM | POA: Diagnosis not present

## 2019-11-04 DIAGNOSIS — F900 Attention-deficit hyperactivity disorder, predominantly inattentive type: Secondary | ICD-10-CM

## 2019-11-04 DIAGNOSIS — F5105 Insomnia due to other mental disorder: Secondary | ICD-10-CM

## 2019-11-04 DIAGNOSIS — Z79899 Other long term (current) drug therapy: Secondary | ICD-10-CM

## 2019-11-04 DIAGNOSIS — F331 Major depressive disorder, recurrent, moderate: Secondary | ICD-10-CM

## 2019-11-04 MED ORDER — METHYLPHENIDATE HCL ER (OSM) 18 MG PO TBCR: 18.0000 mg | EXTENDED_RELEASE_TABLET | Freq: Every morning | ORAL | 0 refills | Status: DC

## 2019-11-04 NOTE — Progress Notes (Signed)
Provider Location : [facility] Patient Location : Home   Virtual Visit via Video Note  I connected with Jeremy Boyer on 11/04/19 at  8:30 AM EST by a video enabled telemedicine application and verified that I am speaking with the correct person using two identifiers.   I discussed the limitations of evaluation and management by telemedicine and the availability of in person appointments. The patient expressed understanding and agreed to proceed.     I discussed the assessment and treatment plan with the patient. The patient was provided an opportunity to ask questions and all were answered. The patient agreed with the plan and demonstrated an understanding of the instructions.   The patient was advised to call back or seek an in-person evaluation if the symptoms worsen or if the condition fails to improve as anticipated.   BH MD OP Progress Note  11/04/2019 12:31 PM Jeremy Boyer  MRN:  [id number]  Chief Complaint:  Chief Complaint    Follow-up     HPI: Jeremy Boyer is a 49 year old Caucasian male, married, employed, lives in [place], has a history of MDD, OCD, ADHD, hypertension was evaluated by telemedicine today.  Video call was initiated however due to connection problem it had to be changed to a phone call.  Patient today reports he is currently making some progress on the Wellbutrin.  His depressive symptoms and motivation have improved.  He continues to report OCD symptoms as improving.  His repetitive thoughts are better.  Patient however continues to struggle with sexual side effects of medications.  He reports he is tolerating the lower dosages of risperidone and clomipramine and is willing to taper off more.  Discussed discontinuing risperidone.  He continues to take Concerta for his ADHD symptoms.  He reports attention and concentration as good.  He however reports he recently was at his primary care office, his blood pressure continues to be high as well as he  struggles with sexual dysfunction.  He is currently awaiting lab work-up.  His blood pressure medications were readjusted.  He continues to need follow-up.  Discussed with patient about getting an EKG and the effect of Concerta on his hypertension.  Patient reports sleep is good.  Patient denies any suicidality, homicidality or perceptual disturbances.  Patient denies any other concerns today. Visit Diagnosis:    ICD-10-CM   1. MDD (major depressive disorder), recurrent episode, moderate (HCC)  F33.1   2. 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 Attention deficit hyperactivity disorder (ADHD), predominantly inattentive type  F90.0 methylphenidate 18 MG PO CR tablet  4. Insomnia due to mental condition  F51.05   5. High risk medication use  Z79.899 EKG 12-Lead    Past Psychiatric History: I have reviewed past psychiatric history from my progress note on 10/23/2018.  Past trials of Paxil, Abilify, lithium, Trintellix, Depakote, mirtazapine, duloxetine, risperidone. Past Medical History:  Past Medical History:  Diagnosis Date  . Anxiety and depression 12/01/2015  . Bipolar disorder (HCC)   . Cholecystitis   . Hypertension 07/05/2017  . Insomnia 07/05/2017  . MDD (major depressive disorder), recurrent episode, moderate (HCC) 01/13/2016  . OCD (obsessive compulsive disorder) 01/13/2016    Past Surgical History:  Procedure Laterality Date  . CHOLECYSTECTOMY N/A 07/10/2017   Procedure: LAPAROSCOPIC CHOLECYSTECTOMY;  Surgeon: Ancil Linsey, MD;  Location: [facility] ORS;  Service: General;  Laterality: N/A;  . VASECTOMY    . VASECTOMY REVERSAL      Family Psychiatric History: Reviewed  family psychiatric history from my progress note on 10/23/2018.  Family History:  Family History  Problem Relation Age of Onset  . Anxiety disorder Mother   . Depression Mother   . Alcohol abuse Father   . Anxiety disorder Sister   . Depression Sister     Social History: Reviewed  social history from my progress note on 10/23/2018. Social History   Socioeconomic History  . Marital status: Married    Spouse name: nancy  . Number of children: 3  . Years of education: Not on file  . Highest education level: Bachelor's degree (e.g., BA, AB, BS)  Occupational History  . Not on file  Tobacco Use  . Smoking status: Never Smoker  . Smokeless tobacco: Never Used  Substance and Sexual Activity  . Alcohol use: Yes    Alcohol/week: 23.0 standard drinks    Types: 2 Glasses of wine, 14 Cans of beer, 7 Shots of liquor per week  . Drug use: No  . Sexual activity: Yes    Partners: Female    Birth control/protection: None  Other Topics Concern  . Not on file  Social History Narrative  . Not on file   Social Determinants of Health   Financial Resource Strain:   . Difficulty of Paying Living Expenses: Not on file  Food Insecurity:   . Worried About Programme researcher, broadcasting/film/video in the Last Year: Not on file  . Ran Out of Food in the Last Year: Not on file  Transportation Needs:   . Lack of Transportation (Medical): Not on file  . Lack of Transportation (Non-Medical): Not on file  Physical Activity:   . Days of Exercise per Week: Not on file  . Minutes of Exercise per Session: Not on file  Stress:   . Feeling of Stress : Not on file  Social Connections:   . Frequency of Communication with Friends and Family: Not on file  . Frequency of Social Gatherings with Friends and Family: Not on file  . Attends Religious Services: Not on file  . Active Member of Clubs or Organizations: Not on file  . Attends Banker Meetings: Not on file  . Marital Status: Not on file    Allergies: No Known Allergies  Metabolic Disorder Labs: No results found for: HGBA1C, MPG No results found for: PROLACTIN No results found for: CHOL, TRIG, HDL, CHOLHDL, VLDL, LDLCALC Lab Results  Component Value Date   TSH 3.980 10/23/2018    Therapeutic Level Labs: No results found for:  LITHIUM No results found for: VALPROATE No components found for:  CBMZ  Current Medications: Current Outpatient Medications  Medication Sig Dispense Refill  . amLODipine (NORVASC) 5 MG tablet Take by mouth.    . ALPRAZolam (XANAX) 1 MG tablet TAKE 1 TABLET BY MOUTH EVERY DAY AS NEEDED    . amLODipine (NORVASC) 5 MG tablet Take 5 mg by mouth daily.    Marland Kitchen buPROPion (WELLBUTRIN SR) 100 MG 12 hr tablet Take 1 tablet (100 mg total) by mouth daily. Start taking once daily for 2 weeks and increase to twice a day 180 tablet 0  . clomiPRAMINE (ANAFRANIL) 25 MG capsule Take 1 capsule (25 mg total) by mouth at bedtime. 90 capsule 0  . cyclobenzaprine (FLEXERIL) 10 MG tablet Take 10 mg by mouth 3 (three) times daily as needed for muscle spasms.     . methylphenidate 18 MG PO CR tablet Take 1 tablet (18 mg total) by mouth daily. 30 tablet  0  . [START ON 11/08/2019] methylphenidate 18 MG PO CR tablet Take 1 tablet (18 mg total) by mouth every morning. 30 tablet 0  . valACYclovir (VALTREX) 500 MG tablet TAKE 1 TABLET BY MOUTH ONCE DAILY    . valsartan (DIOVAN) 320 MG tablet Take 1 tablet by mouth daily.  11  . zolpidem (AMBIEN CR) 12.5 MG CR tablet TAKE 1 TABLET(12.5 MG) BY MOUTH AT BEDTIME AS NEEDED FOR SLEEP 30 tablet 3   No current facility-administered medications for this visit.     Musculoskeletal: Strength & Muscle Tone: UTA Gait & Station: normal Patient leans: N/A  Psychiatric Specialty Exam: Review of Systems  Psychiatric/Behavioral: Positive for dysphoric mood. The patient is nervous/anxious.   All other systems reviewed and are negative.   There were no vitals taken for this visit.There is no height or weight on file to calculate BMI.  General Appearance: UTA  Eye Contact:  UTA  Speech:  Clear and Coherent  Volume:  Normal  Mood:  Anxious and Depressed  Affect:  UTA  Thought Process:  Goal Directed and Descriptions of Associations: Intact  Orientation:  Full (Time, Place, and  Person)  Thought Content: Logical   Suicidal Thoughts:  No  Homicidal Thoughts:  No  Memory:  Immediate;   Fair Recent;   Fair Remote;   Fair  Judgement:  Fair  Insight:  Fair  Psychomotor Activity:  UTA  Concentration:  Concentration: Fair and Attention Span: Fair  Recall:  [employer] of Knowledge: Fair  Language: Fair  Akathisia:  No  Handed:  Right  AIMS (if indicated): Denies tremors, rigidity  Assets:  Communication Skills Desire for Improvement Housing Social Support  ADL's:  Intact  Cognition: WNL  Sleep:  Fair   Screenings:   Assessment and Plan: Jeremy Boyer is a 49 year old Caucasian male, married, employed, lives in [place], has a history of OCD, depression, ADHD, insomnia was evaluated by telemedicine today.  Patient is currently struggling with depressive symptoms although progressing.  He continues to struggle with possible side effects of medications.  Plan as noted below.  Plan MDD-improving Continue clomipramine 25 mg p.o. nightly-reduced dosage. Discontinue risperidone for side effects. Increase Wellbutrin SR to 100 mg p.o. twice daily. Patient has been referred for psychotherapy sessions-pending  OCD-stable Clomipramine as prescribed. Referred for CBT  ADHD-stable Concerta 18 mg p.o. daily I have reviewed [place] controlled substance database. Will order EKG. I have reviewed medical records in [facility] per his provider Wayland Denis PA -dated 10/10/2019' patient with concerns of elevated blood pressure.  For essential hypertension-patient's blood pressure ranging 150s/90s outside of office.  He has been off his valsartan for the past 2 months.  He has tried losartan and HCTZ in the past without improvement.  We will try switching to amlodipine 5 mg p.o. daily.  He will likely need to add a second agent.  He will notify me through my chart in 2 weeks.  Labs ordered-testosterone-for decreased libido, CBC with differential.  LDL/lipid panel, CMP, hemoglobin A1c,  TSH.  Insomnia-stable Ambien CR 12.5 mg p.o. nightly  Patient advised to continue to work with his primary care provider for elevated blood pressure, sexual side effects.  For the sexual side effects his medications has been readjusted.  Follow-up in clinic in 4 weeks or sooner if needed.  March 2 at 4:20 PM  I have spent atleast 30 minutes non face to face with patient today. More than 50 % of the time was spent  for preparing to see the patient ( e.g., review of test, records ), obtaining and to review and separately obtained history , ordering medications and test ,psychoeducation and supportive psychotherapy and care coordination,as well as documenting clinical information in electronic health record,interpreting results of test and communication of results This note was generated in part or whole with voice recognition software. Voice recognition is usually quite accurate but there are transcription errors that can and very often do occur. I apologize for any typographical errors that were not detected and corrected.        Jomarie Longs, MD 11/04/2019, 12:31 PM

## 2019-11-30 ENCOUNTER — Ambulatory Visit: Payer: BC Managed Care – PPO | Attending: Internal Medicine

## 2019-11-30 ENCOUNTER — Ambulatory Visit: Payer: BC Managed Care – PPO

## 2019-11-30 DIAGNOSIS — Z23 Encounter for immunization: Secondary | ICD-10-CM | POA: Insufficient documentation

## 2019-11-30 NOTE — Progress Notes (Signed)
   Covid-19 Vaccination Clinic  Name:  Jeremy Boyer    MRN: [id number] DOB: December 23, 1970  11/30/2019  [name] was observed post Covid-19 immunization for 15 minutes without incidence. He was provided with Vaccine Information Sheet and instruction to access the V-Safe system.   [name] was instructed to call 911 with any severe reactions post vaccine: Marland Kitchen Difficulty breathing  . Swelling of your face and throat  . A fast heartbeat  . A bad rash all over your body  . Dizziness and weakness    Immunizations Administered    Name Date Dose VIS Date Route   Moderna COVID-19 Vaccine 11/30/2019  2:30 PM 0.5 mL 09/03/2019 Intramuscular   Manufacturer: Moderna   Lot: [id number]   NDC: 04591-368-59

## 2019-12-03 ENCOUNTER — Other Ambulatory Visit: Payer: Self-pay

## 2019-12-03 ENCOUNTER — Ambulatory Visit (INDEPENDENT_AMBULATORY_CARE_PROVIDER_SITE_OTHER): Payer: BC Managed Care – PPO | Admitting: Psychiatry

## 2019-12-03 ENCOUNTER — Encounter: Payer: Self-pay | Admitting: Psychiatry

## 2019-12-03 DIAGNOSIS — F429 Obsessive-compulsive disorder, unspecified: Secondary | ICD-10-CM | POA: Diagnosis not present

## 2019-12-03 DIAGNOSIS — F5105 Insomnia due to other mental disorder: Secondary | ICD-10-CM | POA: Diagnosis not present

## 2019-12-03 DIAGNOSIS — F331 Major depressive disorder, recurrent, moderate: Secondary | ICD-10-CM

## 2019-12-03 DIAGNOSIS — F900 Attention-deficit hyperactivity disorder, predominantly inattentive type: Secondary | ICD-10-CM | POA: Diagnosis not present

## 2019-12-03 MED ORDER — METHYLPHENIDATE HCL ER (OSM) 18 MG PO TBCR: 18.0000 mg | EXTENDED_RELEASE_TABLET | Freq: Every morning | ORAL | 0 refills | Status: DC

## 2019-12-03 MED ORDER — ZOLPIDEM TARTRATE ER 12.5 MG PO TBCR: EXTENDED_RELEASE_TABLET | ORAL | 3 refills | Status: DC

## 2019-12-03 MED ORDER — BUPROPION HCL ER (SR) 150 MG PO TB12: 150.0000 mg | ORAL_TABLET | Freq: Two times a day (BID) | ORAL | 1 refills | Status: DC

## 2019-12-03 NOTE — Progress Notes (Signed)
Provider Location : [facility] Patient Location : Home  Virtual Visit via Video Note  I connected with Jeremy Boyer on 12/03/19 at  4:20 PM EST by a video enabled telemedicine application and verified that I am speaking with the correct person using two identifiers.   I discussed the limitations of evaluation and management by telemedicine and the availability of in person appointments. The patient expressed understanding and agreed to proceed.     I discussed the assessment and treatment plan with the patient. The patient was provided an opportunity to ask questions and all were answered. The patient agreed with the plan and demonstrated an understanding of the instructions.   The patient was advised to call back or seek an in-person evaluation if the symptoms worsen or if the condition fails to improve as anticipated.  BH MD OP Progress Note  12/03/2019 4:42 PM Jeremy Boyer  MRN:  [id number]  Chief Complaint:  Chief Complaint    Follow-up     HPI: Jeremy Boyer is a 49 year old Caucasian male, married, employed, lives in [place], has a history of MDD, OCD, ADHD, hypertension was evaluated by telemedicine today.  Patient today reports he is currently struggling with depressive symptoms.  The increased dosage of Wellbutrin may have helped to some extent.  However he reports he continues to struggle with sadness, low mood. This has been going on since the past few weeks.  Patient reports sleep is good.  He reports he is able to sleep through the night and wakes up feeling rested.  Patient reports his energy level may have also improved since the past few weeks, since the increase of Wellbutrin.  He continues to take Concerta for his ADHD symptoms.  He reports he has high blood pressure and is currently following up with his providers for medication management.  He has not been able to get an EKG done however he agrees to get it done when he goes back to see his providers.  His  antihypertensive medications were recently readjusted.  Patient reports his sexual side effects which likely may have been due to his risperidone have improved.  He has upcoming appointment with therapist and he is motivated to start psychotherapy sessions.  Patient denies any suicidality, homicidality or perceptual disturbances.  He does report he is anxious about upcoming school reopening.  He was able to get his first COVID-19 vaccination and tolerated it well.   Visit Diagnosis:    ICD-10-CM   1. MDD (major depressive disorder), recurrent episode, moderate (HCC)  F33.1 buPROPion (WELLBUTRIN SR) 150 MG 12 hr tablet    zolpidem (AMBIEN CR) 12.5 MG CR tablet  2. 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 Attention deficit hyperactivity disorder (ADHD), predominantly inattentive type  F90.0 methylphenidate 18 MG PO CR tablet  4. Insomnia due to mental condition  F51.05     Past Psychiatric History: I have reviewed past psychiatric history from my progress note on 10/23/2018.  Past trials of Paxil, Abilify,, Trintellix, Depakote, mirtazapine, duloxetine, risperidone  Past Medical History:  Past Medical History:  Diagnosis Date  . Anxiety and depression 12/01/2015  . Bipolar disorder (HCC)   . Cholecystitis   . Hypertension 07/05/2017  . Insomnia 07/05/2017  . MDD (major depressive disorder), recurrent episode, moderate (HCC) 01/13/2016  . OCD (obsessive compulsive disorder) 01/13/2016    Past Surgical History:  Procedure Laterality Date  . CHOLECYSTECTOMY N/A 07/10/2017   Procedure: LAPAROSCOPIC CHOLECYSTECTOMY;  Surgeon: Ancil Linsey,  MD;  Location: [facility] ORS;  Service: General;  Laterality: N/A;  . VASECTOMY    . VASECTOMY REVERSAL      Family Psychiatric History: I have reviewed family psychiatric history from my progress note on 10/23/2018  Family History:  Family History  Problem Relation Age of Onset  . Anxiety disorder Mother   . Depression  Mother   . Alcohol abuse Father   . Anxiety disorder Sister   . Depression Sister     Social History: Reviewed social history from my progress note on 10/23/2018 Social History   Socioeconomic History  . Marital status: Married    Spouse name: nancy  . Number of children: 3  . Years of education: Not on file  . Highest education level: Bachelor's degree (e.g., BA, AB, BS)  Occupational History  . Not on file  Tobacco Use  . Smoking status: Never Smoker  . Smokeless tobacco: Never Used  Substance and Sexual Activity  . Alcohol use: Yes    Alcohol/week: 23.0 standard drinks    Types: 2 Glasses of wine, 14 Cans of beer, 7 Shots of liquor per week  . Drug use: No  . Sexual activity: Yes    Partners: Female    Birth control/protection: None  Other Topics Concern  . Not on file  Social History Narrative  . Not on file   Social Determinants of Health   Financial Resource Strain:   . Difficulty of Paying Living Expenses: Not on file  Food Insecurity:   . Worried About Programme researcher, broadcasting/film/video in the Last Year: Not on file  . Ran Out of Food in the Last Year: Not on file  Transportation Needs:   . Lack of Transportation (Medical): Not on file  . Lack of Transportation (Non-Medical): Not on file  Physical Activity:   . Days of Exercise per Week: Not on file  . Minutes of Exercise per Session: Not on file  Stress:   . Feeling of Stress : Not on file  Social Connections:   . Frequency of Communication with Friends and Family: Not on file  . Frequency of Social Gatherings with Friends and Family: Not on file  . Attends Religious Services: Not on file  . Active Member of Clubs or Organizations: Not on file  . Attends Banker Meetings: Not on file  . Marital Status: Not on file    Allergies: No Known Allergies  Metabolic Disorder Labs: No results found for: HGBA1C, MPG No results found for: PROLACTIN No results found for: CHOL, TRIG, HDL, CHOLHDL, VLDL,  LDLCALC Lab Results  Component Value Date   TSH 3.980 10/23/2018    Therapeutic Level Labs: No results found for: LITHIUM No results found for: VALPROATE No components found for:  CBMZ  Current Medications: Current Outpatient Medications  Medication Sig Dispense Refill  . amLODipine (NORVASC) 10 MG tablet Take by mouth.    . rosuvastatin (CRESTOR) 10 MG tablet Take by mouth.    . ALPRAZolam (XANAX) 1 MG tablet TAKE 1 TABLET BY MOUTH EVERY DAY AS NEEDED    . buPROPion (WELLBUTRIN SR) 150 MG 12 hr tablet Take 1 tablet (150 mg total) by mouth 2 (two) times daily. 60 tablet 1  . clomiPRAMINE (ANAFRANIL) 25 MG capsule Take 1 capsule (25 mg total) by mouth at bedtime. 90 capsule 0  . cyclobenzaprine (FLEXERIL) 10 MG tablet Take 10 mg by mouth 3 (three) times daily as needed for muscle spasms.     Marland Kitchen  methylphenidate 18 MG PO CR tablet Take 1 tablet (18 mg total) by mouth daily. 30 tablet 0  . [START ON 12/07/2019] methylphenidate 18 MG PO CR tablet Take 1 tablet (18 mg total) by mouth every morning. 30 tablet 0  . rosuvastatin (CRESTOR) 10 MG tablet Take 10 mg by mouth at bedtime.    . valACYclovir (VALTREX) 500 MG tablet TAKE 1 TABLET BY MOUTH ONCE DAILY    . valsartan (DIOVAN) 320 MG tablet Take 1 tablet by mouth daily.  11  . zolpidem (AMBIEN CR) 12.5 MG CR tablet TAKE 1 TABLET(12.5 MG) BY MOUTH AT BEDTIME AS NEEDED FOR SLEEP 30 tablet 3   No current facility-administered medications for this visit.     Musculoskeletal: Strength & Muscle Tone: UTA Gait & Station: normal Patient leans: N/A  Psychiatric Specialty Exam: Review of Systems  Psychiatric/Behavioral: Positive for dysphoric mood.  All other systems reviewed and are negative.   There were no vitals taken for this visit.There is no height or weight on file to calculate BMI.  General Appearance: Casual  Eye Contact:  Fair  Speech:  Normal Rate  Volume:  Normal  Mood:  Depressed  Affect:  Congruent  Thought Process:  Goal  Directed and Descriptions of Associations: Intact  Orientation:  Full (Time, Place, and Person)  Thought Content: Logical   Suicidal Thoughts:  No  Homicidal Thoughts:  No  Memory:  Immediate;   Fair Recent;   Fair Remote;   Fair  Judgement:  Fair  Insight:  Fair  Psychomotor Activity:  Normal  Concentration:  Concentration: Fair and Attention Span: Fair  Recall:  [employer] of Knowledge: Fair  Language: Fair  Akathisia:  No  Handed:  Right  AIMS (if indicated): UTA  Assets:  Communication Skills Desire for Improvement Housing Social Support  ADL's:  Intact  Cognition: WNL  Sleep:  Fair   Screenings:   Assessment and Plan: Jeremy Boyer is a 49 year old Caucasian male, married, employed, lives in [place], has a history of OCD, depression, ADHD, insomnia was evaluated by telemedicine today.  Patient currently struggles with depressive symptoms although making progress on the Wellbutrin.  He does have psychosocial stressors of the current pandemic, school reopening.  Patient will benefit from medication readjustment and psychotherapy sessions.  Plan as noted below.  Plan MDD-some progress Clomipramine 25 mg p.o. nightly-reduced dosage Increase Wellbutrin SR to 150 mg p.o. twice daily. Patient has been referred for psychotherapy sessions-patient has upcoming appointment with Mr. Maye Hides  OCD-stable Clomipramine as prescribed Referred for CBT-we will coordinate care with his therapist.  ADHD-stable Concerta 18 mg p.o. daily I have reviewed [employer] controlled substance database Discussed with patient the effect of Concerta on his blood pressure.  Patient advised to hold the Concerta if his blood pressure continues to be uncontrolled.  Patient is on Wellbutrin which may also help with his ADHD symptoms. EKG ordered-pending-patient encouraged to get EKG done.  Insomnia-stable Ambien CR 12.5 mg p.o. nightly  Follow-up in clinic in 1 month or sooner if needed.  I have spent  atleast 20 minutes non face to face with patient today. More than 50 % of the time was spent for preparing to see the patient ( e.g., review of test, records ), ordering medications and test ,psychoeducation and supportive psychotherapy and care coordination,as well as documenting clinical information in electronic health record,interpreting results of test and communication of results This note was generated in part or whole with voice recognition software. Voice  recognition is usually quite accurate but there are transcription errors that can and very often do occur. I apologize for any typographical errors that were not detected and corrected.      Jomarie Longs, MD 12/03/2019, 4:42 PM

## 2019-12-17 ENCOUNTER — Other Ambulatory Visit: Payer: Self-pay

## 2019-12-17 ENCOUNTER — Ambulatory Visit (INDEPENDENT_AMBULATORY_CARE_PROVIDER_SITE_OTHER): Payer: BC Managed Care – PPO | Admitting: Clinical

## 2019-12-17 DIAGNOSIS — F331 Major depressive disorder, recurrent, moderate: Secondary | ICD-10-CM

## 2019-12-17 DIAGNOSIS — F902 Attention-deficit hyperactivity disorder, combined type: Secondary | ICD-10-CM

## 2019-12-17 DIAGNOSIS — F419 Anxiety disorder, unspecified: Secondary | ICD-10-CM

## 2019-12-17 DIAGNOSIS — F429 Obsessive-compulsive disorder, unspecified: Secondary | ICD-10-CM | POA: Diagnosis not present

## 2019-12-17 NOTE — Progress Notes (Signed)
Virtual Visit via Telephone Note  I connected with Jeremy Boyer on 12/17/19 at  8:00 AM EDT by telephone and verified that I am speaking with the correct person using two identifiers.  Location: Patient: Home Provider: Office    I discussed the limitations, risks, security and privacy concerns of performing an evaluation and management service by telephone and the availability of in person appointments. I also discussed with the patient that there may be a patient responsible charge related to this service. The patient expressed understanding and agreed to proceed.      Comprehensive Clinical Assessment (CCA) Note  12/17/2019 Jeremy Boyer [id number]  Visit Diagnosis:      ICD-10-CM   1. Recurrent moderate major depressive disorder with anxiety (HCC)  F33.1    F41.9   2. 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 Attention deficit hyperactivity disorder (ADHD), combined type  F90.2       CCA Part One  Part One has been completed on paper by the patient.  (See scanned document in Chart Review)  CCA Part Two A  Intake/Chief Complaint:  CCA Intake With Chief Complaint CCA Part Two Date: 12/17/19 Chief Complaint/Presenting Problem: The patient notes, " My priority is Depression". Patients Currently Reported Symptoms/Problems: The patient notes, " I have had problem with depression my whole life, my thinking is negative i have difficulty with feeling positive". Collateral Involvement: None Individual's Strengths: The patient notes, " I communicate well with others". 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 Type of Services Patient Feels Are Needed: Therapy and Medication Management (The patient notes, taking a sleep aid and depression medication). Initial Clinical Notes/Concerns: The patient is currently seeking treatment for  Depression.  Mental Health Symptoms Depression:  Depression: Change in energy/activity, Difficulty Concentrating, Fatigue, Hopelessness, Increase/decrease in appetite, Irritability, Sleep (too much or little), Weight gain/loss, Worthlessness  Mania:  Mania: N/A  Anxiety:   Anxiety: Difficulty concentrating, Fatigue, Irritability, Restlessness, Sleep, Tension, Worrying  Psychosis:  Psychosis: N/A  Trauma:  Trauma: N/A  Obsessions:  Obsessions: Attempts to suppress/neutralize, Cause anxiety, Disrupts routine/functioning, Intrusive/time consuming, Recurrent & persistent thoughts/impulses/images, Good insight  Compulsions:  Compulsions: "Driven" to perform behaviors/acts, Good insight, Intrusive/time consuming, Repeated behaviors/mental acts, Intended to reduce stress or prevent another outcome  Inattention:  Inattention: N/A, Disorganized, Fails to pay attention/makes careless mistakes, Forgetful, Loses things, Poor follow-through on tasks  Hyperactivity/Impulsivity:  Hyperactivity/Impulsivity: N/A, Fidgets with hands/feet, Feeling of restlessness, Difficulty waiting turn, Always on the go, Several symptoms present in 2 of more settings  Oppositional/Defiant Behaviors:  Oppositional/Defiant Behaviors: N/A  Borderline Personality:  Emotional Irregularity: N/A  Other Mood/Personality Symptoms:  Other Mood/Personality Symtpoms: No Additional   Mental Status Exam Appearance and self-care  Stature:  Stature: Average  Weight:  Weight: Overweight  Clothing:  Clothing: Casual  Grooming:  Grooming: Neglected  Cosmetic use:  Cosmetic Use: None  Posture/gait:  Posture/Gait: Normal  Motor activity:  Motor Activity: Not Remarkable  Sensorium  Attention:  Attention: Normal  Concentration:  Concentration: Anxiety interferes  Orientation:  Orientation: X5  Recall/memory:  Recall/Memory: Defective in short-term  Affect and Mood  Affect:  Affect: Depressed  Mood:  Mood: Depressed  Relating  Eye contact:   Eye Contact: Normal  Facial expression:  Facial Expression: Depressed  Attitude toward examiner:  Attitude Toward Examiner: Cooperative  Thought and Language  Speech flow: Speech Flow: Normal  Thought content:  Thought Content: Appropriate to mood and circumstances  Preoccupation:  Preoccupations: Other (Comment)(The patient notes OCD patterns such as getting dressed going through the same doors the same way packing his bag and unpacking it in a ritualistic manner)  Hallucinations:  Hallucinations: Other (Comment)(None noted)  Organization:   Landscape architect of Knowledge:  Fund of Knowledge: Average  Intelligence:  Intelligence: Average  Abstraction:  Abstraction: Normal  Judgement:  Judgement: Normal  Reality Testing:  Reality Testing: Realistic  Insight:  Insight: Good  Decision Making:  Decision Making: Normal  Social Functioning  Social Maturity:  Social Maturity: Responsible  Social Judgement:  Social Judgement: Normal  Stress  Stressors:  Stressors: Grief/losses, Illness(Bestfriends Father Died in [place] recently. Degenerative Disc Disorder with Back injections frequently.)  Coping Ability:  Coping Ability: Normal  Skill Deficits:   None noted   Supports:   family    Family and Psychosocial History: Family history Marital status: Married Number of Years Married: 5 What types of issues is patient dealing with in the relationship?: The patient notes no marriage issues Additional relationship information: No Additional Are you sexually active?: Yes What is your sexual orientation?: Heterosexual Has your sexual activity been affected by drugs, alcohol, medication, or emotional stress?: No Does patient have children?: Yes How many children?: 5 How is patient's relationship with their children?: The patient notes 3 from 1st marriage and 2 from second marriage. The patient notes, " My oldest is in the [employer], when we talk we have good conversation.  My other children we talk and get along well and i have 50 month old twins".  Childhood History:  Childhood History By whom was/is the patient raised?: Both parents Additional childhood history information: No additonal Description of patient's relationship with caregiver when they were a child: The patient notes, " I had a wonderful relationship with my parents as a child". Patient's description of current relationship with people who raised him/her: The patient notes, " My Father is Deceased, but i get along well with my Mother". How were you disciplined when you got in trouble as a child/adolescent?: The patient notes, " I didnt get in trouble alot as a child/teen but when i did i just received a talking to". Does patient have siblings?: Yes Number of Siblings: 1 Description of patient's current relationship with siblings: The patient notes having 1 older sister age 67. The patient notes, " I have a good relationship with my sister, we dont talk often but we have family outtings together". 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, arts How long has patient been employed?: 73yrs Patient's job has been impacted by current illness: No What is the longest time patient has a held a job?: 46yrs Where was the patient employed at that time?: [place] [employer] Did You Receive Any Psychiatric Treatment/Services While in the [employer]?: No Are There Guns or Other Weapons in [place]?: No Are  These Psychologist, educational?: (NA)  Education: Education School Currently Attending: No Last Grade Completed: 12 Name of High  School: [employer] Did You Graduate From [employer]?: Yes Did You Attend College?: Yes What Type of College Degree Do you Have?: BA Science from [employer] Did [place] Attend Graduate School?: No What Was Your Major?: Education Did You Have Any Special Interests In School?: Education Did You Have An Individualized Education Program (IIEP): No Did You Have Any Difficulty At School?: No  Religion: Religion/Spirituality Are You A Religious Person?: No How Might This Affect Treatment?: NA  Leisure/Recreation: Leisure / Recreation Leisure and Hobbies: Research scientist (medical)  Exercise/Diet: Exercise/Diet Do You Exercise?: No Have You Gained or Lost A Significant Amount of Weight in the Past Six Months?: No Do You Follow a Special Diet?: No Do You Have Any Trouble Sleeping?: Yes Explanation of Sleeping Difficulties: The patient notes when he does not have his sleep aid he has difficulty falling alseep as well as staying asleep  CCA Part Two C  Alcohol/Drug Use: Alcohol / Drug Use Pain Medications: See patient chart Prescriptions: See patient chart Over the Counter: See patient chart History of alcohol / drug use?: No history of alcohol / drug abuse Longest period of sobriety (when/how long): NA                      CCA Part Three  ASAM's:  Six Dimensions of Multidimensional Assessment  Dimension 1:  Acute Intoxication and/or Withdrawal Potential:     Dimension 2:  Biomedical Conditions and Complications:     Dimension 3:  Emotional, Behavioral, or Cognitive Conditions and Complications:     Dimension 4:  Readiness to Change:     Dimension 5:  Relapse, Continued use, or Continued Problem Potential:     Dimension 6:  Recovery/Living Environment:      Substance use Disorder (SUD)    Social Function:  Social  Functioning Social Maturity: Responsible Social Judgement: Normal  Stress:  Stress Stressors: Grief/losses, Illness(Bestfriends Father Died in [place] recently. Degenerative Disc Disorder with Back injections frequently.) Coping Ability: Normal Patient Takes Medications The Way The Doctor Instructed?: Yes Priority Risk: Low Acuity  Risk Assessment- Self-Harm Potential: Risk Assessment For Self-Harm Potential Thoughts of Self-Harm: No current thoughts Method: No plan Availability of Means: No access/NA Additional Comments for Self-Harm Potential: The patient notes no current S/I  Risk Assessment -Dangerous to Others Potential: Risk Assessment For Dangerous to Others Potential Method: No Plan Availability of Means: No access or NA Intent: Vague intent or NA Notification Required: No need or identified person Additional Comments for Danger to Others Potential: The patient notes no current H/I  DSM5 Diagnoses: Patient Active Problem List   Diagnosis Date Noted  . MDD (major depressive disorder), recurrent, in full remission (HCC) 10/08/2019  . Drug reaction 10/08/2019  . Attention deficit hyperactivity disorder (ADHD), predominantly inattentive type 04/30/2019  . Cholecystitis   . Hypertension 07/05/2017  . Insomnia due to mental condition 07/05/2017  . MDD (major depressive disorder), recurrent episode, moderate (HCC) 01/13/2016  . 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 Anxiety and depression 12/01/2015    Patient Centered Plan: Patient is on the following Treatment Plan(s): Depression with Anxiety/ OCD / ADHD Recommendations for Services/Supports/Treatments: Recommendations for Services/Supports/Treatments Recommendations For Services/Supports/Treatments: Individual Therapy, Medication Management  Treatment Plan Summary: OP Treatment Plan Summary: The patient will work with the OPT therapist to reduce/eliminate the symptoms of his  mental health diagnosis as measured by the patient having no more than 2 episodes per week, as  evidenced by the patient report.  Referrals to Alternative Service(s): Referred to Alternative Service(s):   Place:   Date:   Time:    Referred to Alternative Service(s):   Place:   Date:   Time:    Referred to Alternative Service(s):   Place:   Date:   Time:    Referred to Alternative Service(s):   Place:   Date:   Time:     I discussed the assessment and treatment plan with the patient. The patient was provided an opportunity to ask questions and all were answered. The patient agreed with the plan and demonstrated an understanding of the instructions.   The patient was advised to call back or seek an in-person evaluation if the symptoms worsen or if the condition fails to improve as anticipated.  I provided 60 minutes of non-face-to-face time during this encounter.  Winfred Burn , LCSW

## 2019-12-28 ENCOUNTER — Ambulatory Visit: Payer: BC Managed Care – PPO | Attending: Internal Medicine

## 2019-12-28 DIAGNOSIS — Z23 Encounter for immunization: Secondary | ICD-10-CM

## 2019-12-28 NOTE — Progress Notes (Signed)
   Covid-19 Vaccination Clinic  Name:  Jeremy Boyer    MRN: [id number] DOB: 11-27-1970  12/28/2019  [name] was observed post Covid-19 immunization for 15 minutes without incident. He was provided with Vaccine Information Sheet and instruction to access the V-Safe system.   [name] was instructed to call 911 with any severe reactions post vaccine: Marland Kitchen Difficulty breathing  . Swelling of face and throat  . A fast heartbeat  . A bad rash all over body  . Dizziness and weakness   Immunizations Administered    Name Date Dose VIS Date Route   Moderna COVID-19 Vaccine 12/28/2019 10:46 AM 0.5 mL 09/03/2019 Intramuscular   Manufacturer: Gala Murdoch   Lot: [id number]   NDC: 56812-751-70

## 2020-01-05 ENCOUNTER — Other Ambulatory Visit: Payer: Self-pay | Admitting: Psychiatry

## 2020-01-05 DIAGNOSIS — F331 Major depressive disorder, recurrent, moderate: Secondary | ICD-10-CM

## 2020-01-06 ENCOUNTER — Encounter: Payer: Self-pay | Admitting: Psychiatry

## 2020-01-06 ENCOUNTER — Other Ambulatory Visit: Payer: Self-pay

## 2020-01-06 ENCOUNTER — Ambulatory Visit (INDEPENDENT_AMBULATORY_CARE_PROVIDER_SITE_OTHER): Payer: BC Managed Care – PPO | Admitting: Psychiatry

## 2020-01-06 DIAGNOSIS — F5105 Insomnia due to other mental disorder: Secondary | ICD-10-CM | POA: Diagnosis not present

## 2020-01-06 DIAGNOSIS — F429 Obsessive-compulsive disorder, unspecified: Secondary | ICD-10-CM

## 2020-01-06 DIAGNOSIS — F331 Major depressive disorder, recurrent, moderate: Secondary | ICD-10-CM | POA: Diagnosis not present

## 2020-01-06 DIAGNOSIS — F900 Attention-deficit hyperactivity disorder, predominantly inattentive type: Secondary | ICD-10-CM | POA: Diagnosis not present

## 2020-01-06 DIAGNOSIS — F902 Attention-deficit hyperactivity disorder, combined type: Secondary | ICD-10-CM | POA: Insufficient documentation

## 2020-01-06 MED ORDER — CLOMIPRAMINE HCL 25 MG PO CAPS: 25.0000 mg | ORAL_CAPSULE | Freq: Every day | ORAL | 0 refills | Status: DC

## 2020-01-06 MED ORDER — BUSPIRONE HCL 7.5 MG PO TABS: 7.5000 mg | ORAL_TABLET | Freq: Two times a day (BID) | ORAL | 1 refills | Status: DC

## 2020-01-06 MED ORDER — METHYLPHENIDATE HCL ER (OSM) 18 MG PO TBCR: 18.0000 mg | EXTENDED_RELEASE_TABLET | Freq: Every morning | ORAL | 0 refills | Status: DC

## 2020-01-06 NOTE — Progress Notes (Signed)
Provider Location : [facility] Patient Location : Home  Virtual Visit via Video Note  I connected with Jeremy Boyer on 01/06/20 at  4:00 PM EDT by a video enabled telemedicine application and verified that I am speaking with the correct person using two identifiers.   I discussed the limitations of evaluation and management by telemedicine and the availability of in person appointments. The patient expressed understanding and agreed to proceed.    I discussed the assessment and treatment plan with the patient. The patient was provided an opportunity to ask questions and all were answered. The patient agreed with the plan and demonstrated an understanding of the instructions.   The patient was advised to call back or seek an in-person evaluation if the symptoms worsen or if the condition fails to improve as anticipated.   [facility] MD OP Progress Note  01/06/2020 5:49 PM Jeremy Boyer  MRN:  [id number]  Chief Complaint:  Chief Complaint    Follow-up     HPI: Jeremy Boyer is a 49 year old Caucasian male, married, employed, lives in [place], has a history of MDD, OCD, ADHD, hypertension was evaluated by telemedicine today.  Patient today reports he is currently struggling with anxiety and depressive symptoms symptoms.  He reports kids are back in school for her in person learning.  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  He reports this has made his anxiety symptoms to get worse.  He reports sleep continues to be good.  Patient denies any suicidality, homicidality or perceptual disturbances.  He had an appointment with Mr. Doree Albee.  He is motivated to keep his appointment and continue CBT.  Patient denies any other concerns today. Visit Diagnosis:    ICD-10-CM   1. MDD (major depressive disorder), recurrent episode, moderate (HCC)  F33.1  busPIRone (BUSPAR) 7.5 MG tablet    clomiPRAMINE (ANAFRANIL) 25 MG capsule  2. 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 Attention deficit hyperactivity disorder (ADHD), predominantly inattentive type  F90.0 methylphenidate 18 MG PO CR tablet  4. Insomnia due to mental condition  F51.05     Past Psychiatric History: I have reviewed past psychiatric history from my progress note on 10/23/2018.  Past trials of Paxil, Abilify, Trintellix, Depakote, mirtazapine, risperidone, duloxetine  Past Medical History:  Past Medical History:  Diagnosis Date  . Anxiety and depression 12/01/2015  . Bipolar disorder (HCC)   . Cholecystitis   . Hypertension 07/05/2017  . Insomnia 07/05/2017  . MDD (major depressive disorder), recurrent episode, moderate (HCC) 01/13/2016  . OCD (obsessive compulsive disorder) 01/13/2016    Past Surgical History:  Procedure Laterality Date  . CHOLECYSTECTOMY N/A 07/10/2017   Procedure: LAPAROSCOPIC CHOLECYSTECTOMY;  Surgeon: Ancil Linsey, MD;  Location: [facility] ORS;  Service: General;  Laterality: N/A;  . VASECTOMY    . VASECTOMY REVERSAL      Family Psychiatric History: I have reviewed family psychiatric history from my progress note on 10/23/2018  Family History:  Family History  Problem Relation Age of Onset  . Anxiety disorder Mother   . Depression Mother   . Alcohol abuse Father   . Anxiety disorder Sister   . Depression Sister     Social History: Reviewed social history from my progress note on 10/23/2018 Social  History   Socioeconomic History  . Marital status: Married    Spouse name: nancy  . Number of children: 3  . Years of education: Not on file  . Highest education level: Bachelor's degree (e.g., BA, AB, BS)  Occupational History  . Not on file  Tobacco Use  . Smoking status: Never Smoker  . Smokeless tobacco: Never Used  Substance and Sexual  Activity  . Alcohol use: Yes    Alcohol/week: 23.0 standard drinks    Types: 2 Glasses of wine, 14 Cans of beer, 7 Shots of liquor per week  . Drug use: No  . Sexual activity: Yes    Partners: Female    Birth control/protection: None  Other Topics Concern  . Not on file  Social History Narrative  . Not on file   Social Determinants of Health   Financial Resource Strain:   . Difficulty of Paying Living Expenses:   Food Insecurity:   . Worried About Charity fundraiser in the Last Year:   . Arboriculturist in the Last Year:   Transportation Needs:   . Film/video editor (Medical):   Marland Kitchen Lack of Transportation (Non-Medical):   Physical Activity:   . Days of Exercise per Week:   . Minutes of Exercise per Session:   Stress:   . Feeling of Stress :   Social Connections:   . Frequency of Communication with Friends and Family:   . Frequency of Social Gatherings with Friends and Family:   . Attends Religious Services:   . Active Member of Clubs or Organizations:   . Attends Archivist Meetings:   Marland Kitchen Marital Status:     Allergies: No Known Allergies  Metabolic Disorder Labs: No results found for: HGBA1C, MPG No results found for: PROLACTIN No results found for: CHOL, TRIG, HDL, CHOLHDL, VLDL, LDLCALC Lab Results  Component Value Date   TSH 3.980 10/23/2018    Therapeutic Level Labs: No results found for: LITHIUM No results found for: VALPROATE No components found for:  CBMZ  Current Medications: Current Outpatient Medications  Medication Sig Dispense Refill  . bisoprolol-hydrochlorothiazide (ZIAC) 5-6.25 MG tablet Take by mouth.    . ALPRAZolam (XANAX) 1 MG tablet TAKE 1 TABLET BY MOUTH EVERY DAY AS NEEDED    . amLODipine (NORVASC) 10 MG tablet Take by mouth.    . bisoprolol-hydrochlorothiazide (ZIAC) 5-6.25 MG tablet Take 1 tablet by mouth daily.    Marland Kitchen buPROPion (WELLBUTRIN SR) 150 MG 12 hr tablet Take 1 tablet (150 mg total) by mouth 2 (two) times daily.  60 tablet 1  . busPIRone (BUSPAR) 7.5 MG tablet Take 1 tablet (7.5 mg total) by mouth 2 (two) times daily. 60 tablet 1  . clomiPRAMINE (ANAFRANIL) 25 MG capsule Take 1 capsule (25 mg total) by mouth at bedtime. 90 capsule 0  . cyclobenzaprine (FLEXERIL) 10 MG tablet Take 10 mg by mouth 3 (three) times daily as needed for muscle spasms.     . methylphenidate 18 MG PO CR tablet Take 1 tablet (18 mg total) by mouth daily. 30 tablet 0  . [START ON 01/15/2020] methylphenidate 18 MG PO CR tablet Take 1 tablet (18 mg total) by mouth every morning. 30 tablet 0  . rosuvastatin (CRESTOR) 10 MG tablet Take 10 mg by mouth at bedtime.    . rosuvastatin (CRESTOR) 10 MG tablet Take by mouth.    . valACYclovir (VALTREX) 500 MG tablet TAKE 1 TABLET BY MOUTH ONCE DAILY    .  valsartan (DIOVAN) 320 MG tablet Take 1 tablet by mouth daily.  11  . zolpidem (AMBIEN CR) 12.5 MG CR tablet TAKE 1 TABLET(12.5 MG) BY MOUTH AT BEDTIME AS NEEDED FOR SLEEP 30 tablet 3   No current facility-administered medications for this visit.     Musculoskeletal: Strength & Muscle Tone: UTA Gait & Station: normal Patient leans: N/A  Psychiatric Specialty Exam: Review of Systems  Psychiatric/Behavioral: Positive for dysphoric mood. Negative for agitation, behavioral problems, confusion, decreased concentration, hallucinations, sleep disturbance and suicidal ideas. The patient is nervous/anxious. The patient is not hyperactive.   All other systems reviewed and are negative.   There were no vitals taken for this visit.There is no height or weight on file to calculate BMI.  General Appearance: Casual  Eye Contact:  Fair  Speech:  Clear and Coherent  Volume:  Normal  Mood:  Anxious and Dysphoric  Affect:  Congruent  Thought Process:  Goal Directed and Descriptions of Associations: Intact  Orientation:  Full (Time, Place, and Person)  Thought Content: Logical   Suicidal Thoughts:  No  Homicidal Thoughts:  No  Memory:  Immediate;    Fair Recent;   Fair Remote;   Fair  Judgement:  Fair  Insight:  Fair  Psychomotor Activity:  Normal  Concentration:  Concentration: Fair and Attention Span: Fair  Recall:  [employer] of Knowledge: Fair  Language: Fair  Akathisia:  No  Handed:  Right  AIMS (if indicated): UTA  Assets:  Communication Skills Desire for Improvement Housing Social Support  ADL's:  Intact  Cognition: WNL  Sleep:  Fair   Screenings:   Assessment and Plan: Stepen is a 65 year old Caucasian male, married, employed, lives in [place], has a history of OCD, depression, ADHD, insomnia was evaluated by telemedicine today.  Patient currently is struggling with anxiety symptoms due to significant psychosocial stressors of the pandemic, school reopening and kids returning back for in person learning.  Patient will continue to benefit from medication readjustment and psychotherapy sessions.  Plan MDD-unstable Clomipramine 25 mg p.o. nightly-reduced dosage Wellbutrin SR 150 mg p.o. twice daily Add BuSpar 7.5 mg p.o. twice daily.  Discussed with patient to increase the dosage to 15 mg twice a day if he tolerates it in 2 weeks. Continue psychotherapy sessions with Mr. Montez Morita.  OCD-stable Clomipramine as prescribed Continue CBT  ADHD-stable Concerta 18 mg p.o. daily I have reviewed [place] controlled substance database. EKG pending.  Insomnia-stable Ambien CR 12.5 mg p.o. nightly  Follow-up in clinic in 4 weeks or sooner if needed.  I have spent atleast 20 minutes non face to face with patient today. More than 50 % of the time was spent for preparing to see the patient ( e.g., review of test, records ),  ordering medications and test ,psychoeducation and supportive psychotherapy and care coordination,as well as documenting clinical information in electronic health record. This note was generated in part or whole with voice recognition software. Voice recognition is usually quite accurate but there are  transcription errors that can and very often do occur. I apologize for any typographical errors that were not detected and corrected.      Jomarie Longs, MD 01/06/2020, 5:49 PM

## 2020-02-03 ENCOUNTER — Other Ambulatory Visit: Payer: Self-pay | Admitting: Psychiatry

## 2020-02-03 DIAGNOSIS — F331 Major depressive disorder, recurrent, moderate: Secondary | ICD-10-CM

## 2020-02-05 ENCOUNTER — Other Ambulatory Visit: Payer: Self-pay

## 2020-02-05 ENCOUNTER — Encounter: Payer: Self-pay | Admitting: Psychiatry

## 2020-02-05 ENCOUNTER — Telehealth (INDEPENDENT_AMBULATORY_CARE_PROVIDER_SITE_OTHER): Payer: BC Managed Care – PPO | Admitting: Psychiatry

## 2020-02-05 DIAGNOSIS — F900 Attention-deficit hyperactivity disorder, predominantly inattentive type: Secondary | ICD-10-CM

## 2020-02-05 DIAGNOSIS — F429 Obsessive-compulsive disorder, unspecified: Secondary | ICD-10-CM

## 2020-02-05 DIAGNOSIS — F331 Major depressive disorder, recurrent, moderate: Secondary | ICD-10-CM | POA: Diagnosis not present

## 2020-02-05 DIAGNOSIS — F5105 Insomnia due to other mental disorder: Secondary | ICD-10-CM

## 2020-02-05 MED ORDER — BREXPIPRAZOLE 0.25 MG PO TABS: 0.2500 mg | ORAL_TABLET | Freq: Every day | ORAL | 1 refills | Status: DC

## 2020-02-05 NOTE — Progress Notes (Signed)
Provider Location : [facility] Patient Location : Home  Virtual Visit via Video Note  I connected with Jeremy Boyer on 02/05/20 at  3:30 PM EDT by a video enabled telemedicine application and verified that I am speaking with the correct person using two identifiers.   I discussed the limitations of evaluation and management by telemedicine and the availability of in person appointments. The patient expressed understanding and agreed to proceed.    I discussed the assessment and treatment plan with the patient. The patient was provided an opportunity to ask questions and all were answered. The patient agreed with the plan and demonstrated an understanding of the instructions.   The patient was advised to call back or seek an in-person evaluation if the symptoms worsen or if the condition fails to improve as anticipated.   [facility] MD OP Progress Note  02/05/2020 5:17 PM Jeremy Boyer  MRN:  [id number]  Chief Complaint:  Chief Complaint    Follow-up     HPI: Jeremy Boyer is a 49 year old Caucasian male, married, employed, lives in [place], has a history of MDD, OCD, ADHD, hypertension was evaluated by telemedicine today.  Patient today reports he is currently struggling with depressive symptoms.  This started a few weeks ago.  He reports he feels sad, lack of motivation, low energy and so on.  He is compliant on his medications as prescribed.  Patient reports sleep is good on the Ambien.  He denies any suicidality, homicidality or perceptual disturbances.  Patient reports he does have upcoming appointment with his therapist.  He is motivated to keep his appointment.  His last appointment was sometime in mid March.  Patient reports Concerta is helpful with his focus and concentration.  Patient reports he was recently started on bisoprolol-hydrochlorothiazide for hypertension.  He reports his blood pressure is currently more under control.  Patient denies any other concerns  today. Visit Diagnosis:    ICD-10-CM   1. MDD (major depressive disorder), recurrent episode, moderate (HCC)  F33.1 brexpiprazole (REXULTI) TABS tablet  2. 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 Attention deficit hyperactivity disorder (ADHD), predominantly inattentive type  F90.0   4. Insomnia due to mental condition  F51.05     Past Psychiatric History: I have reviewed past psychiatric history from my progress note on 10/23/2018.  Past trials of Paxil, Abilify, Trintellix, Depakote, mirtazapine, risperidone, duloxetine.  Past Medical History:  Past Medical History:  Diagnosis Date  . Anxiety and depression 12/01/2015  . Bipolar disorder ([place])   . Cholecystitis   . Hypertension 07/05/2017  . Insomnia 07/05/2017  . MDD (major depressive disorder), recurrent episode, moderate ([place]) 01/13/2016  . OCD (obsessive compulsive disorder) 01/13/2016    Past Surgical History:  Procedure Laterality Date  . CHOLECYSTECTOMY N/A 07/10/2017   Procedure: LAPAROSCOPIC CHOLECYSTECTOMY;  Surgeon: Vickie Epley, MD;  Location: [facility] ORS;  Service: General;  Laterality: N/A;  . VASECTOMY    . VASECTOMY REVERSAL      Family Psychiatric History: I have reviewed family psychiatric history from my progress note on 10/23/2018  Family History:  Family History  Problem Relation Age of Onset  . Anxiety disorder Mother   . Depression Mother   . Alcohol abuse Father   . Anxiety disorder Sister   . Depression Sister     Social History: I have reviewed social history from my progress note on 10/23/2018 Social History   Socioeconomic History  . Marital status: Married  Spouse name: nancy  . Number of children: 3  . Years of education: Not on file  . Highest education level: Bachelor's degree (e.g., BA, AB, BS)  Occupational History  . Not on file  Tobacco Use  . Smoking status: Never Smoker  . Smokeless tobacco: Never Used  Substance  and Sexual Activity  . Alcohol use: Yes    Alcohol/week: 23.0 standard drinks    Types: 2 Glasses of wine, 14 Cans of beer, 7 Shots of liquor per week  . Drug use: No  . Sexual activity: Yes    Partners: Female    Birth control/protection: None  Other Topics Concern  . Not on file  Social History Narrative  . Not on file   Social Determinants of Health   Financial Resource Strain:   . Difficulty of Paying Living Expenses:   Food Insecurity:   . Worried About Programme researcher, broadcasting/film/video in the Last Year:   . Barista in the Last Year:   Transportation Needs:   . Freight forwarder (Medical):   Marland Kitchen Lack of Transportation (Non-Medical):   Physical Activity:   . Days of Exercise per Week:   . Minutes of Exercise per Session:   Stress:   . Feeling of Stress :   Social Connections:   . Frequency of Communication with Friends and Family:   . Frequency of Social Gatherings with Friends and Family:   . Attends Religious Services:   . Active Member of Clubs or Organizations:   . Attends Banker Meetings:   Marland Kitchen Marital Status:     Allergies: No Known Allergies  Metabolic Disorder Labs: No results found for: HGBA1C, MPG No results found for: PROLACTIN No results found for: CHOL, TRIG, HDL, CHOLHDL, VLDL, LDLCALC Lab Results  Component Value Date   TSH 3.980 10/23/2018    Therapeutic Level Labs: No results found for: LITHIUM No results found for: VALPROATE No components found for:  CBMZ  Current Medications: Current Outpatient Medications  Medication Sig Dispense Refill  . bisoprolol-hydrochlorothiazide (ZIAC) 10-6.25 MG tablet Take by mouth.    . ALPRAZolam (XANAX) 1 MG tablet TAKE 1 TABLET BY MOUTH EVERY DAY AS NEEDED    . bisoprolol-hydrochlorothiazide (ZIAC) 10-6.25 MG tablet Take 1 tablet by mouth daily.    . bisoprolol-hydrochlorothiazide (ZIAC) 5-6.25 MG tablet Take 1 tablet by mouth daily.    . bisoprolol-hydrochlorothiazide (ZIAC) 5-6.25 MG tablet  Take by mouth.    . brexpiprazole (REXULTI) TABS tablet Take 1-2 tablets (0.25-0.5 mg total) by mouth daily. Start taking 1 tablet at bedtime for a week and increase to 2 tablets 60 tablet 1  . buPROPion (WELLBUTRIN SR) 150 MG 12 hr tablet TAKE 1 TABLET(150 MG) BY MOUTH TWICE DAILY 60 tablet 1  . busPIRone (BUSPAR) 7.5 MG tablet Take 1 tablet (7.5 mg total) by mouth 2 (two) times daily. 60 tablet 1  . cyclobenzaprine (FLEXERIL) 10 MG tablet Take 10 mg by mouth 3 (three) times daily as needed for muscle spasms.     . methylphenidate 18 MG PO CR tablet Take 1 tablet (18 mg total) by mouth daily. 30 tablet 0  . methylphenidate 18 MG PO CR tablet Take 1 tablet (18 mg total) by mouth every morning. 30 tablet 0  . rosuvastatin (CRESTOR) 10 MG tablet Take 10 mg by mouth at bedtime.    . rosuvastatin (CRESTOR) 10 MG tablet Take by mouth.    . valACYclovir (VALTREX) 500 MG tablet TAKE  1 TABLET BY MOUTH ONCE DAILY    . valsartan (DIOVAN) 320 MG tablet Take 1 tablet by mouth daily.  11  . zolpidem (AMBIEN CR) 12.5 MG CR tablet TAKE 1 TABLET(12.5 MG) BY MOUTH AT BEDTIME AS NEEDED FOR SLEEP 30 tablet 3   No current facility-administered medications for this visit.     Musculoskeletal: Strength & Muscle Tone: UTA Gait & Station: normal Patient leans: N/A  Psychiatric Specialty Exam: Review of Systems  Psychiatric/Behavioral: Positive for dysphoric mood.  All other systems reviewed and are negative.   There were no vitals taken for this visit.There is no height or weight on file to calculate BMI.  General Appearance: Casual  Eye Contact:  Fair  Speech:  Clear and Coherent  Volume:  Normal  Mood:  Depressed  Affect:  Congruent  Thought Process:  Goal Directed and Descriptions of Associations: Intact  Orientation:  Full (Time, Place, and Person)  Thought Content: Logical   Suicidal Thoughts:  No  Homicidal Thoughts:  No  Memory:  Immediate;   Fair Recent;   Fair Remote;   Fair  Judgement:   Fair  Insight:  Fair  Psychomotor Activity:  Normal  Concentration:  Concentration: Fair and Attention Span: Fair  Recall:  [employer] of Knowledge: Fair  Language: Fair  Akathisia:  No  Handed:  Right  AIMS (if indicated): UTA  Assets:  Communication Skills Desire for Improvement Housing Social Support  ADL's:  Intact  Cognition: WNL  Sleep:  Fair   Screenings:   Assessment and Plan: Jeremy Boyer is a 49 year old Caucasian male, married, employed, lives in [place], has a history of MDD, OCD, ADHD, insomnia was evaluated by telemedicine today.  Patient is currently struggling with depressive symptoms.  Patient with psychosocial stressors of the pandemic, and job-related stressors.  Patient also had recent medication changes for his blood pressure ,although his blood pressure is currently stable on the new medication.  Discussed plan as noted below.  Plan MDD-unstable Discontinue clomipramine. Continue Wellbutrin SR 150 mg p.o. twice daily Continue BuSpar 7.5 mg p.o. twice daily. Start Rexulti 0.25 mg p.o. nightly, increase it to 0.5 mg after a week. Patient advised to be more compliant with his therapy sessions with Mr. Montez Morita.  OCD-stable Continue CBT Continue BuSpar.  ADHD-stable Concerta 18 mg p.o. daily He has 1 prescription pending at the pharmacy. I have reviewed [place] controlled substance database. EKG pending  Insomnia-stable Ambien CR 12.5 mg p.o. nightly  Follow-up in clinic in 3 to 4 weeks or sooner if needed.  I have spent atleast 20 minutes non face to face with patient today. More than 50 % of the time was spent for preparing to see the patient ( e.g., review of test, records ), obtaining and to review and separately obtained history , ordering medications and test ,psychoeducation and supportive psychotherapy and care coordination,as well as documenting clinical information in electronic health record. This note was generated in part or whole with voice  recognition software. Voice recognition is usually quite accurate but there are transcription errors that can and very often do occur. I apologize for any typographical errors that were not detected and corrected.       Jomarie Longs, MD 02/05/2020, 5:17 PM

## 2020-02-06 ENCOUNTER — Telehealth: Payer: Self-pay

## 2020-02-06 NOTE — Telephone Encounter (Signed)
received fax requesting a prior auth for rexulti

## 2020-02-06 NOTE — Telephone Encounter (Signed)
went online to [email or web address] and submitted the prior auth . - pending 

## 2020-02-17 ENCOUNTER — Other Ambulatory Visit: Payer: Self-pay

## 2020-02-17 ENCOUNTER — Ambulatory Visit (INDEPENDENT_AMBULATORY_CARE_PROVIDER_SITE_OTHER): Payer: BC Managed Care – PPO | Admitting: Clinical

## 2020-02-17 DIAGNOSIS — F331 Major depressive disorder, recurrent, moderate: Secondary | ICD-10-CM | POA: Diagnosis not present

## 2020-02-17 DIAGNOSIS — F902 Attention-deficit hyperactivity disorder, combined type: Secondary | ICD-10-CM | POA: Diagnosis not present

## 2020-02-17 DIAGNOSIS — F429 Obsessive-compulsive disorder, unspecified: Secondary | ICD-10-CM

## 2020-02-17 DIAGNOSIS — F419 Anxiety disorder, unspecified: Secondary | ICD-10-CM

## 2020-02-17 NOTE — Progress Notes (Signed)
Virtual Visit via Video Note  I connected with Jeremy Boyer on 02/17/20 at 11:00 AM EDT by a video enabled telemedicine application and verified that I am speaking with the correct person using two identifiers.  Location: Patient: Home Provider: Office    I discussed the limitations of evaluation and management by telemedicine and the availability of in person appointments. The patient expressed understanding and agreed to proceed.   THERAPIST PROGRESS NOTE  Session Time: 11:00AM-11:45AM  Participation Level: Active  Behavioral Response: CasualAlertDepressed  Type of Therapy: Individual Therapy  Treatment Goals addressed: Coping  Interventions: CBT, Motivational Interviewing, Solution Focused and Supportive  Summary: Jeremy Boyer is a 49 y.o. male who presents with Depression with Anxiety/OCD/ADHD. The OPT therapist worked with the patient for his initial session. The OPT therapist utilized Motivational Interviewing to assist in creating therapeutic repore. The patient in the session was engaged and work in collaboration giving feedback about his triggers and symptoms over the past few weeks including having a sinus cold and being under more pressure due to it being the end of the academic year. The OPT therapist utilized Cognitive Behavioral Therapy through cognitive restructuring as well as worked with the patient on coping strategies to assist in management of his mental health symptoms including incorporating his love of golf and yard work and incorporating peers more frequently. The OPT therapist inquired for holistic care about the patients adherence to medication therapy  Suicidal/Homicidal: Nowithout intent/plan  Therapist Response: The OPT therapist worked with the patient for the patients initial scheduled session. The patient was engaged in his session and gave feedback in relation to triggers, symptoms, and behavior responses over the past few weeks. The OPT  therapist worked with the patient utilizing an in session Cognitive Behavioral Therapy exercise. The patient was responsive in the session and verbalized, " I am going to focus on taking more time for me and I am going to call up some friends and see if we can go out to eat soon". The patient indicated both compliance and effectiveness in relation to his current medication therapy. The OPT therapist will continue treatment work with the patient in his next scheduled session.  Plan: Return again in 2/3 weeks.  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 The patient was provided an opportunity to ask questions and all were answered. The patient agreed with the plan and demonstrated an understanding of the instructions.   The patient was advised to call back or seek an in-person evaluation if the symptoms worsen or if the condition fails to improve as anticipated.  I provided 40 minutes of non-face-to-face time during this encounter.  Jeremy Burn, LCSW 02/17/2020

## 2020-03-08 ENCOUNTER — Other Ambulatory Visit: Payer: Self-pay | Admitting: Psychiatry

## 2020-03-08 DIAGNOSIS — F331 Major depressive disorder, recurrent, moderate: Secondary | ICD-10-CM

## 2020-03-08 DIAGNOSIS — F429 Obsessive-compulsive disorder, unspecified: Secondary | ICD-10-CM

## 2020-03-09 ENCOUNTER — Ambulatory Visit (INDEPENDENT_AMBULATORY_CARE_PROVIDER_SITE_OTHER): Payer: BC Managed Care – PPO | Admitting: Clinical

## 2020-03-09 ENCOUNTER — Encounter: Payer: Self-pay | Admitting: Psychiatry

## 2020-03-09 ENCOUNTER — Other Ambulatory Visit: Payer: Self-pay

## 2020-03-09 ENCOUNTER — Telehealth (INDEPENDENT_AMBULATORY_CARE_PROVIDER_SITE_OTHER): Payer: BC Managed Care – PPO | Admitting: Psychiatry

## 2020-03-09 DIAGNOSIS — Z79899 Other long term (current) drug therapy: Secondary | ICD-10-CM | POA: Insufficient documentation

## 2020-03-09 DIAGNOSIS — F331 Major depressive disorder, recurrent, moderate: Secondary | ICD-10-CM

## 2020-03-09 DIAGNOSIS — Z9189 Other specified personal risk factors, not elsewhere classified: Secondary | ICD-10-CM

## 2020-03-09 DIAGNOSIS — F900 Attention-deficit hyperactivity disorder, predominantly inattentive type: Secondary | ICD-10-CM

## 2020-03-09 DIAGNOSIS — F429 Obsessive-compulsive disorder, unspecified: Secondary | ICD-10-CM

## 2020-03-09 DIAGNOSIS — F902 Attention-deficit hyperactivity disorder, combined type: Secondary | ICD-10-CM

## 2020-03-09 DIAGNOSIS — F419 Anxiety disorder, unspecified: Secondary | ICD-10-CM

## 2020-03-09 DIAGNOSIS — F5105 Insomnia due to other mental disorder: Secondary | ICD-10-CM

## 2020-03-09 MED ORDER — BUPROPION HCL ER (SR) 150 MG PO TB12: ORAL_TABLET | ORAL | 1 refills | Status: DC

## 2020-03-09 MED ORDER — METHYLPHENIDATE HCL ER (OSM) 18 MG PO TBCR: 18.0000 mg | EXTENDED_RELEASE_TABLET | Freq: Every morning | ORAL | 0 refills | Status: DC

## 2020-03-09 MED ORDER — METHYLPHENIDATE HCL ER 18 MG PO TB24: 18.0000 mg | ORAL_TABLET | Freq: Every day | ORAL | 0 refills | Status: DC

## 2020-03-09 NOTE — Progress Notes (Signed)
Provider Location : [facility] Patient Location : Home  Virtual Visit via Video Note  I connected with Jeremy Boyer on 03/09/20 at  4:30 PM EDT by a video enabled telemedicine application and verified that I am speaking with the correct person using two identifiers.   I discussed the limitations of evaluation and management by telemedicine and the availability of in person appointments. The patient expressed understanding and agreed to proceed.   I discussed the assessment and treatment plan with the patient. The patient was provided an opportunity to ask questions and all were answered. The patient agreed with the plan and demonstrated an understanding of the instructions.   The patient was advised to call back or seek an in-person evaluation if the symptoms worsen or if the condition fails to improve as anticipated.  BH MD OP Progress Note  03/09/2020 8:36 PM Kaye Mitro  MRN:  [id number]  Chief Complaint:  Chief Complaint    Follow-up     HPI: Jeremy Boyer is a 49 year old Caucasian male, married, employed, lives in [place], has a history of MDD, OCD, ADHD, hypertension was evaluated by telemedicine today.  Patient today reports he is currently struggling with depressive symptoms.  He struggles with low mood, lack of motivation, low energy, since the past several weeks and getting worse.  Patient reports he has been following up with his therapist Mr. Suzan Garibaldi and has been working on techniques which helps to some extent.  He does have upcoming appointments with his therapist.  Patient today reports that he has been unable to fill his Rexulti since his health insurance plan will not approve it.   He reports he is taking the Concerta as prescribed and that does help him with his concentration and focus.  Patient denies any suicidality, homicidality or perceptual disturbances.  He reports he is currently on a break from work since at this academic school year  is over and he looks forward to spending time with his family.  Patient denies any other concerns today.  Visit Diagnosis:    ICD-10-CM   1. MDD (major depressive disorder), recurrent episode, moderate (HCC)  F33.1 buPROPion (WELLBUTRIN SR) 150 MG 12 hr tablet  2. 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 Attention deficit hyperactivity disorder (ADHD), predominantly inattentive type  F90.0 methylphenidate 18 MG PO CR tablet    methylphenidate 18 MG PO CR tablet  4. Insomnia due to mental condition  F51.05   5. At risk for prolonged QT interval syndrome  Z91.89 EKG 12-Lead    Past Psychiatric History: I have reviewed past psychiatric history from my progress note on 10/23/2018.  Past trials of Paxil, Abilify, Trintellix, Depakote, mirtazapine, risperidone, duloxetine, Seroquel.  Past Medical History:  Past Medical History:  Diagnosis Date  . Anxiety and depression 12/01/2015  . Bipolar disorder (HCC)   . Cholecystitis   . Hypertension 07/05/2017  . Insomnia 07/05/2017  . MDD (major depressive disorder), recurrent episode, moderate (HCC) 01/13/2016  . OCD (obsessive compulsive disorder) 01/13/2016    Past Surgical History:  Procedure Laterality Date  . CHOLECYSTECTOMY N/A 07/10/2017   Procedure: LAPAROSCOPIC CHOLECYSTECTOMY;  Surgeon: Ancil Linsey, MD;  Location: [facility] ORS;  Service: General;  Laterality: N/A;  . VASECTOMY    . VASECTOMY REVERSAL      Family Psychiatric History: I have reviewed family psychiatric history from my progress note on 10/23/2018  Family History:  Family History  Problem Relation Age of Onset  .  Anxiety disorder Mother   . Depression Mother   . Alcohol abuse Father   . Anxiety disorder Sister   . Depression Sister     Social History: I have reviewed social history from my progress note on 10/23/2018 Social History   Socioeconomic History  . Marital status: Married    Spouse name: nancy  . Number of children: 3  .  Years of education: Not on file  . Highest education level: Bachelor's degree (e.g., BA, AB, BS)  Occupational History  . Not on file  Tobacco Use  . Smoking status: Never Smoker  . Smokeless tobacco: Never Used  Substance and Sexual Activity  . Alcohol use: Yes    Alcohol/week: 23.0 standard drinks    Types: 2 Glasses of wine, 14 Cans of beer, 7 Shots of liquor per week  . Drug use: No  . Sexual activity: Yes    Partners: Female    Birth control/protection: None  Other Topics Concern  . Not on file  Social History Narrative  . Not on file   Social Determinants of Health   Financial Resource Strain:   . Difficulty of Paying Living Expenses:   Food Insecurity:   . Worried About Programme researcher, broadcasting/film/video in the Last Year:   . Barista in the Last Year:   Transportation Needs:   . Freight forwarder (Medical):   Marland Kitchen Lack of Transportation (Non-Medical):   Physical Activity:   . Days of Exercise per Week:   . Minutes of Exercise per Session:   Stress:   . Feeling of Stress :   Social Connections:   . Frequency of Communication with Friends and Family:   . Frequency of Social Gatherings with Friends and Family:   . Attends Religious Services:   . Active Member of Clubs or Organizations:   . Attends Banker Meetings:   Marland Kitchen Marital Status:     Allergies: No Known Allergies  Metabolic Disorder Labs: No results found for: HGBA1C, MPG No results found for: PROLACTIN No results found for: CHOL, TRIG, HDL, CHOLHDL, VLDL, LDLCALC Lab Results  Component Value Date   TSH 3.980 10/23/2018    Therapeutic Level Labs: No results found for: LITHIUM No results found for: VALPROATE No components found for:  CBMZ  Current Medications: Current Outpatient Medications  Medication Sig Dispense Refill  . ALPRAZolam (XANAX) 1 MG tablet TAKE 1 TABLET BY MOUTH EVERY DAY AS NEEDED    . bisoprolol-hydrochlorothiazide (ZIAC) 10-6.25 MG tablet Take 1 tablet by mouth daily.     Marland Kitchen buPROPion (WELLBUTRIN SR) 150 MG 12 hr tablet TAKE 1 TABLET(150 MG) BY MOUTH TWICE DAILY 60 tablet 1  . busPIRone (BUSPAR) 7.5 MG tablet TAKE 1 TABLET(7.5 MG) BY MOUTH TWICE DAILY 60 tablet 1  . cyclobenzaprine (FLEXERIL) 10 MG tablet Take 10 mg by mouth 3 (three) times daily as needed for muscle spasms.     Melene Muller ON 03/12/2020] methylphenidate 18 MG PO CR tablet Take 1 tablet (18 mg total) by mouth daily. 30 tablet 0  . [START ON 04/10/2020] methylphenidate 18 MG PO CR tablet Take 1 tablet (18 mg total) by mouth every morning. 30 tablet 0  . rosuvastatin (CRESTOR) 10 MG tablet Take 10 mg by mouth at bedtime.    . valACYclovir (VALTREX) 500 MG tablet TAKE 1 TABLET BY MOUTH ONCE DAILY    . zolpidem (AMBIEN CR) 12.5 MG CR tablet TAKE 1 TABLET(12.5 MG) BY MOUTH AT BEDTIME  AS NEEDED FOR SLEEP 30 tablet 3  . amoxicillin-clavulanate (AUGMENTIN) 875-125 MG tablet Take 1 tablet by mouth 2 (two) times daily.    . bisoprolol-hydrochlorothiazide (ZIAC) 10-6.25 MG tablet Take by mouth.     No current facility-administered medications for this visit.     Musculoskeletal: Strength & Muscle Tone: UTA Gait & Station: normal Patient leans: N/A  Psychiatric Specialty Exam: Review of Systems  Psychiatric/Behavioral: Positive for dysphoric mood.  All other systems reviewed and are negative.   There were no vitals taken for this visit.There is no height or weight on file to calculate BMI.  General Appearance: Casual  Eye Contact:  Fair  Speech:  Clear and Coherent  Volume:  Normal  Mood:  Depressed  Affect:  Congruent  Thought Process:  Goal Directed and Descriptions of Associations: Intact  Orientation:  Full (Time, Place, and Person)  Thought Content: Logical   Suicidal Thoughts:  No  Homicidal Thoughts:  No  Memory:  Immediate;   Fair Recent;   Fair Remote;   Fair  Judgement:  Fair  Insight:  Fair  Psychomotor Activity:  Normal  Concentration:  Concentration: Fair and Attention Span:  Fair  Recall:  [employer] of Knowledge: Fair  Language: Fair  Akathisia:  No  Handed:  Right  AIMS (if indicated): UTA  Assets:  Communication Skills Desire for Improvement Housing Social Support  ADL's:  Intact  Cognition: WNL  Sleep:  Fair   Screenings:   Assessment and Plan: Jeremy Boyer is a 49 year old Caucasian male, married, employed, lives in [place], has a history of MDD, OCD, ADHD, insomnia was evaluated by telemedicine today.  Patient is currently struggling with depressive symptoms.  He will benefit from the following medication readjustment as well as continued psychotherapy sessions.  Plan MDD-unstable Continue Wellbutrin SR 150 mg p.o. twice daily BuSpar 7.5 mg p.o. twice daily Discontinue Rexulti due to cost. Discussed starting Geodon 20 mg p.o. daily with supper.  However discussed with patient to get EKG prior to starting this medication.  OCD-stable Continue CBT Continue BuSpar  ADHD-stable Concerta 18 mg p.o. daily I have sent 2 prescriptions to the pharmacy with date specified. I have reviewed [place] controlled substance database.  Insomnia-stable Ambien CR 12.5 mg p.o. nightly  At risk for prolonged QT syndrome-will order EKG.  Follow-up in clinic in 4 to 6 weeks or sooner if needed.  However discussed with patient to call writer once he gets his EKG to add the Geodon.  I have spent atleast 20 minutes non face to face with patient today. More than 50 % of the time was spent for preparing to see the patient ( e.g., review of test, records ) , ordering medications and test ,psychoeducation and supportive psychotherapy and care coordination,as well as documenting clinical information in electronic health record. This note was generated in part or whole with voice recognition software. Voice recognition is usually quite accurate but there are transcription errors that can and very often do occur. I apologize for any typographical errors that were  not detected and corrected.       Ursula Alert, MD 03/09/2020, 8:36 PM

## 2020-03-09 NOTE — Progress Notes (Addendum)
  Virtual Visit via Telephone Note  I connected with Jeremy Boyer on 03/09/20 at  3:00 PM EDT by telephone and verified that I am speaking with the correct person using two identifiers.  Location: Patient: Home Provider: Office   I discussed the limitations of evaluation and management by telemedicine and the availability of in person appointments. The patient expressed understanding and agreed to proceed.   THERAPIST PROGRESS NOTE  Session Time:  3:00PM-3:40PM  Participation Level: Active  Behavioral Response: CasualAlertDepressed  Type of Therapy: Individual Therapy  Treatment Goals addressed: Coping  Interventions: CBT, Motivational Interviewing, Solution Focused and Supportive  Summary: Jeremy Boyer is a 49 y.o. male who presents with Depression with Anxiety/OCD/ADHD. The OPT therapist worked with the patient for his ongoing OPT treatment. The OPT therapist utilized Motivational Interviewing to assist in creating therapeutic repore. The patient in the session was engaged and work in collaboration giving feedback about his triggers and symptoms over the past few weeks. The OPT therapist utilized Cognitive Behavioral Therapy through cognitive restructuring as well as worked with the patient on coping strategies to assist in management of his mental health symptoms and the patient has been doing his therapy homework between his last session and current session by being more social and using purposeful distractions including golfing, going to lunch with friends, and spending more time with family. The patient identified that a large stressor the end of the academic year has been lifted. The OPT therapist inquired for holistic care about the patients adherence to medication therapy. The patient noted that the medication he was prescribed even after his insurance coverage was extremely high and he was not able to fill the medication. The patient has a medication  follow up with Dr. Elna Breslow today and was instructed to overview the situation with his prescriber.  Suicidal/Homicidal: Nowithout intent/plan  Therapist Response: The OPT therapist worked with the patient for the patients ongoing OPT treatments. The patient was engaged in his session and gave feedback in relation to triggers, symptoms, and behavior responses over the past few weeks. The OPT therapist worked with the patient utilizing an in session Cognitive Behavioral Therapy exercise. The patient was responsive in the session and verbalized, " I have implemented dad/daughter dates, golfing, and going to eat with friends and this has been very helpful when I am doing these things I am able to be in the moment". The patient indicated difficulty due to cost with his current medication therapy and will be reviewing this with Dr. Elna Breslow today. The OPT therapist will continue treatment work with the patient in his next scheduled session.  Plan: Return again in 2/3 weeks.  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 The patient was provided an opportunity to ask questions and all were answered. The patient agreed with the plan and demonstrated an understanding of the instructions.  The patient was advised to call back or seek an in-person evaluation if the symptoms worsen or if the condition fails to improve as anticipated.  I provided 40 minutes of non-face-to-face time during this encounter.  Winfred Burn, LCSW 03/09/2020

## 2020-03-10 ENCOUNTER — Telehealth: Payer: Self-pay

## 2020-03-10 NOTE — Telephone Encounter (Signed)
received notice that rexulti .25 mg was approved until 02-06-20  to  02-06-23

## 2020-03-18 ENCOUNTER — Other Ambulatory Visit: Payer: Self-pay | Admitting: Psychiatry

## 2020-03-18 DIAGNOSIS — F331 Major depressive disorder, recurrent, moderate: Secondary | ICD-10-CM

## 2020-03-18 DIAGNOSIS — F429 Obsessive-compulsive disorder, unspecified: Secondary | ICD-10-CM

## 2020-03-30 ENCOUNTER — Ambulatory Visit (INDEPENDENT_AMBULATORY_CARE_PROVIDER_SITE_OTHER): Payer: BC Managed Care – PPO | Admitting: Clinical

## 2020-03-30 ENCOUNTER — Other Ambulatory Visit: Payer: Self-pay

## 2020-03-30 ENCOUNTER — Telehealth: Payer: Self-pay | Admitting: Psychiatry

## 2020-03-30 DIAGNOSIS — F419 Anxiety disorder, unspecified: Secondary | ICD-10-CM

## 2020-03-30 DIAGNOSIS — F429 Obsessive-compulsive disorder, unspecified: Secondary | ICD-10-CM

## 2020-03-30 DIAGNOSIS — F902 Attention-deficit hyperactivity disorder, combined type: Secondary | ICD-10-CM

## 2020-03-30 DIAGNOSIS — F331 Major depressive disorder, recurrent, moderate: Secondary | ICD-10-CM

## 2020-03-30 NOTE — Telephone Encounter (Signed)
Attempted to reach patient.  Received a message from his therapist Mr. Suzan Garibaldi.  Left message for patient to call me back.

## 2020-03-30 NOTE — Progress Notes (Signed)
  Virtual Visit via Telephone Note  I connected withCullen Eulah Boyer on 03/30/20 at  3:00 PM EDT by telephoneand verified that I am speaking with the correct person using two identifiers.  Location: Patient:Home Provider:Office  I discussed the limitations of evaluation and management by telemedicine and the availability of in person appointments. The patient expressed understanding and agreed to proceed.   THERAPIST PROGRESS NOTE  Session Time:  3:00PM-3:30PM  Participation Level:Active  Behavioral Response:CasualAlertDepressed  Type of Therapy:Individual Therapy  Treatment Goals addressed:Coping  Interventions:CBT, Motivational Interviewing, Solution Focused and Supportive  Summary:Jeremy Kennedy Bucker Lambethis a 49 y.o.malewho presents with Depression with Anxiety/OCD/ADHD.The OPT therapist worked with thepatientfor his ongoing OPT treatment. The OPT therapist utilized Motivational Interviewing to assist in creating therapeutic repore. The patient in the session was engaged and work in collaboration giving feedback about his triggers and symptoms over the past few weeks including increase in intensity of symptoms as the patient was unable due to cost to get his prescription filled. The OPT therapist worked with the patients and encouraged the patient to collaborate with his psychiatrist to find a medicine that is covered by his insurance and indicated to manage his mental health symptoms (Depression). The OPT therapist utilized Cognitive Behavioral Therapy through cognitive restructuring as well as worked with the patient on coping strategies to assist in management ofhis mental health symptoms and the patient has been doing his therapy homework between his last session and current session by being active noting recently returning from [place] on a mini-vacation for golfing with his friends The patient was encouraged to continue to implement copings skills,  maintain active socialization,leisure, as well as at home task.  Suicidal/Homicidal:Nowithout intent/plan  Therapist Response:The OPT therapist worked with the patient for the patients ongoing OPT treatments. The patient was engaged in his session and gave feedback in relation to triggers, symptoms, and behavior responses over the pastfewweeks. The OPT therapist worked with the patient utilizing an in session Cognitive Behavioral Therapy exercise. The patient was responsive in the session and verbalized, " My symptoms have gotten more difficulty as I still am not taking medication, since I was unable to afford my prior prescription".The patient indicated he has notified his psychiatrist (The OPT therapist additionally messaged the psychiatrist) to see if there is a alternative medication at a manageable cost for the patient so that the patient can start medication indicated to manage his Depression symptoms. The OPT therapist will continue treatment work with the patient in his next scheduled session.  Plan: Return again in2/3weeks.  Diagnosis:Axis I:Recurrent moderate major depressive disorder with anxietyandObsessive-compulsive disorder with good or fair insightandAttention deficit hyperactivity disorder (ADHD), combined type  Axis II:No diagnosis  I discussed the assessment and treatment plan with the patient. The patient was provided an opportunity to ask questions and all were answered. The patient agreed with the plan and demonstrated an understanding of the instructions.  The patient was advised to call back or seek an in-person evaluation if the symptoms worsen or if the condition fails to improve as anticipated.  I provided65minutes of non-face-to-face time during this encounter.  Winfred Burn, LCSW 03/28/2020

## 2020-03-31 ENCOUNTER — Encounter: Payer: Self-pay | Admitting: Psychiatry

## 2020-03-31 ENCOUNTER — Telehealth (INDEPENDENT_AMBULATORY_CARE_PROVIDER_SITE_OTHER): Payer: BC Managed Care – PPO | Admitting: Psychiatry

## 2020-03-31 ENCOUNTER — Other Ambulatory Visit: Payer: Self-pay

## 2020-03-31 DIAGNOSIS — F902 Attention-deficit hyperactivity disorder, combined type: Secondary | ICD-10-CM | POA: Diagnosis not present

## 2020-03-31 DIAGNOSIS — Z9189 Other specified personal risk factors, not elsewhere classified: Secondary | ICD-10-CM

## 2020-03-31 DIAGNOSIS — F5105 Insomnia due to other mental disorder: Secondary | ICD-10-CM | POA: Diagnosis not present

## 2020-03-31 DIAGNOSIS — F429 Obsessive-compulsive disorder, unspecified: Secondary | ICD-10-CM

## 2020-03-31 DIAGNOSIS — F331 Major depressive disorder, recurrent, moderate: Secondary | ICD-10-CM | POA: Diagnosis not present

## 2020-03-31 MED ORDER — LURASIDONE HCL 20 MG PO TABS: 20.0000 mg | ORAL_TABLET | Freq: Every day | ORAL | 1 refills | Status: DC

## 2020-03-31 NOTE — Progress Notes (Signed)
Provider Location : [facility] Patient Location : [place]  Virtual Visit via Video Note  I connected with [name] on 03/31/20 at 11:00 AM EDT by a video enabled telemedicine application and verified that I am speaking with the correct person using two identifiers.   I discussed the limitations of evaluation and management by telemedicine and the availability of in person appointments. The patient expressed understanding and agreed to proceed.     I discussed the assessment and treatment plan with the patient. The patient was provided an opportunity to ask questions and all were answered. The patient agreed with the plan and demonstrated an understanding of the instructions.   The patient was advised to call back or seek an in-person evaluation if the symptoms worsen or if the condition fails to improve as anticipated.   BH MD OP Progress Note  03/31/2020 12:42 PM Kimari Coudriet  MRN:  [id number]  Chief Complaint:  Chief Complaint    Follow-up     HPI: Rocklin Soderquist is a 49 year old Caucasian male, married, employed, lives in [place], has a history of MDD, OCD, ADHD, hypertension was evaluated by telemedicine today.  Patient today reports he was unable to start the Rexulti due to the cost.  He hence continues to struggle with depressive symptoms.  Per nursing report, Rexulti was approved on 03/10/2020.  This was right after his last appointment with Clinical research associate.  Patient however reports in spite of that it was too expensive.  He struggles with low mood, lack of motivation, low energy and mild anxiety.  He reports his depression is more severe than his anxiety at this time.  He reports sleep is good.  Patient denies any suicidality, homicidality or perceptual disturbances.  He reports he is compliant on his medications including the Concerta.  The Concerta does help with his attention and focus.  He denies side effects to medications.  He continues to follow-up  with Mr. Suzan Garibaldi his therapist and reports therapy sessions is going well.  Reviewed and discussed EKG dated 03/13/2020-normal sinus rhythm.    Visit Diagnosis:    ICD-10-CM   1. MDD (major depressive disorder), recurrent episode, moderate (HCC)  F33.1 lurasidone (LATUDA) 20 MG TABS tablet  2. 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 Attention deficit hyperactivity disorder (ADHD), combined type  F90.2   4. Insomnia due to mental condition  F51.05   5. At risk for prolonged QT interval syndrome  Z91.89     Past Psychiatric History: I have reviewed past psychiatric history from my progress note on 10/23/2018.  Past trials of Paxil, Abilify, Trintellix, Depakote, mirtazapine, risperidone, duloxetine, Seroquel.  Past Medical History:  Past Medical History:  Diagnosis Date  . Anxiety and depression 12/01/2015  . Bipolar disorder (HCC)   . Cholecystitis   . Hypertension 07/05/2017  . Insomnia 07/05/2017  . MDD (major depressive disorder), recurrent episode, moderate (HCC) 01/13/2016  . OCD (obsessive compulsive disorder) 01/13/2016    Past Surgical History:  Procedure Laterality Date  . CHOLECYSTECTOMY N/A 07/10/2017   Procedure: LAPAROSCOPIC CHOLECYSTECTOMY;  Surgeon: Ancil Linsey, MD;  Location: [facility] ORS;  Service: General;  Laterality: N/A;  . VASECTOMY    . VASECTOMY REVERSAL      Family Psychiatric History: I have reviewed family psychiatric history from my progress note on 10/23/2018  Family History:  Family History  Problem Relation Age of Onset  . Anxiety disorder Mother   . Depression Mother   .  Alcohol abuse Father   . Anxiety disorder Sister   . Depression Sister     Social History: Reviewed social history from my progress note on 10/23/2018 Social History   Socioeconomic History  . Marital status: Married    Spouse name: nancy  . Number of children: 3  . Years of education: Not on file  . Highest education level: Bachelor's  degree (e.g., BA, AB, BS)  Occupational History  . Not on file  Tobacco Use  . Smoking status: Never Smoker  . Smokeless tobacco: Never Used  Vaping Use  . Vaping Use: Never used  Substance and Sexual Activity  . Alcohol use: Yes    Alcohol/week: 23.0 standard drinks    Types: 2 Glasses of wine, 14 Cans of beer, 7 Shots of liquor per week  . Drug use: No  . Sexual activity: Yes    Partners: Female    Birth control/protection: None  Other Topics Concern  . Not on file  Social History Narrative  . Not on file   Social Determinants of Health   Financial Resource Strain:   . Difficulty of Paying Living Expenses:   Food Insecurity:   . Worried About Programme researcher, broadcasting/film/video in the Last Year:   . Barista in the Last Year:   Transportation Needs:   . Freight forwarder (Medical):   Marland Kitchen Lack of Transportation (Non-Medical):   Physical Activity:   . Days of Exercise per Week:   . Minutes of Exercise per Session:   Stress:   . Feeling of Stress :   Social Connections:   . Frequency of Communication with Friends and Family:   . Frequency of Social Gatherings with Friends and Family:   . Attends Religious Services:   . Active Member of Clubs or Organizations:   . Attends Banker Meetings:   Marland Kitchen Marital Status:     Allergies: No Known Allergies  Metabolic Disorder Labs: No results found for: HGBA1C, MPG No results found for: PROLACTIN No results found for: CHOL, TRIG, HDL, CHOLHDL, VLDL, LDLCALC Lab Results  Component Value Date   TSH 3.980 10/23/2018    Therapeutic Level Labs: No results found for: LITHIUM No results found for: VALPROATE No components found for:  CBMZ  Current Medications: Current Outpatient Medications  Medication Sig Dispense Refill  . ALPRAZolam (XANAX) 1 MG tablet TAKE 1 TABLET BY MOUTH EVERY DAY AS NEEDED    . amoxicillin-clavulanate (AUGMENTIN) 875-125 MG tablet Take 1 tablet by mouth 2 (two) times daily.    .  bisoprolol-hydrochlorothiazide (ZIAC) 10-6.25 MG tablet Take 1 tablet by mouth daily.    . bisoprolol-hydrochlorothiazide (ZIAC) 10-6.25 MG tablet Take by mouth.    Marland Kitchen buPROPion (WELLBUTRIN SR) 150 MG 12 hr tablet TAKE 1 TABLET(150 MG) BY MOUTH TWICE DAILY 60 tablet 1  . busPIRone (BUSPAR) 7.5 MG tablet TAKE 1 TABLET(7.5 MG) BY MOUTH TWICE DAILY 60 tablet 1  . cyclobenzaprine (FLEXERIL) 10 MG tablet Take 10 mg by mouth 3 (three) times daily as needed for muscle spasms.     Marland Kitchen lurasidone (LATUDA) 20 MG TABS tablet Take 1 tablet (20 mg total) by mouth daily with supper. 30 tablet 1  . methylphenidate 18 MG PO CR tablet Take 1 tablet (18 mg total) by mouth daily. 30 tablet 0  . [START ON 04/10/2020] methylphenidate 18 MG PO CR tablet Take 1 tablet (18 mg total) by mouth every morning. 30 tablet 0  . rosuvastatin (CRESTOR)  10 MG tablet Take 10 mg by mouth at bedtime.    . valACYclovir (VALTREX) 500 MG tablet TAKE 1 TABLET BY MOUTH ONCE DAILY    . zolpidem (AMBIEN CR) 12.5 MG CR tablet TAKE 1 TABLET(12.5 MG) BY MOUTH AT BEDTIME AS NEEDED FOR SLEEP 30 tablet 3   No current facility-administered medications for this visit.     Musculoskeletal: Strength & Muscle Tone: UTA Gait & Station: normal Patient leans: N/A  Psychiatric Specialty Exam: Review of Systems  Psychiatric/Behavioral: Positive for dysphoric mood. The patient is nervous/anxious.   All other systems reviewed and are negative.   There were no vitals taken for this visit.There is no height or weight on file to calculate BMI.  General Appearance: Casual  Eye Contact:  Fair  Speech:  Clear and Coherent  Volume:  Normal  Mood:  Anxious and Depressed  Affect:  Congruent  Thought Process:  Goal Directed and Descriptions of Associations: Intact  Orientation:  Full (Time, Place, and Person)  Thought Content: Logical   Suicidal Thoughts:  No  Homicidal Thoughts:  No  Memory:  Immediate;   Fair Recent;   Fair Remote;   Fair   Judgement:  Fair  Insight:  Fair  Psychomotor Activity:  Normal  Concentration:  Concentration: Fair and Attention Span: Fair  Recall:  [employer] of Knowledge: Fair  Language: Fair  Akathisia:  No  Handed:  Right  AIMS (if indicated):UTA  Assets:  Communication Skills Desire for Improvement Housing Intimacy Social Support  ADL's:  Intact  Cognition: WNL  Sleep:  Fair   Screenings:   Assessment and Plan: Christropher Gintz is a 49 year old Caucasian male, married, employed, lives in [place], has a history of MDD, OCD, ADHD, insomnia was evaluated by telemedicine today.  Patient is currently struggling with depressive symptoms and has been noncompliant with Rexulti due to cost.  Patient will continue to benefit from medication readjustment and psychotherapy sessions.  Plan as noted below.  Plan MDD-unstable Continue Wellbutrin SR 150 mg p.o. twice daily BuSpar 7.5 mg p.o. twice daily Discontinue Rexulti due to cost. Start Latuda 20 mg p.o. daily with supper. If he is unable to afford it we will start Geodon.  OCD-stable Continue CBT Continue BuSpar  ADHD-stable Concerta 18 mg p.o. daily Patient has pending prescription at the pharmacy. I have reviewed  controlled substance database  Insomnia-stable Ambien CR 12.5 mg p.o. nightly  At risk for prolonged QT syndrome-I have reviewed EKG dated March 13, 2020-normal sinus rhythm.  This was discussed with patient  Follow-up in clinic in 4 weeks or sooner if needed.  I have spent atleast 20 minutes non face to face with patient today. More than 50 % of the time was spent for preparing to see the patient ( e.g., review of test, records ), obtaining and to review and separately obtained history , ordering medications and test ,psychoeducation and supportive psychotherapy and care coordination,as well as documenting clinical information in electronic health record. This note was generated in part or whole with voice  recognition software. Voice recognition is usually quite accurate but there are transcription errors that can and very often do occur. I apologize for any typographical errors that were not detected and corrected.           Jomarie Longs, MD 03/31/2020, 12:42 PM

## 2020-04-01 ENCOUNTER — Telehealth: Payer: Self-pay

## 2020-04-01 NOTE — Telephone Encounter (Signed)
received fax that states a prior authoriazation was needed for latuda 20mg 

## 2020-04-01 NOTE — Telephone Encounter (Signed)
latuda was approved from 03-31-20 to 04-01-2023

## 2020-04-14 ENCOUNTER — Other Ambulatory Visit: Payer: Self-pay | Admitting: Psychiatry

## 2020-04-14 DIAGNOSIS — F331 Major depressive disorder, recurrent, moderate: Secondary | ICD-10-CM

## 2020-04-20 ENCOUNTER — Other Ambulatory Visit: Payer: Self-pay

## 2020-04-20 ENCOUNTER — Ambulatory Visit (INDEPENDENT_AMBULATORY_CARE_PROVIDER_SITE_OTHER): Payer: BC Managed Care – PPO | Admitting: Clinical

## 2020-04-20 DIAGNOSIS — F429 Obsessive-compulsive disorder, unspecified: Secondary | ICD-10-CM | POA: Diagnosis not present

## 2020-04-20 DIAGNOSIS — F331 Major depressive disorder, recurrent, moderate: Secondary | ICD-10-CM

## 2020-04-20 DIAGNOSIS — F902 Attention-deficit hyperactivity disorder, combined type: Secondary | ICD-10-CM

## 2020-04-20 DIAGNOSIS — F419 Anxiety disorder, unspecified: Secondary | ICD-10-CM

## 2020-04-20 NOTE — Progress Notes (Signed)
Virtual Visit via Video Note  I connected with Jeremy Boyer on 04/20/20 at  3:00 PM EDT by a video enabled telemedicine application and verified that I am speaking with the correct person using two identifiers.  Location: Patient:Home Provider:Office  I discussed the limitations of evaluation and management by telemedicine and the availability of in person appointments. The patient expressed understanding and agreed to proceed.   THERAPIST PROGRESS NOTE  Session Time:3:00PM-3:30PM  Participation Level:Active  Behavioral Response:CasualAlertDepressed  Type of Therapy:Individual Therapy  Treatment Goals addressed:Coping  Interventions:CBT, Motivational Interviewing, Solution Focused and Supportive  Summary:Jeremy Kennedy Bucker Lambethis a 49 y.o.malewho presents with Depression with Anxiety/OCD/ADHD.The OPT therapist worked with thepatientfor hisongoing OPT treatment. The OPT therapist utilized Motivational Interviewing to assist in creating therapeutic repore. The patient in the session was engaged and work in collaboration giving feedback about his triggers and symptoms over the past few weeks including adjusting to new medication Latuda. The OPT therapist worked with the patients and encouraged the patient to stay active as part of his non-medication treatment using coping skills and not self isolating to manage his mental health symptoms (Depression). The OPT therapist utilized Cognitive Behavioral Therapy through cognitive restructuring as well as worked with the patient on staying positive, challenging negative thoughts, and talking himself into being active and involved in social opportunities with family and friends.  Suicidal/Homicidal:Nowithout intent/plan  Therapist Response:The OPT therapist worked with the patient for the patientsongoing OPT treatments.The patient was engaged in his session and gave feedback in relation to triggers, symptoms,  and behavior responses over the pastfewweeks. The OPT therapist worked with the patient utilizing an in session Cognitive Behavioral Therapy exercise. The patient was responsive in the session and verbalized willingness to continue to push to stay active and socially involved with friends and family members while agreeing to continue to stay compliant with his new medication giving the full evaluation period. The OPT therapist will continue treatment work with the patient in his next scheduled session.  Plan: Return again in2/3weeks.  Diagnosis:Axis I:Recurrent moderate major depressive disorder with anxietyandObsessive-compulsive disorder with good or fair insightandAttention deficit hyperactivity disorder (ADHD), combined type  Axis II:No diagnosis  I discussed the assessment and treatment plan with the patient. The patient was provided an opportunity to ask questions and all were answered. The patient agreed with the plan and demonstrated an understanding of the instructions.  The patient was advised to call back or seek an in-person evaluation if the symptoms worsen or if the condition fails to improve as anticipated.  I provided28minutes of non-face-to-face time during this encounter.  Winfred Burn, LCSW 04/20/2020

## 2020-04-30 ENCOUNTER — Other Ambulatory Visit: Payer: Self-pay | Admitting: Child and Adolescent Psychiatry

## 2020-04-30 DIAGNOSIS — F331 Major depressive disorder, recurrent, moderate: Secondary | ICD-10-CM

## 2020-05-01 NOTE — Telephone Encounter (Signed)
I have sent Ambien to pharmacy. °

## 2020-05-05 ENCOUNTER — Other Ambulatory Visit: Payer: Self-pay

## 2020-05-05 ENCOUNTER — Encounter: Payer: Self-pay | Admitting: Psychiatry

## 2020-05-05 ENCOUNTER — Telehealth (INDEPENDENT_AMBULATORY_CARE_PROVIDER_SITE_OTHER): Payer: BC Managed Care – PPO | Admitting: Psychiatry

## 2020-05-05 DIAGNOSIS — F331 Major depressive disorder, recurrent, moderate: Secondary | ICD-10-CM

## 2020-05-05 DIAGNOSIS — F902 Attention-deficit hyperactivity disorder, combined type: Secondary | ICD-10-CM | POA: Diagnosis not present

## 2020-05-05 DIAGNOSIS — F429 Obsessive-compulsive disorder, unspecified: Secondary | ICD-10-CM | POA: Diagnosis not present

## 2020-05-05 DIAGNOSIS — F5105 Insomnia due to other mental disorder: Secondary | ICD-10-CM

## 2020-05-05 MED ORDER — BUPROPION HCL ER (SR) 150 MG PO TB12: ORAL_TABLET | ORAL | 1 refills | Status: DC

## 2020-05-05 MED ORDER — LURASIDONE HCL 40 MG PO TABS: 40.0000 mg | ORAL_TABLET | Freq: Every day | ORAL | 1 refills | Status: DC

## 2020-05-05 MED ORDER — BUSPIRONE HCL 7.5 MG PO TABS: ORAL_TABLET | ORAL | 1 refills | Status: DC

## 2020-05-05 MED ORDER — METHYLPHENIDATE HCL ER (OSM) 18 MG PO TBCR: 18.0000 mg | EXTENDED_RELEASE_TABLET | Freq: Every morning | ORAL | 0 refills | Status: DC

## 2020-05-05 NOTE — Progress Notes (Signed)
Provider Location : [facility] Patient Location : Home  Virtual Visit via Video Note  I connected with Jeremy Boyer on 05/05/20 at  4:40 PM EDT by a video enabled telemedicine application and verified that I am speaking with the correct person using two identifiers.   I discussed the limitations of evaluation and management by telemedicine and the availability of in person appointments. The patient expressed understanding and agreed to proceed.     I discussed the assessment and treatment plan with the patient. The patient was provided an opportunity to ask questions and all were answered. The patient agreed with the plan and demonstrated an understanding of the instructions.   The patient was advised to call back or seek an in-person evaluation if the symptoms worsen or if the condition fails to improve as anticipated.   BH MD OP Progress Note  05/05/2020 5:14 PM Jeremy Boyer  MRN:  [id number]  Chief Complaint:  Chief Complaint    Follow-up     HPI: Jeremy Boyer is a 49 year old Caucasian male, married, employed, lives in [place], has a history of MDD, OCD, ADHD, hypertension was evaluated by telemedicine today.  Patient today reports since starting the Latuda 20 mg his mood swings have improved.  He reports he feels more stable however continues to struggle with depression and anxiety symptoms.  He continues to have nervousness as well as low mood on and off.  He is interested in dosage increase of his Jordan.  So far he is tolerating it well.  He denies side effects.  He reports sleep continues to be good.  He continues to take Ambien which helps.  Patient continues to do well with his attention and focus on the Concerta.  He denies any side effects to the medication.  He has been monitoring his blood pressure and heart rate and reports they are good.  He continues to work with his therapist Mr. Suzan Garibaldi.  He reports school is going to reopen on August 16  and he is currently a bit stressed about the same.  Patient denies any suicidality, homicidality or perceptual disturbances.  Visit Diagnosis:    ICD-10-CM   1. MDD (major depressive disorder), recurrent episode, moderate (HCC)  F33.1 lurasidone (LATUDA) 40 MG TABS tablet    busPIRone (BUSPAR) 7.5 MG tablet    buPROPion (WELLBUTRIN SR) 150 MG 12 hr tablet  2. Obsessive-compulsive disorder with good or fair insight  F42.9 lurasidone (LATUDA) 40 MG TABS tablet    busPIRone (BUSPAR) 7.5 MG tablet  3. ADHD (attention deficit hyperactivity disorder), combined type  F90.2 methylphenidate 18 MG PO CR tablet  4. Insomnia due to mental condition  F51.05     Past Psychiatric History: I have reviewed past psychiatric history from my progress note on 10/23/2018.  Past trials of Paxil, Abilify, Trintellix, Depakote, mirtazapine, risperidone, duloxetine, Seroquel  Past Medical History:  Past Medical History:  Diagnosis Date  . Anxiety and depression 12/01/2015  . Bipolar disorder (HCC)   . Cholecystitis   . Hypertension 07/05/2017  . Insomnia 07/05/2017  . MDD (major depressive disorder), recurrent episode, moderate (HCC) 01/13/2016  . OCD (obsessive compulsive disorder) 01/13/2016    Past Surgical History:  Procedure Laterality Date  . CHOLECYSTECTOMY N/A 07/10/2017   Procedure: LAPAROSCOPIC CHOLECYSTECTOMY;  Surgeon: Ancil Linsey, MD;  Location: [facility] ORS;  Service: General;  Laterality: N/A;  . VASECTOMY    . VASECTOMY REVERSAL      Family Psychiatric History: I have  reviewed family psychiatric history from my progress note on 10/23/2018  Family History:  Family History  Problem Relation Age of Onset  . Anxiety disorder Mother   . Depression Mother   . Alcohol abuse Father   . Anxiety disorder Sister   . Depression Sister     Social History: I have reviewed social history from my progress note on 10/23/2018 Social History   Socioeconomic History  . Marital status: Married     Spouse name: nancy  . Number of children: 3  . Years of education: Not on file  . Highest education level: Bachelor's degree (e.g., BA, AB, BS)  Occupational History  . Not on file  Tobacco Use  . Smoking status: Never Smoker  . Smokeless tobacco: Never Used  Vaping Use  . Vaping Use: Never used  Substance and Sexual Activity  . Alcohol use: Yes    Alcohol/week: 23.0 standard drinks    Types: 2 Glasses of wine, 14 Cans of beer, 7 Shots of liquor per week  . Drug use: No  . Sexual activity: Yes    Partners: Female    Birth control/protection: None  Other Topics Concern  . Not on file  Social History Narrative  . Not on file   Social Determinants of Health   Financial Resource Strain:   . Difficulty of Paying Living Expenses:   Food Insecurity:   . Worried About Programme researcher, broadcasting/film/video in the Last Year:   . Barista in the Last Year:   Transportation Needs:   . Freight forwarder (Medical):   Marland Kitchen Lack of Transportation (Non-Medical):   Physical Activity:   . Days of Exercise per Week:   . Minutes of Exercise per Session:   Stress:   . Feeling of Stress :   Social Connections:   . Frequency of Communication with Friends and Family:   . Frequency of Social Gatherings with Friends and Family:   . Attends Religious Services:   . Active Member of Clubs or Organizations:   . Attends Banker Meetings:   Marland Kitchen Marital Status:     Allergies: No Known Allergies  Metabolic Disorder Labs: No results found for: HGBA1C, MPG No results found for: PROLACTIN No results found for: CHOL, TRIG, HDL, CHOLHDL, VLDL, LDLCALC Lab Results  Component Value Date   TSH 3.980 10/23/2018    Therapeutic Level Labs: No results found for: LITHIUM No results found for: VALPROATE No components found for:  CBMZ  Current Medications: Current Outpatient Medications  Medication Sig Dispense Refill  . ALPRAZolam (XANAX) 1 MG tablet TAKE 1 TABLET BY MOUTH EVERY DAY AS NEEDED     . amoxicillin-clavulanate (AUGMENTIN) 875-125 MG tablet Take 1 tablet by mouth 2 (two) times daily.    . bisoprolol-hydrochlorothiazide (ZIAC) 10-6.25 MG tablet Take 1 tablet by mouth daily.    . bisoprolol-hydrochlorothiazide (ZIAC) 10-6.25 MG tablet Take by mouth.    Marland Kitchen buPROPion (WELLBUTRIN SR) 150 MG 12 hr tablet TAKE 1 TABLET(150 MG) BY MOUTH TWICE DAILY 60 tablet 1  . busPIRone (BUSPAR) 7.5 MG tablet TAKE 1 TABLET(7.5 MG) BY MOUTH TWICE DAILY 60 tablet 1  . cyclobenzaprine (FLEXERIL) 10 MG tablet Take 10 mg by mouth 3 (three) times daily as needed for muscle spasms.     Marland Kitchen lurasidone (LATUDA) 40 MG TABS tablet Take 1 tablet (40 mg total) by mouth daily with supper. 30 tablet 1  . methylphenidate 18 MG PO CR tablet Take 1 tablet (  18 mg total) by mouth daily. 30 tablet 0  . [START ON 05/13/2020] methylphenidate 18 MG PO CR tablet Take 1 tablet (18 mg total) by mouth every morning. 30 tablet 0  . rosuvastatin (CRESTOR) 10 MG tablet Take 10 mg by mouth at bedtime.    . valACYclovir (VALTREX) 500 MG tablet TAKE 1 TABLET BY MOUTH ONCE DAILY    . zolpidem (AMBIEN CR) 12.5 MG CR tablet TAKE 1 TABLET(12.5 MG) BY MOUTH AT BEDTIME AS NEEDED FOR SLEEP 30 tablet 1   No current facility-administered medications for this visit.     Musculoskeletal: Strength & Muscle Tone: UTA Gait & Station: normal Patient leans: N/A  Psychiatric Specialty Exam: Review of Systems  Psychiatric/Behavioral: Positive for dysphoric mood. The patient is nervous/anxious.   All other systems reviewed and are negative.   There were no vitals taken for this visit.There is no height or weight on file to calculate BMI.  General Appearance: Casual  Eye Contact:  Fair  Speech:  Normal Rate  Volume:  Normal  Mood:  Anxious and Depressed  Affect:  Congruent  Thought Process:  Goal Directed and Descriptions of Associations: Intact  Orientation:  Full (Time, Place, and Person)  Thought Content: Logical   Suicidal Thoughts:   No  Homicidal Thoughts:  No  Memory:  Immediate;   Fair Recent;   Fair Remote;   Fair  Judgement:  Fair  Insight:  Fair  Psychomotor Activity:  Normal  Concentration:  Concentration: Fair and Attention Span: Fair  Recall:  [employer] of Knowledge: Fair  Language: Fair  Akathisia:  No  Handed:  Right  AIMS (if indicated): UTA  Assets:  Communication Skills Desire for Improvement Housing Social Support  ADL's:  Intact  Cognition: WNL  Sleep:  Fair   Screenings:   Assessment and Plan: Jeremy Boyer is a 49 year old Caucasian male, married, employed, lives in [place], has a history of MDD, OCD, ADHD, insomnia was evaluated by telemedicine today.  Patient is currently making progress on the Jordan however continues to struggle with depression and anxiety symptoms and will benefit from medication readjustment.  Plan as noted below.  Plan MDD-improving Wellbutrin SR 150 mg p.o. twice daily BuSpar 7.5 mg p.o. twice daily Increase Latuda to 40 mg p.o. daily with supper  OCD-stable Continue CBT Continue BuSpar as prescribed  ADHD-stable Concerta 18 mg p.o. daily I have reviewed [place] controlled substance database Patient to monitor his blood pressure and heart rate closely and continue to follow-up with primary care provider for the same.  Patient advised to continue psychotherapy sessions with Mr. Suzan Garibaldi.  Follow-up in clinic in 4 to 5 weeks or sooner if needed.  I have spent atleast 20 minutes non face to face with patient today. More than 50 % of the time was spent for preparing to see the patient ( e.g., review of test, records ),  ordering medications and test ,psychoeducation and supportive psychotherapy and care coordination,as well as documenting clinical information in electronic health record. This note was generated in part or whole with voice recognition software. Voice recognition is usually quite accurate but there are transcription errors that can and  very often do occur. I apologize for any typographical errors that were not detected and corrected.        Jomarie Longs, MD 05/05/2020, 5:14 PM

## 2020-05-07 ENCOUNTER — Other Ambulatory Visit: Payer: Self-pay

## 2020-05-07 ENCOUNTER — Ambulatory Visit (INDEPENDENT_AMBULATORY_CARE_PROVIDER_SITE_OTHER): Payer: BC Managed Care – PPO | Admitting: Clinical

## 2020-05-07 DIAGNOSIS — F331 Major depressive disorder, recurrent, moderate: Secondary | ICD-10-CM | POA: Diagnosis not present

## 2020-05-07 DIAGNOSIS — F429 Obsessive-compulsive disorder, unspecified: Secondary | ICD-10-CM

## 2020-05-07 DIAGNOSIS — F419 Anxiety disorder, unspecified: Secondary | ICD-10-CM

## 2020-05-07 DIAGNOSIS — F902 Attention-deficit hyperactivity disorder, combined type: Secondary | ICD-10-CM | POA: Diagnosis not present

## 2020-05-07 NOTE — Progress Notes (Signed)
Virtual Visit via Telephone Note  I connected with Jeremy Boyer on 05/07/20 at  3:00 PM EDT by telephone and verified that I am speaking with the correct person using two identifiers.  Location: Patient:Home Provider:Office  I discussed the limitations of evaluation and management by telemedicine and the availability of in person appointments. The patient expressed understanding and agreed to proceed.   THERAPIST PROGRESS NOTE  Session Time:3:00PM-3:30PM  Participation Level:Active  Behavioral Response:CasualAlertDepressed  Type of Therapy:Individual Therapy  Treatment Goals addressed:Coping  Interventions:CBT, Motivational Interviewing, Solution Focused and Supportive  Summary:Jeremy Kennedy Bucker Lambethis a 49 y.o.malewho presents with Depression with Anxiety/OCD/ADHD.The OPT therapist worked with thepatientfor hisongoing OPT treatment. The OPT therapist utilized Motivational Interviewing to assist in creating therapeutic repore. 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  Suicidal/Homicidal:Nowithout intent/plan  Therapist Response:The OPT therapist worked with the patient for the patientsongoing OPT treatments.The  patient was engaged in his session and gave feedback in relation to triggers, symptoms, and behavior responses over the pastfewweeks. The OPT therapist worked with the patient utilizing an in session Cognitive Behavioral Therapy exercise. The patient was responsive in the session and verbalized willingness to continue to push to stay active and socially involved with friends and family members including working with his significant other on changes around the home with his children are in day care. 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  Plan: Return again in2/3weeks.  Diagnosis:Axis I:Recurrent moderate major depressive disorder with anxietyandObsessive-compulsive disorder with good or fair insightandAttention deficit hyperactivity disorder (ADHD), combined type  Axis II:No diagnosis  I discussed the assessment and treatment plan with the patient. The patient was provided an opportunity to ask questions and all were answered. The patient agreed with the plan and demonstrated an understanding of the instructions.  The patient was advised to call back or seek an in-person evaluation if the symptoms worsen or if the condition fails to improve as anticipated.  I provided63minutes of non-face-to-face time during this encounter.  Winfred Burn, LCSW 05/07/2020

## 2020-05-20 ENCOUNTER — Telehealth: Payer: Self-pay

## 2020-05-20 DIAGNOSIS — F429 Obsessive-compulsive disorder, unspecified: Secondary | ICD-10-CM

## 2020-05-20 DIAGNOSIS — F331 Major depressive disorder, recurrent, moderate: Secondary | ICD-10-CM

## 2020-05-20 MED ORDER — BUPROPION HCL ER (SR) 150 MG PO TB12: 150.0000 mg | ORAL_TABLET | Freq: Every day | ORAL | 1 refills | Status: DC

## 2020-05-20 MED ORDER — LURASIDONE HCL 40 MG PO TABS: 20.0000 mg | ORAL_TABLET | Freq: Two times a day (BID) | ORAL | 1 refills | Status: DC

## 2020-05-20 NOTE — Telephone Encounter (Signed)
Returned call to patient.  He reports since the past 10 days, since increasing the Latuda to 40 mg daily with supper he is more anxious and has not been able to sleep.  Discussed with patient to divide the Latuda to 20 mg p.o. twice daily.  Discussed to reduce Wellbutrin to just once a day instead of twice a day.  He will call me back in 4 to 5 days if he continues to have worsening symptoms.

## 2020-05-20 NOTE — Telephone Encounter (Signed)
pt called left message that the latuda is not working for him not sleeping

## 2020-06-22 ENCOUNTER — Encounter: Payer: Self-pay | Admitting: Psychiatry

## 2020-06-22 ENCOUNTER — Telehealth (INDEPENDENT_AMBULATORY_CARE_PROVIDER_SITE_OTHER): Payer: BC Managed Care – PPO | Admitting: Psychiatry

## 2020-06-22 ENCOUNTER — Other Ambulatory Visit: Payer: Self-pay

## 2020-06-22 DIAGNOSIS — F5105 Insomnia due to other mental disorder: Secondary | ICD-10-CM | POA: Diagnosis not present

## 2020-06-22 DIAGNOSIS — F902 Attention-deficit hyperactivity disorder, combined type: Secondary | ICD-10-CM

## 2020-06-22 DIAGNOSIS — F331 Major depressive disorder, recurrent, moderate: Secondary | ICD-10-CM

## 2020-06-22 DIAGNOSIS — F429 Obsessive-compulsive disorder, unspecified: Secondary | ICD-10-CM

## 2020-06-22 MED ORDER — VORTIOXETINE HBR 20 MG PO TABS: 10.0000 mg | ORAL_TABLET | Freq: Every day | ORAL | 1 refills | Status: DC

## 2020-06-22 NOTE — Patient Instructions (Signed)
Vortioxetine oral tablet What is this medicine? Vortioxetine (vor tee OX e teen) is used to treat depression. This medicine may be used for other purposes; ask your health care provider or pharmacist if you have questions. COMMON BRAND NAME(S): BRINTELLIX, Trintellix What should I tell my health care provider before I take this medicine? They need to know if you have any of these conditions:  bipolar disorder or a family history of bipolar disorder  bleeding disorders  drink alcohol  glaucoma  liver disease  low levels of sodium in the blood  seizures  suicidal thoughts, plans, or attempt; a previous suicide attempt by you or a family member  take medicines that treat or prevent blood clots  an unusual or allergic reaction to vortioxetine, other medicines, foods, dyes, or preservatives  pregnant or trying to get pregnant  breast-feeding How should I use this medicine? Take this medicine by mouth with a glass of water. Follow the directions on the prescription label. You can take it with or without food. If it upsets your stomach, take it with food. Take your medicine at regular intervals. Do not take it more often than directed. Do not stop taking this medicine suddenly except upon the advice of your doctor. Stopping this medicine too quickly may cause serious side effects or your condition may worsen. A special MedGuide will be given to you by the pharmacist with each prescription and refill. Be sure to read this information carefully each time. Talk to your pediatrician regarding the use of this medicine in children. Special care may be needed. Overdosage: If you think you have taken too much of this medicine contact a poison control center or emergency room at once. NOTE: This medicine is only for you. Do not share this medicine with others. What if I miss a dose? If you miss a dose, take it as soon as you can. If it is almost time for your next dose, take only that dose. Do  not take double or extra doses. What may interact with this medicine? Do not take this medicine with any of the following medications:  linezolid  MAOIs like Carbex, Eldepryl, Marplan, Nardil, and Parnate  methylene blue (injected into a vein) This medicine may also interact with the following medications:  alcohol  aspirin and aspirin-like medicines  carbamazepine  certain medicines for depression, anxiety, or psychotic disturbances  certain medicines for migraine headache like almotriptan, eletriptan, frovatriptan, naratriptan, rizatriptan, sumatriptan, zolmitriptan  diuretics  fentanyl  furazolidone  isoniazid  medicines that treat or prevent blood clots like warfarin, enoxaparin, and dalteparin  NSAIDs, medicines for pain and inflammation, like ibuprofen or naproxen  phenytoin  procarbazine  quinidine  rasagiline  rifampin  supplements like St. John's wort, kava kava, valerian  tramadol  tryptophan This list may not describe all possible interactions. Give your health care provider a list of all the medicines, herbs, non-prescription drugs, or dietary supplements you use. Also tell them if you smoke, drink alcohol, or use illegal drugs. Some items may interact with your medicine. What should I watch for while using this medicine? Tell your doctor if your symptoms do not get better or if they get worse. Visit your doctor or health care professional for regular checks on your progress. Because it may take several weeks to see the full effects of this medicine, it is important to continue your treatment as prescribed by your doctor. Patients and their families should watch out for new or worsening thoughts of suicide or   depression. Also watch out for sudden changes in feelings such as feeling anxious, agitated, panicky, irritable, hostile, aggressive, impulsive, severely restless, overly excited and hyperactive, or not being able to sleep. If this happens,  especially at the beginning of treatment or after a change in dose, call your health care professional. You may get drowsy or dizzy. Do not drive, use machinery, or do anything that needs mental alertness until you know how this medicine affects you. Do not stand or sit up quickly, especially if you are an older patient. This reduces the risk of dizzy or fainting spells. Alcohol may interfere with the effect of this medicine. Avoid alcoholic drinks. Your mouth may get dry. Chewing sugarless gum or sucking hard candy, and drinking plenty of water may help. Contact your doctor if the problem does not go away or is severe. What side effects may I notice from receiving this medicine? Side effects that you should report to your doctor or health care professional as soon as possible:  allergic reactions like skin rash, itching or hives, swelling of the face, lips, or tongue  anxious  black, tarry stools  changes in vision  confusion  elevated mood, decreased need for sleep, racing thoughts, impulsive behavior  eye pain  fast, irregular heartbeat  feeling faint or lightheaded, falls  feeling agitated, angry, or irritable  hallucination, loss of contact with reality  loss of balance or coordination  loss of memory  painful or prolonged erections  restlessness, pacing, inability to keep still  seizures  stiff muscles  suicidal thoughts or other mood changes  trouble sleeping  unusual bleeding or bruising  unusually weak or tired  vomiting Side effects that usually do not require medical attention (report to your doctor or health care professional if they continue or are bothersome):  change in appetite or weight  change in sex drive or performance  constipation  dizziness  dry mouth  nausea This list may not describe all possible side effects. Call your doctor for medical advice about side effects. You may report side effects to FDA at 1-800-FDA-1088. Where  should I keep my medicine? Keep out of the reach of children. Store at room temperature between 15 and 30 degrees C (59 and 86 degrees F). Throw away any unused medicine after the expiration date. NOTE: This sheet is a summary. It may not cover all possible information. If you have questions about this medicine, talk to your doctor, pharmacist, or health care provider.  2020 Elsevier/Gold Standard (2016-02-18 16:45:13)  

## 2020-06-22 NOTE — Progress Notes (Signed)
Provider Location : [facility] Patient Location : Home  Participants: Patient , Provider  Virtual Visit via Video Note  I connected with Jeremy Overmanullen Grant Limes on 06/22/20 at  4:00 PM EDT by a video enabled telemedicine application and verified that I am speaking with the correct person using two identifiers.   I discussed the limitations of evaluation and management by telemedicine and the availability of in person appointments. The patient expressed understanding and agreed to proceed.    I discussed the assessment and treatment plan with the patient. The patient was provided an opportunity to ask questions and all were answered. The patient agreed with the plan and demonstrated an understanding of the instructions.   The patient was advised to call back or seek an in-person evaluation if the symptoms worsen or if the condition fails to improve as anticipated.  Video connection was lost at less than 50% of the duration of the visit, at which time the remainder of the visit was completed through audio only    [facility]  06/22/2020 4:36 PM Jeremy Boyer  MRN:  [id number]  Chief Complaint:  Chief Complaint    Follow-up     HPI: Jeremy Boyer is a 49 year old Caucasian male, married, employed, [place], has a history of MDD, OCD, ADHD, hypertension was evaluated by telemedicine today.  Patient today reports he is currently struggling with his mood symptoms.  He reports he feels depressed as well as has been struggling with anxiety symptoms.  He reports when he took the JordanLatuda it made him feel weird.  He stopped taking it.  He is also completely off of the Wellbutrin since the past 1 week or more.  He also had to stop the Concerta about a week or so ago.  He reports he and other family members came down with a stomach virus and that is why he decided to stop the Concerta.  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  He also has not been following up with his therapist however agrees to reconnect and start psychotherapy sessions again.  He continues to take the Ambien which does help with his sleep.  Patient denies any suicidality, homicidality or perceptual disturbances.  Patient denies any other concerns today.  Visit Diagnosis:    ICD-10-CM   1. MDD (major depressive disorder), recurrent episode, moderate (HCC)  F33.1 vortioxetine HBr (TRINTELLIX) 20 MG TABS tablet  2. 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 ADHD (attention deficit hyperactivity disorder), combined type  F90.2   4. Insomnia due to mental condition  F51.05     Past Psychiatric History: I have reviewed past psychiatric history from my progress note on 10/23/2018.  Past trials of Paxil, Abilify, Trintellix, Depakote, mirtazapine, risperidone, duloxetine, Seroquel  Past Medical History:  Past Medical History:  Diagnosis Date  . Anxiety and depression 12/01/2015  . Bipolar disorder (HCC)   . Cholecystitis   . Hypertension 07/05/2017  . Insomnia 07/05/2017  . MDD (major depressive disorder), recurrent episode, moderate (HCC) 01/13/2016  . OCD (obsessive compulsive disorder) 01/13/2016    Past Surgical History:  Procedure Laterality Date  . CHOLECYSTECTOMY N/A 07/10/2017   Procedure: LAPAROSCOPIC CHOLECYSTECTOMY;  Surgeon: Ancil Linseyavis, Jason Evan, MD;  Location: [facility] ORS;  Service: General;  Laterality: N/A;  . VASECTOMY    . VASECTOMY REVERSAL  Family Psychiatric History: I have reviewed family psychiatric history from my progress note on 10/23/2018 Family History:  Family History  Problem Relation Age of Onset  . Anxiety disorder Mother   . Depression Mother   . Alcohol abuse Father   . Anxiety disorder Sister   . Depression Sister     Social History: Reviewed social history from my progress note on 10/23/2018 Social History    Socioeconomic History  . Marital status: Married    Spouse name: nancy  . Number of children: 3  . Years of education: Not on file  . Highest education level: Bachelor's degree (e.g., BA, AB, BS)  Occupational History  . Not on file  Tobacco Use  . Smoking status: Never Smoker  . Smokeless tobacco: Never Used  Vaping Use  . Vaping Use: Never used  Substance and Sexual Activity  . Alcohol use: Yes    Alcohol/week: 23.0 standard drinks    Types: 2 Glasses of wine, 14 Cans of beer, 7 Shots of liquor per week  . Drug use: No  . Sexual activity: Yes    Partners: Female    Birth control/protection: None  Other Topics Concern  . Not on file  Social History Narrative  . Not on file   Social Determinants of Health   Financial Resource Strain:   . Difficulty of Paying Living Expenses: Not on file  Food Insecurity:   . Worried About Programme researcher, broadcasting/film/video in the Last Year: Not on file  . Ran Out of Food in the Last Year: Not on file  Transportation Needs:   . Lack of Transportation (Medical): Not on file  . Lack of Transportation (Non-Medical): Not on file  Physical Activity:   . Days of Exercise per Week: Not on file  . Minutes of Exercise per Session: Not on file  Stress:   . Feeling of Stress : Not on file  Social Connections:   . Frequency of Communication with Friends and Family: Not on file  . Frequency of Social Gatherings with Friends and Family: Not on file  . Attends Religious Services: Not on file  . Active Member of Clubs or Organizations: Not on file  . Attends Banker Meetings: Not on file  . Marital Status: Not on file    Allergies: No Known Allergies  Metabolic Disorder Labs: No results found for: HGBA1C, MPG No results found for: PROLACTIN No results found for: CHOL, TRIG, HDL, CHOLHDL, VLDL, LDLCALC Lab Results  Component Value Date   TSH 3.980 10/23/2018    Therapeutic Level Labs: No results found for: LITHIUM No results found for:  VALPROATE No components found for:  CBMZ  Current Medications: Current Outpatient Medications  Medication Sig Dispense Refill  . ALPRAZolam (XANAX) 1 MG tablet TAKE 1 TABLET BY MOUTH EVERY DAY AS NEEDED    . amoxicillin-clavulanate (AUGMENTIN) 875-125 MG tablet Take 1 tablet by mouth 2 (two) times daily.    . bisoprolol-hydrochlorothiazide (ZIAC) 10-6.25 MG tablet Take 1 tablet by mouth daily.    . bisoprolol-hydrochlorothiazide (ZIAC) 10-6.25 MG tablet Take by mouth.    . busPIRone (BUSPAR) 7.5 MG tablet TAKE 1 TABLET(7.5 MG) BY MOUTH TWICE DAILY 60 tablet 1  . cyclobenzaprine (FLEXERIL) 10 MG tablet Take 10 mg by mouth 3 (three) times daily as needed for muscle spasms.     . methylphenidate 18 MG PO CR tablet Take 1 tablet (18 mg total) by mouth daily. 30 tablet 0  . methylphenidate 18  MG PO CR tablet Take 1 tablet (18 mg total) by mouth every morning. 30 tablet 0  . rosuvastatin (CRESTOR) 10 MG tablet Take 10 mg by mouth at bedtime.    . valACYclovir (VALTREX) 500 MG tablet TAKE 1 TABLET BY MOUTH ONCE DAILY    . vortioxetine HBr (TRINTELLIX) 20 MG TABS tablet Take 0.5 tablets (10 mg total) by mouth daily. 15 tablet 1  . zolpidem (AMBIEN CR) 12.5 MG CR tablet TAKE 1 TABLET(12.5 MG) BY MOUTH AT BEDTIME AS NEEDED FOR SLEEP 30 tablet 1   No current facility-administered medications for this visit.     Musculoskeletal: Strength & Muscle Tone: UTA Gait & Station: normal Patient leans: N/A  Psychiatric Specialty Exam: Review of Systems  Gastrointestinal: Positive for diarrhea (Improving).  Psychiatric/Behavioral: Positive for decreased concentration and dysphoric mood. The patient is nervous/anxious.   All other systems reviewed and are negative.   There were no vitals taken for this visit.There is no height or weight on file to calculate BMI.  General Appearance: Casual  Eye Contact:  Fair  Speech:  Clear and Coherent  Volume:  Normal  Mood:  Anxious and Depressed  Affect:   Congruent  Thought Process:  Goal Directed and Descriptions of Associations: Intact  Orientation:  Full (Time, Place, and Person)  Thought Content: Logical   Suicidal Thoughts:  No  Homicidal Thoughts:  No  Memory:  Immediate;   Fair Recent;   Fair Remote;   Fair  Judgement:  Fair  Insight:  Fair  Psychomotor Activity:  Normal  Concentration:  Concentration: Fair and Attention Span: Fair  Recall:  [employer] of Knowledge: Good  Language: Fair  Akathisia:  No  Handed:  Right  AIMS (if indicated): UTA  Assets:  Communication Skills Desire for Improvement Housing Intimacy Social Support Talents/Skills Transportation Vocational/Educational  ADL's:  Intact  Cognition: WNL  Sleep:  Fair   Screenings:   Assessment and Plan: Brentyn Seehafer is a 49 year old Caucasian male, married, employed, lives in [place], has a history of MDD, OCD, ADHD, insomnia was evaluated by telemedicine today.  Patient is currently struggling with mood symptoms.  He will benefit from medication readjustment.  He is also struggling with both side effects to some of his medications like Latuda.  Plan as noted below.  Plan MDD-unstable Discontinue Wellbutrin for noncompliance. Continue BuSpar 7.5 mg p.o. twice daily Start Trintellix 10 mg p.o. daily. Discontinue Latuda for side effects. Continue Ambien CR 12.5 mg p.o. nightly for sleep.  OCD-stable Continue CBT.  He has been noncompliant with psychotherapy sessions, patient encouraged to reconnect with therapist. Continue BuSpar as prescribed  ADHD-stable Restart Concerta 18 mg p.o. daily. I have reviewed [place] controlled substance database.  GeneSight testing results where reviewed while making medication changes today.  Follow-up in clinic in 3 to 4 weeks or sooner if needed.  I have spent atleast 30 minutes non face to face with patient today. More than 50 % of the time was spent for preparing to see the patient ( e.g., review of test,  records ), obtaining and to review and separately obtained history , ordering medications and test ,psychoeducation and supportive psychotherapy and care coordination,as well as documenting clinical information in electronic health record. This note was generated in part or whole with voice recognition software. Voice recognition is usually quite accurate but there are transcription errors that can and very often do occur. I apologize for any typographical errors that were not detected and corrected.  Jomarie Longs, MD 06/23/2020, 8:19 AM

## 2020-07-01 ENCOUNTER — Other Ambulatory Visit: Payer: Self-pay | Admitting: Psychiatry

## 2020-07-01 DIAGNOSIS — F331 Major depressive disorder, recurrent, moderate: Secondary | ICD-10-CM

## 2020-07-16 ENCOUNTER — Other Ambulatory Visit: Payer: Self-pay

## 2020-07-16 ENCOUNTER — Telehealth (INDEPENDENT_AMBULATORY_CARE_PROVIDER_SITE_OTHER): Payer: BC Managed Care – PPO | Admitting: Psychiatry

## 2020-07-16 ENCOUNTER — Encounter: Payer: Self-pay | Admitting: Psychiatry

## 2020-07-16 DIAGNOSIS — F900 Attention-deficit hyperactivity disorder, predominantly inattentive type: Secondary | ICD-10-CM

## 2020-07-16 DIAGNOSIS — F5105 Insomnia due to other mental disorder: Secondary | ICD-10-CM | POA: Diagnosis not present

## 2020-07-16 DIAGNOSIS — F429 Obsessive-compulsive disorder, unspecified: Secondary | ICD-10-CM

## 2020-07-16 DIAGNOSIS — F331 Major depressive disorder, recurrent, moderate: Secondary | ICD-10-CM | POA: Diagnosis not present

## 2020-07-16 MED ORDER — METHYLPHENIDATE HCL ER 18 MG PO TB24: 18.0000 mg | ORAL_TABLET | Freq: Every day | ORAL | 0 refills | Status: DC

## 2020-07-16 MED ORDER — BUSPIRONE HCL 7.5 MG PO TABS: ORAL_TABLET | ORAL | 1 refills | Status: DC

## 2020-07-16 MED ORDER — CLONAZEPAM 0.25 MG PO TBDP: 0.2500 mg | ORAL_TABLET | Freq: Two times a day (BID) | ORAL | 0 refills | Status: DC | PRN

## 2020-07-16 MED ORDER — VORTIOXETINE HBR 20 MG PO TABS: 20.0000 mg | ORAL_TABLET | Freq: Every day | ORAL | 1 refills | Status: DC

## 2020-07-16 NOTE — Progress Notes (Signed)
Provider Location : [facility] Patient Location : Home  Participants: Patient , Provider  Virtual Visit via Video Note  I connected with Jeremy Boyer on 07/16/20 at  4:45 PM EDT by a video enabled telemedicine application and verified that I am speaking with the correct person using two identifiers.   I discussed the limitations of evaluation and management by telemedicine and the availability of in person appointments. The patient expressed understanding and agreed to proceed.    I discussed the assessment and treatment plan with the patient. The patient was provided an opportunity to ask questions and all were answered. The patient agreed with the plan and demonstrated an understanding of the instructions.   The patient was advised to call back or seek an in-person evaluation if the symptoms worsen or if the condition fails to improve as anticipated.   BH MD OP Progress Note  07/16/2020 5:09 PM Jeremy Boyer  MRN:  [id number]  Chief Complaint:  Chief Complaint    Follow-up     HPI: Jeremy Boyer is a 49 year old Caucasian male, married, employed, lives in [place], has a history of MDD, OCD, ADHD, hypertension was evaluated by telemedicine today.   Patient today reports he is currently struggling with GI symptoms of nausea, diarrhea.  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  He is currently on Bentyl as well as omeprazole.  He also is waiting for gastroenterology consultation.  He reports due to his current GI symptoms he is having a lot of trouble functioning during the day.  He reports he feels it exhausting.  He was told by his primary care provider that he likely has IBS.  His symptoms started getting worse in September.  He reports he had mild symptoms in August.  He reports he has not had anything like that previously.  Patient reports he is not sure if he is rightly taking all his medications since he is having  all these GI symptoms.  Unsure if these medications are getting absorbed.  He does struggle with his mood as well.  He currently struggles with sadness, anxiety symptoms.  Patient denies any suicidality, homicidality or perceptual disturbances.  He has not had psychotherapy sessions in a while.  He however reports he will reach out to his therapist to restart therapy sessions.  Patient denies any other concerns today.   Visit Diagnosis:    ICD-10-CM   1. MDD (major depressive disorder), recurrent episode, moderate (HCC)  F33.1 vortioxetine HBr (TRINTELLIX) 20 MG TABS tablet    clonazePAM (KLONOPIN) 0.25 MG disintegrating tablet    busPIRone (BUSPAR) 7.5 MG tablet  2. 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 Attention deficit hyperactivity disorder (ADHD), predominantly inattentive type  F90.0 methylphenidate 18 MG PO CR tablet  4. Insomnia due to mental condition  F51.05     Past Psychiatric History: I have reviewed past psychiatric history from my progress note on 10/23/2018.  Past trials of Paxil, Abilify, Trintellix, Depakote, mirtazapine, risperidone, duloxetine, Seroquel  Past Medical History:  Past Medical History:  Diagnosis Date  . Anxiety and depression 12/01/2015  . Bipolar disorder (HCC)   . Cholecystitis   . Hypertension 07/05/2017  . Insomnia 07/05/2017  . MDD (major depressive disorder), recurrent episode, moderate (HCC) 01/13/2016  . OCD (  obsessive compulsive disorder) 01/13/2016    Past Surgical History:  Procedure Laterality Date  . CHOLECYSTECTOMY N/A 07/10/2017   Procedure: LAPAROSCOPIC CHOLECYSTECTOMY;  Surgeon: Ancil Linsey, MD;  Location: [facility] ORS;  Service: General;  Laterality: N/A;  . VASECTOMY    . VASECTOMY REVERSAL      Family Psychiatric History: I have reviewed family psychiatric  history from my progress note on 10/23/2018  Family History:  Family History  Problem Relation Age of Onset  . Anxiety disorder Mother   . Depression Mother   . Alcohol abuse Father   . Anxiety disorder Sister   . Depression Sister     Social History: Reviewed social history from my progress note from 10/23/2018 Social History   Socioeconomic History  . Marital status: Married    Spouse name: nancy  . Number of children: 3  . Years of education: Not on file  . Highest education level: Bachelor's degree (e.g., BA, AB, BS)  Occupational History  . Not on file  Tobacco Use  . Smoking status: Never Smoker  . Smokeless tobacco: Never Used  Vaping Use  . Vaping Use: Never used  Substance and Sexual Activity  . Alcohol use: Yes    Alcohol/week: 23.0 standard drinks    Types: 2 Glasses of wine, 14 Cans of beer, 7 Shots of liquor per week  . Drug use: No  . Sexual activity: Yes    Partners: Female    Birth control/protection: None  Other Topics Concern  . Not on file  Social History Narrative  . Not on file   Social Determinants of Health   Financial Resource Strain:   . Difficulty of Paying Living Expenses: Not on file  Food Insecurity:   . Worried About Programme researcher, broadcasting/film/video in the Last Year: Not on file  . Ran Out of Food in the Last Year: Not on file  Transportation Needs:   . Lack of Transportation (Medical): Not on file  . Lack of Transportation (Non-Medical): Not on file  Physical Activity:   . Days of Exercise per Week: Not on file  . Minutes of Exercise per Session: Not on file  Stress:   . Feeling of Stress : Not on file  Social Connections:   . Frequency of Communication with Friends and Family: Not on file  . Frequency of Social Gatherings with Friends and Family: Not on file  . Attends Religious Services: Not on file  . Active Member of Clubs or Organizations: Not on file  . Attends Banker Meetings: Not on file  . Marital Status: Not on file     Allergies: No Known Allergies  Metabolic Disorder Labs: No results found for: HGBA1C, MPG No results found for: PROLACTIN No results found for: CHOL, TRIG, HDL, CHOLHDL, VLDL, LDLCALC Lab Results  Component Value Date   TSH 3.980 10/23/2018    Therapeutic Level Labs: No results found for: LITHIUM No results found for: VALPROATE No components found for:  CBMZ  Current Medications: Current Outpatient Medications  Medication Sig Dispense Refill  . dicyclomine (BENTYL) 20 MG tablet Can take every six hours as needed for abdominal cramping    . omeprazole (PRILOSEC) 20 MG capsule Take by mouth.    Marland Kitchen amoxicillin-clavulanate (AUGMENTIN) 875-125 MG tablet Take 1 tablet by mouth 2 (two) times daily.    . bisoprolol-hydrochlorothiazide (ZIAC) 10-6.25 MG tablet Take 1 tablet by mouth daily.    . bisoprolol-hydrochlorothiazide (ZIAC) 10-6.25 MG tablet Take by mouth.    Marland Kitchen  busPIRone (BUSPAR) 7.5 MG tablet TAKE 1 TABLET(7.5 MG) BY MOUTH TWICE DAILY 60 tablet 1  . clonazePAM (KLONOPIN) 0.25 MG disintegrating tablet Take 1 tablet (0.25 mg total) by mouth 2 (two) times daily as needed. For anxiety attacks 60 tablet 0  . cyclobenzaprine (FLEXERIL) 10 MG tablet Take 10 mg by mouth 3 (three) times daily as needed for muscle spasms.     Marland Kitchen dicyclomine (BENTYL) 20 MG tablet Take 20 mg by mouth every 6 (six) hours as needed.    . methylphenidate 18 MG PO CR tablet Take 1 tablet (18 mg total) by mouth every morning. 30 tablet 0  . methylphenidate 18 MG PO CR tablet Take 1 tablet (18 mg total) by mouth daily. 30 tablet 0  . omeprazole (PRILOSEC) 20 MG capsule Take 20 mg by mouth daily.    . rosuvastatin (CRESTOR) 10 MG tablet Take 10 mg by mouth at bedtime.    . valACYclovir (VALTREX) 500 MG tablet TAKE 1 TABLET BY MOUTH ONCE DAILY    . vortioxetine HBr (TRINTELLIX) 20 MG TABS tablet Take 1 tablet (20 mg total) by mouth daily. 30 tablet 1  . zolpidem (AMBIEN CR) 12.5 MG CR tablet TAKE 1 TABLET(12.5 MG)  BY MOUTH AT BEDTIME AS NEEDED FOR SLEEP 30 tablet 1   No current facility-administered medications for this visit.     Musculoskeletal: Strength & Muscle Tone: UTA Gait & Station: normal Patient leans: N/A  Psychiatric Specialty Exam: Review of Systems  Gastrointestinal: Positive for diarrhea and nausea.  Psychiatric/Behavioral: Positive for dysphoric mood. The patient is nervous/anxious.   All other systems reviewed and are negative.   There were no vitals taken for this visit.There is no height or weight on file to calculate BMI.  General Appearance: Casual  Eye Contact:  Fair  Speech:  Clear and Coherent  Volume:  Normal  Mood:  Anxious, Depressed and Dysphoric  Affect:  Congruent  Thought Process:  Goal Directed and Descriptions of Associations: Intact  Orientation:  Full (Time, Place, and Person)  Thought Content: Logical   Suicidal Thoughts:  No  Homicidal Thoughts:  No  Memory:  Immediate;   Fair Recent;   Fair Remote;   Fair  Judgement:  Fair  Insight:  Fair  Psychomotor Activity:  Normal  Concentration:  Concentration: Fair and Attention Span: Fair  Recall:  [employer] of Knowledge: Fair  Language: Fair  Akathisia:  No  Handed:  Right  AIMS (if indicated): UTA  Assets:  Communication Skills Desire for Improvement Housing Social Support  ADL's:  Intact  Cognition: WNL  Sleep:  Fair   Screenings:   Assessment and Plan: Jeremy Boyer is a 49 year old Caucasian male, married, employed, lives in [place], has a history of MDD, OCD, ADHD, insomnia was evaluated by telemedicine today.  Patient currently struggles with GI symptoms, has upcoming gastroenterology consultation, continues to struggle with anxiety and mood symptoms.  He will benefit from medication readjustment and psychotherapy sessions.  Plan as noted below.  Plan MDD-unstable BuSpar 7.5 mg p.o. twice daily Increase Trintellix to 20 mg p.o. daily Ambien CR 12.5 mg p.o. nightly for  sleep Start Klonopin 0.25 mg p.o. twice daily as needed for anxiety. Discontinue Xanax.  OCD-stable Continue CBT BuSpar as prescribed  ADHD-stable Concerta 18 mg p.o. daily I have reviewed [place] controlled substance database.  GeneSight testing results were reviewed while making medication changes today.  I have reached out to Mr. Anda Kraft therapist.  Patient to  continue psychotherapy sessions.  Follow-up in clinic in 3 to 4 weeks or sooner if needed.   I have spent atleast 20 minutes face to face by video with patient today. More than 50 % of the time was spent for preparing to see the patient ( e.g., review of test, records ), ordering medications and test ,psychoeducation and supportive psychotherapy and care coordination,as well as documenting clinical information in electronic health record. This note was generated in part or whole with voice recognition software. Voice recognition is usually quite accurate but there are transcription errors that can and very often do occur. I apologize for any typographical errors that were not detected and corrected.     Jomarie LongsSaramma Henson Fraticelli, MD 07/17/2020, 8:28 AM

## 2020-07-17 ENCOUNTER — Telehealth (HOSPITAL_COMMUNITY): Payer: Self-pay | Admitting: Psychiatry

## 2020-07-17 ENCOUNTER — Telehealth: Payer: BC Managed Care – PPO | Admitting: Psychiatry

## 2020-07-17 NOTE — Telephone Encounter (Signed)
Called to schedule therapy appt, left detailed message requesting patient return call to schedule f/u with therapist.

## 2020-08-03 ENCOUNTER — Other Ambulatory Visit: Payer: Self-pay | Admitting: Psychiatry

## 2020-08-03 DIAGNOSIS — F429 Obsessive-compulsive disorder, unspecified: Secondary | ICD-10-CM

## 2020-08-03 DIAGNOSIS — F331 Major depressive disorder, recurrent, moderate: Secondary | ICD-10-CM

## 2020-08-20 ENCOUNTER — Other Ambulatory Visit: Payer: Self-pay

## 2020-08-20 ENCOUNTER — Telehealth (INDEPENDENT_AMBULATORY_CARE_PROVIDER_SITE_OTHER): Payer: BC Managed Care – PPO | Admitting: Psychiatry

## 2020-08-20 ENCOUNTER — Encounter: Payer: Self-pay | Admitting: Psychiatry

## 2020-08-20 DIAGNOSIS — F900 Attention-deficit hyperactivity disorder, predominantly inattentive type: Secondary | ICD-10-CM | POA: Diagnosis not present

## 2020-08-20 DIAGNOSIS — F429 Obsessive-compulsive disorder, unspecified: Secondary | ICD-10-CM | POA: Diagnosis not present

## 2020-08-20 DIAGNOSIS — F331 Major depressive disorder, recurrent, moderate: Secondary | ICD-10-CM

## 2020-08-20 DIAGNOSIS — F5105 Insomnia due to other mental disorder: Secondary | ICD-10-CM | POA: Diagnosis not present

## 2020-08-20 MED ORDER — ZOLPIDEM TARTRATE ER 12.5 MG PO TBCR: EXTENDED_RELEASE_TABLET | ORAL | 1 refills | Status: DC

## 2020-08-20 MED ORDER — VORTIOXETINE HBR 20 MG PO TABS: 20.0000 mg | ORAL_TABLET | Freq: Every day | ORAL | 1 refills | Status: DC

## 2020-08-20 MED ORDER — METHYLPHENIDATE HCL ER (OSM) 18 MG PO TBCR: 18.0000 mg | EXTENDED_RELEASE_TABLET | Freq: Every morning | ORAL | 0 refills | Status: DC

## 2020-08-20 MED ORDER — CLONAZEPAM 0.25 MG PO TBDP: 0.2500 mg | ORAL_TABLET | Freq: Two times a day (BID) | ORAL | 1 refills | Status: DC | PRN

## 2020-08-20 MED ORDER — METHYLPHENIDATE HCL ER 18 MG PO TB24: 18.0000 mg | ORAL_TABLET | Freq: Every day | ORAL | 0 refills | Status: DC

## 2020-08-20 NOTE — Progress Notes (Signed)
Virtual Visit via Video Note  I connected with Jeremy Boyer on 08/20/20 at  4:00 PM EST by a video enabled telemedicine application and verified that I am speaking with the correct person using two identifiers.  Location Provider Location : [facility] Patient Location : Car  Participants: Patient , Provider   I discussed the limitations of evaluation and management by telemedicine and the availability of in person appointments. The patient expressed understanding and agreed to proceed.  I discussed the assessment and treatment plan with the patient. The patient was provided an opportunity to ask questions and all were answered. The patient agreed with the plan and demonstrated an understanding of the instructions.  The patient was advised to call back or seek an in-person evaluation if the symptoms worsen or if the condition fails to improve as anticipated.  BH MD OP Progress Note  08/20/2020 4:23 PM Jeremy Boyer  MRN:  [id number]  Chief Complaint:  Chief Complaint    Follow-up     HPI: Jeremy Boyer is a 49 year old Caucasian male, married, employed, lives in [place], has a history of MDD, OCD, ADHD, hypertension was evaluated by telemedicine today.  Patient today reports since making the changes with his medication he has been feeling much better.  He reports the clonazepam does help him to feel more relaxed.  He is not as anxious as he used to before.  He reports that has definitely made a big difference in his functioning during the day.  He has been taking 1 tablet of Klonopin since the past few days.  Initially he took 2 tablets/day.  He reports since the past 2 weeks his GI symptoms have resolved.  He does not have diarrhea or abdominal cramps at this time.   Patient denies any sleep problems.  He continues to be compliant on the Ambien as prescribed.  He is compliant on all his medications and denies side effects.  Patient denies any suicidality,  homicidality or perceptual disturbances.  Patient denies any other concerns today.  Visit Diagnosis:    ICD-10-CM   1. MDD (major depressive disorder), recurrent episode, moderate (HCC)  F33.1 clonazePAM (KLONOPIN) 0.25 MG disintegrating tablet    vortioxetine HBr (TRINTELLIX) 20 MG TABS tablet    zolpidem (AMBIEN CR) 12.5 MG CR tablet  2. 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 Attention deficit hyperactivity disorder (ADHD), predominantly inattentive type  F90.0 methylphenidate 18 MG PO CR tablet  4. Insomnia due to mental condition  F51.05     Past Psychiatric History: I have reviewed past psychiatric history from my progress note on 10/23/2018.  Past trials of Paxil, Abilify, Trintellix, Depakote, mirtazapine, risperidone, duloxetine, Seroquel  Past Medical History:  Past Medical History:  Diagnosis Date  . Anxiety and depression 12/01/2015  . Bipolar disorder (HCC)   . Cholecystitis   . Hypertension 07/05/2017  . Insomnia 07/05/2017  . MDD (major depressive disorder), recurrent episode, moderate (HCC) 01/13/2016  . OCD (obsessive compulsive disorder) 01/13/2016    Past Surgical History:  Procedure Laterality Date  . CHOLECYSTECTOMY N/A 07/10/2017   Procedure: LAPAROSCOPIC CHOLECYSTECTOMY;  Surgeon: Ancil Linsey, MD;  Location: [facility] ORS;  Service: General;  Laterality: N/A;  . VASECTOMY    . VASECTOMY REVERSAL      Family Psychiatric History: I have reviewed family psychiatric history from my progress note on 10/23/2018  Family History:  Family History  Problem Relation Age of Onset  .  Anxiety disorder Mother   . Depression Mother   . Alcohol abuse Father   . Anxiety disorder Sister   . Depression Sister     Social History: Reviewed social history from my progress note on 10/23/2018 Social History   Socioeconomic History  . Marital status: Married    Spouse name: nancy  . Number of children: 3  . Years  of education: Not on file  . Highest education level: Bachelor's degree (e.g., BA, AB, BS)  Occupational History  . Not on file  Tobacco Use  . Smoking status: Never Smoker  . Smokeless tobacco: Never Used  Vaping Use  . Vaping Use: Never used  Substance and Sexual Activity  . Alcohol use: Yes    Alcohol/week: 23.0 standard drinks    Types: 2 Glasses of wine, 14 Cans of beer, 7 Shots of liquor per week  . Drug use: No  . Sexual activity: Yes    Partners: Female    Birth control/protection: None  Other Topics Concern  . Not on file  Social History Narrative  . Not on file   Social Determinants of Health   Financial Resource Strain:   . Difficulty of Paying Living Expenses: Not on file  Food Insecurity:   . Worried About Programme researcher, broadcasting/film/video in the Last Year: Not on file  . Ran Out of Food in the Last Year: Not on file  Transportation Needs:   . Lack of Transportation (Medical): Not on file  . Lack of Transportation (Non-Medical): Not on file  Physical Activity:   . Days of Exercise per Week: Not on file  . Minutes of Exercise per Session: Not on file  Stress:   . Feeling of Stress : Not on file  Social Connections:   . Frequency of Communication with Friends and Family: Not on file  . Frequency of Social Gatherings with Friends and Family: Not on file  . Attends Religious Services: Not on file  . Active Member of Clubs or Organizations: Not on file  . Attends Banker Meetings: Not on file  . Marital Status: Not on file    Allergies: No Known Allergies  Metabolic Disorder Labs: No results found for: HGBA1C, MPG No results found for: PROLACTIN No results found for: CHOL, TRIG, HDL, CHOLHDL, VLDL, LDLCALC Lab Results  Component Value Date   TSH 3.980 10/23/2018    Therapeutic Level Labs: No results found for: LITHIUM No results found for: VALPROATE No components found for:  CBMZ  Current Medications: Current Outpatient Medications  Medication  Sig Dispense Refill  . amoxicillin-clavulanate (AUGMENTIN) 875-125 MG tablet Take 1 tablet by mouth 2 (two) times daily.    . bisoprolol-hydrochlorothiazide (ZIAC) 10-6.25 MG tablet Take 1 tablet by mouth daily.    . bisoprolol-hydrochlorothiazide (ZIAC) 10-6.25 MG tablet Take by mouth.    . busPIRone (BUSPAR) 7.5 MG tablet TAKE 1 TABLET(7.5 MG) BY MOUTH TWICE DAILY 60 tablet 1  . clonazePAM (KLONOPIN) 0.25 MG disintegrating tablet Take 1 tablet (0.25 mg total) by mouth 2 (two) times daily as needed. For anxiety attacks 60 tablet 1  . cyclobenzaprine (FLEXERIL) 10 MG tablet Take 10 mg by mouth 3 (three) times daily as needed for muscle spasms.     Marland Kitchen dicyclomine (BENTYL) 20 MG tablet Take 20 mg by mouth every 6 (six) hours as needed.    . dicyclomine (BENTYL) 20 MG tablet Can take every six hours as needed for abdominal cramping    . methylphenidate  18 MG PO CR tablet Take 1 tablet (18 mg total) by mouth every morning. 30 tablet 0  . [START ON 09/18/2020] methylphenidate 18 MG PO CR tablet Take 1 tablet (18 mg total) by mouth daily. 30 tablet 0  . omeprazole (PRILOSEC) 20 MG capsule Take 20 mg by mouth daily.    Marland Kitchen. omeprazole (PRILOSEC) 20 MG capsule Take by mouth.    . rosuvastatin (CRESTOR) 10 MG tablet Take 10 mg by mouth at bedtime.    . valACYclovir (VALTREX) 500 MG tablet TAKE 1 TABLET BY MOUTH ONCE DAILY    . vortioxetine HBr (TRINTELLIX) 20 MG TABS tablet Take 1 tablet (20 mg total) by mouth daily. 90 tablet 1  . zolpidem (AMBIEN CR) 12.5 MG CR tablet Take 1 tablet at bedtime 30 tablet 1   No current facility-administered medications for this visit.     Musculoskeletal: Strength & Muscle Tone: UTA Gait & Station: normal Patient leans: N/A  Psychiatric Specialty Exam: Review of Systems  Psychiatric/Behavioral: The patient is nervous/anxious.   All other systems reviewed and are negative.   There were no vitals taken for this visit.There is no height or weight on file to calculate  BMI.  General Appearance: Casual  Eye Contact:  Fair  Speech:  Normal Rate  Volume:  Normal  Mood:  Anxious improving  Affect:  Congruent  Thought Process:  Goal Directed and Descriptions of Associations: Intact  Orientation:  Full (Time, Place, and Person)  Thought Content: Logical   Suicidal Thoughts:  No  Homicidal Thoughts:  No  Memory:  Immediate;   Fair Recent;   Fair Remote;   Fair  Judgement:  Fair  Insight:  Fair  Psychomotor Activity:  Normal  Concentration:  Concentration: Fair and Attention Span: Fair  Recall:  [employer]  Fund of Knowledge: Fair  Language: Fair  Akathisia:  No  Handed:  Right  AIMS (if indicated): UTA  Assets:  Communication Skills Desire for Improvement Housing Intimacy Social Support Talents/Skills Transportation Vocational/Educational  ADL's:  Intact  Cognition: WNL  Sleep:  Fair   Screenings:   Assessment and Plan: Jeremy OvermanCullen Grant Boyer is a 49 year old Caucasian male, married, employed, lives in [place], has a history of MDD, OCD, ADHD, insomnia was evaluated by telemedicine today.  Patient is currently making progress with regards to his anxiety as well as his GI symptoms have resolved.  Discussed plan as noted below.  Plan MDD-improving BuSpar 7.5 mg p.o. twice daily Trintellix 20 mg p.o. daily Ambien CR 12.5 mg p.o. nightly for sleep Klonopin 0.25 mg p.o. twice daily as needed for anxiety.  Patient advised to limit use.  He currently takes it once a day as needed only.   OCD-stable Continue CBT BuSpar as prescribed  ADHD-stable Concerta 18 mg p.o. daily I have provided 2 prescriptions, last one to be filled on or after 09/18/2020. I have reviewed [place] controlled substance database.  GeneSight testing results were reviewed by making medication changes today.  Follow-up in clinic in 4 weeks or sooner if needed.  I have spent atleast 20 minutes face to face by video with patient today. More than 50 % of the time was spent for  preparing to see the patient ( e.g., review of test, records ), ordering medications and test ,psychoeducation and supportive psychotherapy and care coordination,as well as documenting clinical information in electronic health record. This note was generated in part or whole with voice recognition software. Voice recognition is usually quite accurate but there  are transcription errors that can and very often do occur. I apologize for any typographical errors that were not detected and corrected.      Jomarie Longs, MD 08/21/2020, 8:30 AM

## 2020-09-20 ENCOUNTER — Other Ambulatory Visit: Payer: Self-pay | Admitting: Psychiatry

## 2020-09-20 DIAGNOSIS — F331 Major depressive disorder, recurrent, moderate: Secondary | ICD-10-CM

## 2020-09-21 ENCOUNTER — Telehealth (INDEPENDENT_AMBULATORY_CARE_PROVIDER_SITE_OTHER): Payer: BC Managed Care – PPO | Admitting: Psychiatry

## 2020-09-21 ENCOUNTER — Other Ambulatory Visit: Payer: Self-pay

## 2020-09-21 ENCOUNTER — Encounter: Payer: Self-pay | Admitting: Psychiatry

## 2020-09-21 DIAGNOSIS — F3342 Major depressive disorder, recurrent, in full remission: Secondary | ICD-10-CM

## 2020-09-21 DIAGNOSIS — F429 Obsessive-compulsive disorder, unspecified: Secondary | ICD-10-CM | POA: Diagnosis not present

## 2020-09-21 DIAGNOSIS — F5105 Insomnia due to other mental disorder: Secondary | ICD-10-CM

## 2020-09-21 DIAGNOSIS — F900 Attention-deficit hyperactivity disorder, predominantly inattentive type: Secondary | ICD-10-CM | POA: Diagnosis not present

## 2020-09-21 MED ORDER — BUSPIRONE HCL 7.5 MG PO TABS: ORAL_TABLET | ORAL | 1 refills | Status: DC

## 2020-09-21 NOTE — Progress Notes (Signed)
Virtual Visit via Video Note  I connected with Jeremy Boyer on 09/21/20 at 11:40 AM EST by a video enabled telemedicine application and verified that I am speaking with the correct person using two identifiers.  Location Provider Location : [facility] Patient Location : Home  Participants: Patient , Provider   I discussed the limitations of evaluation and management by telemedicine and the availability of in person appointments. The patient expressed understanding and agreed to proceed.   I discussed the assessment and treatment plan with the patient. The patient was provided an opportunity to ask questions and all were answered. The patient agreed with the plan and demonstrated an understanding of the instructions.   The patient was advised to call back or seek an in-person evaluation if the symptoms worsen or if the condition fails to improve as anticipated.   BH MD OP Progress Note  09/21/2020 1:29 PM Jeremy Boyer  MRN:  [id number]  Chief Complaint:  Chief Complaint    Follow-up     HPI: Jeremy Boyer is a 49 year old Caucasian male, married, employed, lives in [place], has a history of MDD, OCD, ADHD, hypertension was evaluated by telemedicine today.  Patient today reports overall his anxiety symptoms are more under control.  He denies any depressive symptoms.  He reports sleep and appetite as fair.  He reports his concentration is good on the Concerta.  He has not had any significant GI symptoms recently.  He reports since his last appointment with writer he may have had couple of episodes of diarrhea however that resolved.  Patient denies any suicidality, homicidality or perceptual disturbances.  Patient denies any other concerns today.  Visit Diagnosis:    ICD-10-CM   1. MDD (major depressive disorder), recurrent, in full remission (HCC)  F33.42 busPIRone (BUSPAR) 7.5 MG tablet  2. Obsessive-compulsive disorder with good or fair insight  F42.9  busPIRone (BUSPAR) 7.5 MG tablet  3. Attention deficit hyperactivity disorder (ADHD), predominantly inattentive type  F90.0   4. Insomnia due to mental condition  F51.05     Past Psychiatric History: I have reviewed past psychiatric history from my progress note from 10/23/2018.  Past trials of Paxil, Abilify, Trintellix, Depakote, mirtazapine, risperidone, duloxetine, Seroquel  Past Medical History:  Past Medical History:  Diagnosis Date  . Anxiety and depression 12/01/2015  . Bipolar disorder (HCC)   . Cholecystitis   . Hypertension 07/05/2017  . Insomnia 07/05/2017  . MDD (major depressive disorder), recurrent episode, moderate (HCC) 01/13/2016  . OCD (obsessive compulsive disorder) 01/13/2016    Past Surgical History:  Procedure Laterality Date  . CHOLECYSTECTOMY N/A 07/10/2017   Procedure: LAPAROSCOPIC CHOLECYSTECTOMY;  Surgeon: Ancil Linsey, MD;  Location: [facility]S;  Service: General;  Laterality: N/A;  . VASECTOMY    . VASECTOMY REVERSAL      Family Psychiatric History: I have reviewed family psychiatric history from my progress note on 10/23/2018  Family History:  Family History  Problem Relation Age of Onset  . Anxiety disorder Mother   . Depression Mother   . Alcohol abuse Father   . Anxiety disorder Sister   . Depression Sister     Social History: I have reviewed social history from my progress note from 10/23/2018 Social History   Socioeconomic History  . Marital status: Married    Spouse name: nancy  . Number of children: 3  . Years of education: Not on file  . Highest education level: Bachelor's degree (e.g., BA, AB, BS)  Occupational  History  . Not on file  Tobacco Use  . Smoking status: Never Smoker  . Smokeless tobacco: Never Used  Vaping Use  . Vaping Use: Never used  Substance and Sexual Activity  . Alcohol use: Yes    Alcohol/week: 23.0 standard drinks    Types: 2 Glasses of wine, 14 Cans of beer, 7 Shots of liquor per week  . Drug use: No  .  Sexual activity: Yes    Partners: Female    Birth control/protection: None  Other Topics Concern  . Not on file  Social History Narrative  . Not on file   Social Determinants of Health   Financial Resource Strain: Not on file  Food Insecurity: Not on file  Transportation Needs: Not on file  Physical Activity: Not on file  Stress: Not on file  Social Connections: Not on file    Allergies: No Known Allergies  Metabolic Disorder Labs: No results found for: HGBA1C, MPG No results found for: PROLACTIN No results found for: CHOL, TRIG, HDL, CHOLHDL, VLDL, LDLCALC Lab Results  Component Value Date   TSH 3.980 10/23/2018    Therapeutic Level Labs: No results found for: LITHIUM No results found for: VALPROATE No components found for:  CBMZ  Current Medications: Current Outpatient Medications  Medication Sig Dispense Refill  . amoxicillin-clavulanate (AUGMENTIN) 875-125 MG tablet Take 1 tablet by mouth 2 (two) times daily.    . bisoprolol-hydrochlorothiazide (ZIAC) 10-6.25 MG tablet Take 1 tablet by mouth daily.    . bisoprolol-hydrochlorothiazide (ZIAC) 10-6.25 MG tablet Take by mouth.    . busPIRone (BUSPAR) 7.5 MG tablet Take by mouth 1 tablet twice daily 60 tablet 1  . clonazePAM (KLONOPIN) 0.25 MG disintegrating tablet Take 1 tablet (0.25 mg total) by mouth 2 (two) times daily as needed. For anxiety attacks 60 tablet 1  . cyclobenzaprine (FLEXERIL) 10 MG tablet Take 10 mg by mouth 3 (three) times daily as needed for muscle spasms.     Marland Kitchen dicyclomine (BENTYL) 20 MG tablet Take 20 mg by mouth every 6 (six) hours as needed.    . dicyclomine (BENTYL) 20 MG tablet Can take every six hours as needed for abdominal cramping    . methylphenidate 18 MG PO CR tablet Take 1 tablet (18 mg total) by mouth every morning. 30 tablet 0  . methylphenidate 18 MG PO CR tablet Take 1 tablet (18 mg total) by mouth daily. 30 tablet 0  . omeprazole (PRILOSEC) 20 MG capsule Take 20 mg by mouth daily.     Marland Kitchen omeprazole (PRILOSEC) 20 MG capsule Take by mouth.    . rosuvastatin (CRESTOR) 10 MG tablet Take 10 mg by mouth at bedtime.    . valACYclovir (VALTREX) 500 MG tablet TAKE 1 TABLET BY MOUTH ONCE DAILY    . vortioxetine HBr (TRINTELLIX) 20 MG TABS tablet Take 1 tablet (20 mg total) by mouth daily. 90 tablet 1  . zolpidem (AMBIEN CR) 12.5 MG CR tablet Take 1 tablet at bedtime 30 tablet 1   No current facility-administered medications for this visit.     Musculoskeletal: Strength & Muscle Tone: UTA Gait & Station: UTA Patient leans: N/A  Psychiatric Specialty Exam: Review of Systems  Psychiatric/Behavioral: The patient is nervous/anxious.   All other systems reviewed and are negative.   There were no vitals taken for this visit.There is no height or weight on file to calculate BMI.  General Appearance: Casual  Eye Contact:  Fair  Speech:  Clear and Coherent  Volume:  Normal  Mood:  Anxious coping ok  Affect:  Congruent  Thought Process:  Goal Directed and Descriptions of Associations: Intact  Orientation:  Full (Time, Place, and Person)  Thought Content: Logical   Suicidal Thoughts:  No  Homicidal Thoughts:  No  Memory:  Immediate;   Fair Recent;   Fair Remote;   Fair  Judgement:  Fair  Insight:  Fair  Psychomotor Activity:  Normal  Concentration:  Concentration: Fair and Attention Span: Fair  Recall:  [employer] of Knowledge: Fair  Language: Fair  Akathisia:  No  Handed:  Right  AIMS (if indicated): UTA  Assets:  Communication Skills Desire for Improvement Housing Social Support  ADL's:  Intact  Cognition: WNL  Sleep:  Fair   Screenings: GAD-7   Flowsheet Row Video Visit [facility]/21/2020 in [facility]  Total GAD-7 Score 5       Assessment and Plan: Jeremy Boyer is a 49 year old Caucasian male, employed, lives in [place], has a history of MDD, OCD, ADHD, insomnia was evaluated by telemedicine today.  Patient is  currently compliant on medications and reports his mood symptoms as stable.  Plan as noted below.  Plan MDD-in remission BuSpar 7.5 mg p.o. twice daily Trintellix 20 mg p.o. daily Ambien CR 12.5 mg p.o. nightly for sleep Klonopin 0.25 mg p.o. twice daily as needed for severe anxiety symptoms only.  He uses it every 2 days or so.  He has been limiting use.  OCD-stable Continue CBT BuSpar as prescribed  ADHD-stable Concerta 18 mg p.o. daily. I have reviewed [place] controlled substance database. Patient has 1 prescription pending at the pharmacy.  GeneSight testing results were reviewed while making medication changes.  Follow-up in clinic in 6 weeks or sooner if needed.   I have spent atleast 20 minutes face to face by video with patient today. More than 50 % of the time was spent for preparing to see the patient ( e.g., review of test, records ), ordering medications and test ,psychoeducation and supportive psychotherapy and care coordination,as well as documenting clinical information in electronic health record. This note was generated in part or whole with voice recognition software. Voice recognition is usually quite accurate but there are transcription errors that can and very often do occur. I apologize for any typographical errors that were not detected and corrected.     Jeremy Longs, MD 09/21/2020, 1:29 PM

## 2020-11-02 ENCOUNTER — Telehealth (INDEPENDENT_AMBULATORY_CARE_PROVIDER_SITE_OTHER): Payer: BC Managed Care – PPO | Admitting: Psychiatry

## 2020-11-02 ENCOUNTER — Encounter: Payer: Self-pay | Admitting: Psychiatry

## 2020-11-02 ENCOUNTER — Other Ambulatory Visit: Payer: Self-pay

## 2020-11-02 DIAGNOSIS — F429 Obsessive-compulsive disorder, unspecified: Secondary | ICD-10-CM

## 2020-11-02 DIAGNOSIS — F3342 Major depressive disorder, recurrent, in full remission: Secondary | ICD-10-CM

## 2020-11-02 DIAGNOSIS — F5105 Insomnia due to other mental disorder: Secondary | ICD-10-CM

## 2020-11-02 DIAGNOSIS — F900 Attention-deficit hyperactivity disorder, predominantly inattentive type: Secondary | ICD-10-CM

## 2020-11-02 MED ORDER — BUSPIRONE HCL 7.5 MG PO TABS: ORAL_TABLET | ORAL | 1 refills | Status: DC

## 2020-11-02 MED ORDER — ZOLPIDEM TARTRATE ER 12.5 MG PO TBCR: EXTENDED_RELEASE_TABLET | ORAL | 1 refills | Status: DC

## 2020-11-02 NOTE — Progress Notes (Unsigned)
Virtual Visit via Video Note  I connected with Jeremy Boyer on 11/02/20 at  4:40 PM EST by a video enabled telemedicine application and verified that I am speaking with the correct person using two identifiers.  Location Provider Location : [facility] Patient Location : Car  Participants: Patient , Provider   I discussed the limitations of evaluation and management by telemedicine and the availability of in person appointments. The patient expressed understanding and agreed to proceed.   I discussed the assessment and treatment plan with the patient. The patient was provided an opportunity to ask questions and all were answered. The patient agreed with the plan and demonstrated an understanding of the instructions.   The patient was advised to call back or seek an in-person evaluation if the symptoms worsen or if the condition fails to improve as anticipated.  BH MD OP Progress Note  11/02/2020 4:55 PM Demyan Fugate  MRN:  [id number]  Chief Complaint:  Chief Complaint    Follow-up     HPI: Jeremy Boyer is a 50 year old Caucasian male, married, employed, lives in [place], has a history of MDD, OCD, ADHD, hypertension was evaluated by telemedicine today.  Patient today reports he is currently doing well.  Denies any significant anxiety symptoms.  He has had a few panic attacks however he was able to cope with it and it does not last too long anymore.  He has not been using the clonazepam much.  He is compliant on all his medications and denies side effects.  Patient reports sleep is overall okay.  Patient reports work is going well.  Work is busy since it is the beginning of the new semester.  Patient however reports with the snow storm there were a few breaks and that was good.  He reports his focus and attention is good on the Concerta.  Patient denies any suicidality, homicidality or perceptual disturbances.  Patient denies any other concerns  today.    Visit Diagnosis:    ICD-10-CM   1. MDD (major depressive disorder), recurrent, in full remission (HCC)  F33.42 busPIRone (BUSPAR) 7.5 MG tablet  2. 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 Attention deficit hyperactivity disorder (ADHD), predominantly inattentive type  F90.0   4. Insomnia due to mental disorder  F51.05 zolpidem (AMBIEN CR) 12.5 MG CR tablet   anxiety, depression    Past Psychiatric History: I have reviewed past psychiatric history from my progress note on 10/23/2018.  Past trials of Paxil, Abilify, Trintellix, Depakote, mirtazapine, risperidone, duloxetine, Seroquel  Past Medical History:  Past Medical History:  Diagnosis Date  . Anxiety and depression 12/01/2015  . Bipolar disorder (HCC)   . Cholecystitis   . Hypertension 07/05/2017  . Insomnia 07/05/2017  . MDD (major depressive disorder), recurrent episode, moderate (HCC) 01/13/2016  . OCD (obsessive compulsive disorder) 01/13/2016    Past Surgical History:  Procedure Laterality Date  . CHOLECYSTECTOMY N/A 07/10/2017   Procedure: LAPAROSCOPIC CHOLECYSTECTOMY;  Surgeon: Ancil Linsey, MD;  Location: [facility] ORS;  Service: General;  Laterality: N/A;  . VASECTOMY    . VASECTOMY REVERSAL      Family Psychiatric History: I have reviewed family psychiatric history from my progress note on 10/23/2018  Family History:  Family History  Problem Relation Age of Onset  . Anxiety disorder Mother   . Depression Mother   . Alcohol abuse Father   . Anxiety disorder Sister   . Depression Sister  Social History: Reviewed social history from my progress note on 10/23/2018 Social History   Socioeconomic History  . Marital status: Married    Spouse name: Jeremy Boyer  . Number of children: 3  . Years of education: Not on file  . Highest education level: Bachelor's degree (e.g., BA, AB, BS)  Occupational History  . Not on file  Tobacco Use  . Smoking  status: Never Smoker  . Smokeless tobacco: Never Used  Vaping Use  . Vaping Use: Never used  Substance and Sexual Activity  . Alcohol use: Yes    Alcohol/week: 23.0 standard drinks    Types: 2 Glasses of wine, 14 Cans of beer, 7 Shots of liquor per week  . Drug use: No  . Sexual activity: Yes    Partners: Female    Birth control/protection: None  Other Topics Concern  . Not on file  Social History Narrative  . Not on file   Social Determinants of Health   Financial Resource Strain: Not on file  Food Insecurity: Not on file  Transportation Needs: Not on file  Physical Activity: Not on file  Stress: Not on file  Social Connections: Not on file    Allergies: No Known Allergies  Metabolic Disorder Labs: No results found for: HGBA1C, MPG No results found for: PROLACTIN No results found for: CHOL, TRIG, HDL, CHOLHDL, VLDL, LDLCALC Lab Results  Component Value Date   TSH 3.980 10/23/2018    Therapeutic Level Labs: No results found for: LITHIUM No results found for: VALPROATE No components found for:  CBMZ  Current Medications: Current Outpatient Medications  Medication Sig Dispense Refill  . amoxicillin-clavulanate (AUGMENTIN) 875-125 MG tablet Take 1 tablet by mouth 2 (two) times daily.    . bisoprolol-hydrochlorothiazide (ZIAC) 10-6.25 MG tablet Take 1 tablet by mouth daily.    . bisoprolol-hydrochlorothiazide (ZIAC) 10-6.25 MG tablet Take by mouth.    . busPIRone (BUSPAR) 7.5 MG tablet Take by mouth 1 tablet twice daily 60 tablet 1  . clonazePAM (KLONOPIN) 0.25 MG disintegrating tablet Take 1 tablet (0.25 mg total) by mouth 2 (two) times daily as needed. For anxiety attacks 60 tablet 1  . cyclobenzaprine (FLEXERIL) 10 MG tablet Take 10 mg by mouth 3 (three) times daily as needed for muscle spasms.     Marland Kitchen dicyclomine (BENTYL) 20 MG tablet Take 20 mg by mouth every 6 (six) hours as needed.    . dicyclomine (BENTYL) 20 MG tablet Can take every six hours as needed for  abdominal cramping    . methylphenidate 18 MG PO CR tablet Take 1 tablet (18 mg total) by mouth every morning. 30 tablet 0  . methylphenidate 18 MG PO CR tablet Take 1 tablet (18 mg total) by mouth daily. 30 tablet 0  . omeprazole (PRILOSEC) 20 MG capsule Take 20 mg by mouth daily.    Marland Kitchen omeprazole (PRILOSEC) 20 MG capsule Take by mouth.    . rosuvastatin (CRESTOR) 10 MG tablet Take 10 mg by mouth at bedtime.    . valACYclovir (VALTREX) 500 MG tablet TAKE 1 TABLET BY MOUTH ONCE DAILY    . vortioxetine HBr (TRINTELLIX) 20 MG TABS tablet Take 1 tablet (20 mg total) by mouth daily. 90 tablet 1  . zolpidem (AMBIEN CR) 12.5 MG CR tablet Take 1 tablet at bedtime 30 tablet 1   No current facility-administered medications for this visit.     Musculoskeletal: Strength & Muscle Tone: UTA Gait & Station: UTA Patient leans: N/A  Psychiatric Specialty  Exam: Review of Systems  Psychiatric/Behavioral: Negative for agitation, confusion, hallucinations and suicidal ideas. The patient is nervous/anxious. The patient is not hyperactive.   All other systems reviewed and are negative.   There were no vitals taken for this visit.There is no height or weight on file to calculate BMI.  General Appearance: Casual  Eye Contact:  Fair  Speech:  Clear and Coherent  Volume:  Normal  Mood:  Anxious coping well  Affect:  Congruent  Thought Process:  Goal Directed and Descriptions of Associations: Intact  Orientation:  Full (Time, Place, and Person)  Thought Content: Logical   Suicidal Thoughts:  No  Homicidal Thoughts:  No  Memory:  Immediate;   Fair Recent;   Fair Remote;   Fair  Judgement:  Fair  Insight:  Fair  Psychomotor Activity:  Normal  Concentration:  Concentration: Fair and Attention Span: Fair  Recall:  [employer] of Knowledge: Fair  Language: Fair  Akathisia:  No  Handed:  Right  AIMS (if indicated): UTA  Assets:  Communication Skills Desire for Improvement Housing Social Support   ADL's:  Intact  Cognition: WNL  Sleep:  Fair   Screenings: GAD-7   Flowsheet Row Video [facility]/21/2020 in [facility]  Total GAD-7 Score 5       Assessment and Plan: Taos Tapp is a 50 year old Caucasian male, employed, lives in [place], has a history of MDD, OCD, ADHD, insomnia was evaluated by telemedicine today.  Patient is currently coping with his anxiety well.  Plan as noted below.  Plan MDD in remission BuSpar 7.5 mg p.o. twice daily Trintellix 20 mg p.o. daily Ambien CR 12.5 mg p.o. nightly for sleep Klonopin 0.25 mg p.o. twice daily as needed for severe anxiety symptoms only.   Has been limiting use.  OCD-stable Continue CBT BuSpar 7.5 mg p.o. twice daily  ADHD-stable Concerta 18 mg p.o. daily I have reviewed [place] controlled substance database   GeneSight testing results were reviewed while making medication changes  Follow-up in clinic in 6 weeks or sooner if needed.  I have spent atleast 20 minutes face to face by video with patient today. More than 50 % of the time was spent for preparing to see the patient ( e.g., review of test, records ),  ordering medications and test ,psychoeducation and supportive psychotherapy and care coordination,as well as documenting clinical information in electronic health record. This note was generated in part or whole with voice recognition software. Voice recognition is usually quite accurate but there are transcription errors that can and very often do occur. I apologize for any typographical errors that were not detected and corrected.        Jomarie Longs, MD 11/03/2020, 8:32 AM

## 2020-11-04 ENCOUNTER — Telehealth: Payer: Self-pay

## 2020-11-04 NOTE — Telephone Encounter (Signed)
submitted the pa- pending

## 2020-11-04 NOTE — Telephone Encounter (Signed)
received a fax that a PA was needed for the zolpidem er

## 2020-11-06 ENCOUNTER — Telehealth: Payer: Self-pay | Admitting: Psychiatry

## 2020-11-06 NOTE — Telephone Encounter (Signed)
Attempted to contact patient to discuss denial received on Ambien.Left VM.

## 2020-11-09 NOTE — Telephone Encounter (Signed)
the zolpidem er was denited

## 2020-11-09 NOTE — Telephone Encounter (Signed)
I have already left a message for this patient to call us back to discuss medication change.

## 2020-11-11 ENCOUNTER — Telehealth: Payer: Self-pay | Admitting: Psychiatry

## 2020-11-11 ENCOUNTER — Telehealth: Payer: Self-pay

## 2020-11-11 DIAGNOSIS — G4700 Insomnia, unspecified: Secondary | ICD-10-CM

## 2020-11-11 MED ORDER — ZALEPLON 5 MG PO CAPS: 5.0000 mg | ORAL_CAPSULE | Freq: Every evening | ORAL | 1 refills | Status: DC | PRN

## 2020-11-11 NOTE — Telephone Encounter (Signed)
submitted the prior auth - pending 

## 2020-11-11 NOTE — Telephone Encounter (Signed)
prior auth needed for the zolpidem er

## 2020-11-11 NOTE — Telephone Encounter (Signed)
Returned call to patient to discuss that Ambien was denied by his health insurance plan.  Patient reports he is unable to shut his mind down at the end of the day and was unable to sleep last night since he did not have any more Ambien left.  We will start Sonata 5 mg p.o. nightly.  Provided medication education.

## 2020-12-07 ENCOUNTER — Telehealth: Payer: Self-pay

## 2020-12-07 NOTE — Telephone Encounter (Signed)
zolpidem tartrate er 12.5mg  er tablets denied

## 2020-12-12 ENCOUNTER — Other Ambulatory Visit: Payer: Self-pay | Admitting: Psychiatry

## 2020-12-12 DIAGNOSIS — G4700 Insomnia, unspecified: Secondary | ICD-10-CM

## 2020-12-24 ENCOUNTER — Encounter: Payer: Self-pay | Admitting: Psychiatry

## 2020-12-24 ENCOUNTER — Other Ambulatory Visit: Payer: Self-pay

## 2020-12-24 ENCOUNTER — Telehealth (INDEPENDENT_AMBULATORY_CARE_PROVIDER_SITE_OTHER): Payer: BC Managed Care – PPO | Admitting: Psychiatry

## 2020-12-24 DIAGNOSIS — F5101 Primary insomnia: Secondary | ICD-10-CM

## 2020-12-24 DIAGNOSIS — F429 Obsessive-compulsive disorder, unspecified: Secondary | ICD-10-CM

## 2020-12-24 DIAGNOSIS — F900 Attention-deficit hyperactivity disorder, predominantly inattentive type: Secondary | ICD-10-CM

## 2020-12-24 DIAGNOSIS — F3342 Major depressive disorder, recurrent, in full remission: Secondary | ICD-10-CM

## 2020-12-24 MED ORDER — ZALEPLON 5 MG PO CAPS
5.0000 mg | ORAL_CAPSULE | Freq: Every evening | ORAL | 1 refills | Status: DC | PRN
Start: 2020-12-24 — End: 2021-03-12

## 2020-12-24 MED ORDER — BUSPIRONE HCL 7.5 MG PO TABS: ORAL_TABLET | ORAL | 1 refills | Status: DC

## 2020-12-24 MED ORDER — METHYLPHENIDATE HCL ER (OSM) 18 MG PO TBCR: 18.0000 mg | EXTENDED_RELEASE_TABLET | Freq: Every morning | ORAL | 0 refills | Status: DC

## 2020-12-24 NOTE — Progress Notes (Signed)
Virtual Visit via Video Note  I connected with Jeremy Boyer on 12/24/20 at  8:30 AM EDT by a video enabled telemedicine application and verified that I am speaking with the correct person using two identifiers.  Location Provider Location : [facility] Patient Location : Home  Participants: Patient , Provider   I discussed the limitations of evaluation and management by telemedicine and the availability of in person appointments. The patient expressed understanding and agreed to proceed.    I discussed the assessment and treatment plan with the patient. The patient was provided an opportunity to ask questions and all were answered. The patient agreed with the plan and demonstrated an understanding of the instructions.   The patient was advised to call back or seek an in-person evaluation if the symptoms worsen or if the condition fails to improve as anticipated.  Video connection was lost at less than 50% of the duration of the visit, at which time the remainder of the visit was completed through audio only   [facility] MD OP Progress Note  12/24/2020 8:55 AM Jeremy Boyer  MRN:  [id number]  Chief Complaint:  Chief Complaint    Follow-up; ADD; Insomnia; Anxiety; Depression     HPI: Jeremy Boyer is a 50 year old Caucasian male, married, employed, lives in [place], has a history of MDD, OCD, ADHD, hypertension was evaluated by telemedicine today.  Patient today reports he has had episodes of feeling down however overall he has been managing okay and does not bother him much.  Denies any significant crying spells, low motivation and low energy.  He reports since starting the [place] he has been sleeping better.  The first few nights he woke up at 3 or 4 AM.  However after that he started sleeping throughout the night.  He however ran out of it a few days ago and since then has been having sleep problems.  He wants to get back on it.  Denies side effects.  Patient reports  work is going well.  He is compliant on his medications.  Patient denies any suicidality, homicidality or perceptual disturbances.  Patient denies any other concerns today.  Visit Diagnosis:    ICD-10-CM   1. MDD (major depressive disorder), recurrent, in full remission (HCC)  F33.42 busPIRone (BUSPAR) 7.5 MG tablet  2. 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 Attention deficit hyperactivity disorder (ADHD), predominantly inattentive type  F90.0   4. Primary insomnia  F51.01 zaleplon (SONATA) 5 MG capsule    Past Psychiatric History: I have reviewed past psychiatric history from my progress note on 10/23/2018.  Past trials of Paxil, Abilify, Trintellix, Depakote, mirtazapine, risperidone, duloxetine, Seroquel, Ambien  Past Medical History:  Past Medical History:  Diagnosis Date  . Anxiety and depression 12/01/2015  . Bipolar disorder (HCC)   . Cholecystitis   . Hypertension 07/05/2017  . Insomnia 07/05/2017  . MDD (major depressive disorder), recurrent episode, moderate (HCC) 01/13/2016  . OCD (obsessive compulsive disorder) 01/13/2016    Past Surgical History:  Procedure Laterality Date  . CHOLECYSTECTOMY N/A 07/10/2017   Procedure: LAPAROSCOPIC CHOLECYSTECTOMY;  Surgeon: Ancil Linsey, MD;  Location: [facility] ORS;  Service: General;  Laterality: N/A;  . VASECTOMY    . VASECTOMY REVERSAL      Family Psychiatric History: I have reviewed family psychiatric history from my progress note on 10/23/2018  Family History:  Family History  Problem Relation Age of Onset  . Anxiety disorder Mother   . Depression Mother   . Alcohol abuse Father   . Anxiety disorder Sister   . Depression Sister     Social History: Reviewed social history from my progress note on 10/23/2018 Social History   Socioeconomic History  . Marital status: Married    Spouse name: nancy  . Number of children: 3   . Years of education: Not on file  . Highest education level: Bachelor's degree (e.g., BA, AB, BS)  Occupational History  . Not on file  Tobacco Use  . Smoking status: Never Smoker  . Smokeless tobacco: Never Used  Vaping Use  . Vaping Use: Never used  Substance and Sexual Activity  . Alcohol use: Yes    Alcohol/week: 23.0 standard drinks    Types: 2 Glasses of wine, 14 Cans of beer, 7 Shots of liquor per week  . Drug use: No  . Sexual activity: Yes    Partners: Female    Birth control/protection: None  Other Topics Concern  . Not on file  Social History Narrative  . Not on file   Social Determinants of Health   Financial Resource Strain: Not on file  Food Insecurity: Not on file  Transportation Needs: Not on file  Physical Activity: Not on file  Stress: Not on file  Social Connections: Not on file    Allergies: No Known Allergies  Metabolic Disorder Labs: No results found for: HGBA1C, MPG No results found for: PROLACTIN No results found for: CHOL, TRIG, HDL, CHOLHDL, VLDL, LDLCALC Lab Results  Component Value Date   TSH 3.980 10/23/2018    Therapeutic Level Labs: No results found for: LITHIUM No results found for: VALPROATE No components found for:  CBMZ  Current Medications: Current Outpatient Medications  Medication Sig Dispense Refill  . amoxicillin-clavulanate (AUGMENTIN) 875-125 MG tablet Take 1 tablet by mouth 2 (two) times daily.    . bisoprolol-hydrochlorothiazide (ZIAC) 10-6.25 MG tablet Take 1 tablet by mouth daily.    . bisoprolol-hydrochlorothiazide (ZIAC) 10-6.25 MG tablet Take by mouth.    . busPIRone (BUSPAR) 7.5 MG tablet Take by mouth 1 tablet twice daily 60 tablet 1  . clonazePAM (KLONOPIN) 0.25 MG disintegrating tablet Take 1 tablet (0.25 mg total) by mouth 2 (two) times daily as needed. For anxiety attacks 60 tablet 1  . cyclobenzaprine (FLEXERIL) 10 MG tablet Take 10 mg by mouth 3 (three) times daily as needed for muscle spasms.     Marland Kitchen  dicyclomine (BENTYL) 20 MG tablet Take 20 mg by mouth every 6 (six) hours as needed.    . dicyclomine (BENTYL) 20 MG tablet Can take every six hours as needed for abdominal cramping    . methylphenidate 18 MG PO CR tablet Take 1 tablet (18 mg total) by mouth daily. 30 tablet 0  . methylphenidate 18 MG PO CR tablet Take 1 tablet (18 mg total) by mouth every morning. 30 tablet 0  . omeprazole (PRILOSEC) 20 MG capsule Take 20 mg by mouth daily.    Marland Kitchen omeprazole (PRILOSEC) 20 MG capsule Take by mouth.    . rosuvastatin (CRESTOR) 10 MG tablet Take 10 mg by mouth at bedtime.    . valACYclovir (VALTREX) 500 MG tablet TAKE 1 TABLET BY MOUTH ONCE DAILY    . vortioxetine HBr (TRINTELLIX) 20 MG TABS tablet Take 1 tablet (20 mg total) by mouth daily. 90 tablet 1  . zaleplon (SONATA) 5 MG capsule Take 1 capsule (  5 mg total) by mouth at bedtime as needed for sleep. 30 capsule 1   No current facility-administered medications for this visit.     Musculoskeletal: Strength & Muscle Tone: UTA Gait & Station: UTA Patient leans: N/A  Psychiatric Specialty Exam: Review of Systems  Psychiatric/Behavioral: Positive for sleep disturbance.  All other systems reviewed and are negative.   There were no vitals taken for this visit.There is no height or weight on file to calculate BMI.  General Appearance: Casual  Eye Contact:  Fair  Speech:  Clear and Coherent  Volume:  Normal  Mood:  Euthymic   Affect:  Congruent  Thought Process:  Goal Directed and Descriptions of Associations: Intact  Orientation:  Full (Time, Place, and Person)  Thought Content: Logical   Suicidal Thoughts:  No  Homicidal Thoughts:  No  Memory:  Immediate;   Fair Recent;   Fair Remote;   Fair  Judgement:  Fair  Insight:  Fair  Psychomotor Activity:  Normal  Concentration:  Concentration: Fair and Attention Span: Fair  Recall:  [employer] of Knowledge: Fair  Language: Fair  Akathisia:  No  Handed:  Right  AIMS (if indicated):  UTA  Assets:  Communication Skills Desire for Improvement Housing Social Support  ADL's:  Intact  Cognition: WNL  Sleep:  Poor   Screenings: GAD-7   Flowsheet Row Video Visit [facility]21/2020 in [facility]  Total GAD-7 Score 5       Assessment and Plan: Jeremy Boyer is a 50 year old Caucasian male, employed, lives in [place], has a history of MDD, OCD, ADHD, insomnia was evaluated by telemedicine today.  Patient is currently struggling with sleep and will benefit from the following plan.  Plan MDD in remission BuSpar 7.5 mg p.o. twice daily Trintellix 20 mg p.o. daily Klonopin 0.25 mg p.o. twice daily as needed for severe anxiety attacks. He has been limiting use.  OCD-stable Continue CBT BuSpar 7.5 mg p.o. twice daily  ADHD-stable Concerta 18 mg p.o. daily I have reviewed [place] controlled substance database  Primary insomnia-unstable Restart Sonata 5 mg p.o. nightly which was helpful however he ran out. Provided medication education  [facility] testing results were reviewed.  Follow-up in clinic in person in 4 weeks.   I have spent at least 18 minutes non face to face with patient today which includes the time spent for preparing to see the patient  (e.g., review of test, records), ordering medications and test, psychoeducation and supportive psychotherapy, care coordination as well as documenting clinical information in electronic health record.   This note was generated in part or whole with voice recognition software. Voice recognition is usually quite accurate but there are transcription errors that can and very often do occur. I apologize for any typographical errors that were not detected and corrected.       Jomarie Longs, MD 12/25/2020, 8:18 AM

## 2021-01-22 ENCOUNTER — Other Ambulatory Visit: Payer: Self-pay

## 2021-01-22 ENCOUNTER — Telehealth (INDEPENDENT_AMBULATORY_CARE_PROVIDER_SITE_OTHER): Payer: BC Managed Care – PPO | Admitting: Psychiatry

## 2021-01-22 ENCOUNTER — Encounter: Payer: Self-pay | Admitting: Psychiatry

## 2021-01-22 DIAGNOSIS — F429 Obsessive-compulsive disorder, unspecified: Secondary | ICD-10-CM

## 2021-01-22 DIAGNOSIS — F3342 Major depressive disorder, recurrent, in full remission: Secondary | ICD-10-CM

## 2021-01-22 DIAGNOSIS — F411 Generalized anxiety disorder: Secondary | ICD-10-CM | POA: Diagnosis not present

## 2021-01-22 DIAGNOSIS — F900 Attention-deficit hyperactivity disorder, predominantly inattentive type: Secondary | ICD-10-CM | POA: Diagnosis not present

## 2021-01-22 DIAGNOSIS — F5101 Primary insomnia: Secondary | ICD-10-CM

## 2021-01-22 MED ORDER — METHYLPHENIDATE HCL ER 18 MG PO TB24: 18.0000 mg | ORAL_TABLET | Freq: Every day | ORAL | 0 refills | Status: DC

## 2021-01-22 MED ORDER — BUSPIRONE HCL 15 MG PO TABS: 15.0000 mg | ORAL_TABLET | Freq: Two times a day (BID) | ORAL | 1 refills | Status: DC

## 2021-01-22 NOTE — Progress Notes (Signed)
Virtual Visit via Video Note  I connected with Jeremy Boyer on 01/22/21 at  9:00 AM EDT by a video enabled telemedicine application and verified that I am speaking with the correct person using two identifiers.  Location Provider Location : [facility] Patient Location : Car  Participants: Patient , Provider    I discussed the limitations of evaluation and management by telemedicine and the availability of in person appointments. The patient expressed understanding and agreed to proceed.   I discussed the assessment and treatment plan with the patient. The patient was provided an opportunity to ask questions and all were answered. The patient agreed with the plan and demonstrated an understanding of the instructions.   The patient was advised to call back or seek an in-person evaluation if the symptoms worsen or if the condition fails to improve as anticipated.   [facility] MD OP Progress Note  01/22/2021 9:20 AM Jeremy Boyer  MRN:  [id number]  Chief Complaint:  Chief Complaint    Follow-up; Anxiety; ADHD     HPI: Jeremy Boyer is a 50 year old Caucasian Boyer, married, employed, lives in [place], has a history of MDD, OCD, ADHD, hypertension was evaluated by telemedicine today.  Patient today reports he is currently on his spring break.  He reports so far he is having a good time.  Patient reports his depressive symptoms are stable.  His highs and lows have improved and he does not believe the mood swings are too significant anymore.  He does worry a lot.  He reports he does feel nervous, anxious often, worries about different things and has trouble relaxing.  Patient reports he is compliant on the BuSpar and the Trintellix.  Denies side effects.  Patient denies any suicidality, homicidality or perceptual disturbances.  Patient reports sleep as better on the Sonata.  He goes to bed at 9:30 PM and cannot wake up anywhere between 4:30 AM to 6:30 AM.  He reports he goes  through cycles when he wakes up too early.  He reports there are days that he will wake up at 6:30 AM he feels more rested.  His focus and concentration continues to be good on the Concerta.  Patient denies any OCD symptoms at this time.  Patient reports his GI symptoms have improved.  Patient denies any other concerns today.    Visit Diagnosis:    ICD-10-CM   1. MDD (major depressive disorder), recurrent, in full remission (HCC)  F33.42 busPIRone (BUSPAR) 15 MG tablet  2. 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 Attention deficit hyperactivity disorder (ADHD), predominantly inattentive type  F90.0 methylphenidate 18 MG PO CR tablet  4. GAD (generalized anxiety disorder)  F41.1   5. Primary insomnia  F51.01     Past Psychiatric History: Reviewed past psychiatric history from progress note on 10/23/2018.  Past trials of Paxil, Abilify, Trintellix, Depakote, mirtazapine, risperidone, duloxetine, Seroquel, Ambien  Past Medical History:  Past Medical History:  Diagnosis Date  . Anxiety and depression 12/01/2015  . Bipolar disorder (HCC)   . Cholecystitis   . Hypertension 07/05/2017  . Insomnia 07/05/2017  . MDD (major depressive disorder), recurrent episode, moderate (HCC) 01/13/2016  . OCD (obsessive compulsive disorder) 01/13/2016    Past Surgical History:  Procedure Laterality Date  . CHOLECYSTECTOMY N/A 07/10/2017   Procedure: LAPAROSCOPIC CHOLECYSTECTOMY;  Surgeon: Ancil Linseyavis, Jason Evan, MD;  Location: [facility] ORS;  Service: General;  Laterality: N/A;  . VASECTOMY    .  VASECTOMY REVERSAL      Family Psychiatric History: I have reviewed family psychiatric history from progress note on 10/23/2018  Family History:  Family History  Problem Relation Age of Onset  . Anxiety disorder Mother   . Depression Mother   . Alcohol abuse Father   . Anxiety disorder Sister   . Depression Sister     Social History: Reviewed social history  from progress note on 10/23/2018 Social History   Socioeconomic History  . Marital status: Married    Spouse name: nancy  . Number of children: 3  . Years of education: Not on file  . Highest education level: Bachelor's degree (e.g., BA, AB, BS)  Occupational History  . Not on file  Tobacco Use  . Smoking status: Never Smoker  . Smokeless tobacco: Never Used  Vaping Use  . Vaping Use: Never used  Substance and Sexual Activity  . Alcohol use: Yes    Alcohol/week: 23.0 standard drinks    Types: 2 Glasses of wine, 14 Cans of beer, 7 Shots of liquor per week  . Drug use: No  . Sexual activity: Yes    Partners: Female    Birth control/protection: None  Other Topics Concern  . Not on file  Social History Narrative  . Not on file   Social Determinants of Health   Financial Resource Strain: Not on file  Food Insecurity: Not on file  Transportation Needs: Not on file  Physical Activity: Not on file  Stress: Not on file  Social Connections: Not on file    Allergies: No Known Allergies  Metabolic Disorder Labs: No results found for: HGBA1C, MPG No results found for: PROLACTIN No results found for: CHOL, TRIG, HDL, CHOLHDL, VLDL, LDLCALC Lab Results  Component Value Date   TSH 3.980 10/23/2018    Therapeutic Level Labs: No results found for: LITHIUM No results found for: VALPROATE No components found for:  CBMZ  Current Medications: Current Outpatient Medications  Medication Sig Dispense Refill  . busPIRone (BUSPAR) 15 MG tablet Take 1 tablet (15 mg total) by mouth 2 (two) times daily. 60 tablet 1  . amoxicillin-clavulanate (AUGMENTIN) 875-125 MG tablet Take 1 tablet by mouth 2 (two) times daily.    . bisoprolol-hydrochlorothiazide (ZIAC) 10-6.25 MG tablet Take 1 tablet by mouth daily.    . bisoprolol-hydrochlorothiazide (ZIAC) 10-6.25 MG tablet Take by mouth.    . clonazePAM (KLONOPIN) 0.25 MG disintegrating tablet Take 1 tablet (0.25 mg total) by mouth 2 (two) times  daily as needed. For anxiety attacks 60 tablet 1  . cyclobenzaprine (FLEXERIL) 10 MG tablet Take 10 mg by mouth 3 (three) times daily as needed for muscle spasms.     Marland Kitchen dicyclomine (BENTYL) 20 MG tablet Take 20 mg by mouth every 6 (six) hours as needed.    . dicyclomine (BENTYL) 20 MG tablet Can take every six hours as needed for abdominal cramping    . methylphenidate 18 MG PO CR tablet Take 1 tablet (18 mg total) by mouth every morning. 30 tablet 0  . [START ON 01/23/2021] methylphenidate 18 MG PO CR tablet Take 1 tablet (18 mg total) by mouth daily. 30 tablet 0  . omeprazole (PRILOSEC) 20 MG capsule Take 20 mg by mouth daily.    Marland Kitchen omeprazole (PRILOSEC) 20 MG capsule Take by mouth.    . rosuvastatin (CRESTOR) 10 MG tablet Take 10 mg by mouth at bedtime.    . valACYclovir (VALTREX) 500 MG tablet TAKE 1 TABLET BY MOUTH  ONCE DAILY    . vortioxetine HBr (TRINTELLIX) 20 MG TABS tablet Take 1 tablet (20 mg total) by mouth daily. 90 tablet 1  . zaleplon (SONATA) 5 MG capsule Take 1 capsule (5 mg total) by mouth at bedtime as needed for sleep. 30 capsule 1   No current facility-administered medications for this visit.     Musculoskeletal: Strength & Muscle Tone: UTA Gait & Station: UTA Patient leans: N/A  Psychiatric Specialty Exam: Review of Systems  Psychiatric/Behavioral: Positive for sleep disturbance. The patient is nervous/anxious.   All other systems reviewed and are negative.   There were no vitals taken for this visit.There is no height or weight on file to calculate BMI.  General Appearance: Casual  Eye Contact:  Fair  Speech:  Clear and Coherent  Volume:  Normal  Mood:  Anxious  Affect:  Congruent  Thought Process:  Goal Directed and Descriptions of Associations: Intact  Orientation:  Full (Time, Place, and Person)  Thought Content: Logical   Suicidal Thoughts:  No  Homicidal Thoughts:  No  Memory:  Immediate;   Fair Recent;   Fair Remote;   Fair  Judgement:  Fair   Insight:  Fair  Psychomotor Activity:  Normal  Concentration:  Concentration: Fair and Attention Span: Fair  Recall:  [employer] of Knowledge: Fair  Language: Fair  Akathisia:  No  Handed:  Right  AIMS (if indicated): UTA  Assets:  Communication Skills Desire for Improvement Housing Social Support  ADL's:  Intact  Cognition: WNL  Sleep:  Restless   Screenings: GAD-7   Flowsheet Row Video Visit from 01/22/2021 in [facility] Video Visit from 09/21/2020 in [facility]  Total GAD-7 Score 12 5    PHQ2-9   Flowsheet Row Video Visit from 01/22/2021 in [facility]  PHQ-2 Total Score 1       Assessment and Plan: Macklen Wilhoite is a 50 year old Caucasian Boyer, employed, lives in [place], has a history of MDD, OCD, ADHD, insomnia was evaluated by telemedicine today.  Patient is currently making progress with regards to his depression however continues to struggle with anxiety and sleep.  Plan MDD in remission Trintellix 20 mg p.o. daily Klonopin 0.25 mg p.o. twice daily as needed for severe anxiety attacks only that she has been limiting use and uses up to twice a week.   OCD-stable Continue CBT Continue Trintellix and BuSpar as prescribed  ADHD-stable Concerta 18 mg p.o. daily Reviewed [place] controlled substance database Discussed with patient the need to monitor blood pressure and heart rate.  Primary insomnia-improving Discussed sleep hygiene techniques Discussed with patient to try taking a higher dosage of Sonata, 10 mg at bedtime as needed. He is currently on Sonata 5 mg p.o. nightly.  If he does well on the 10 mg advised patient to reach out to Clinical research associate for future refills.  GAD-unstable GAD-7 equals 12 Increase BuSpar to 15 mg p.o. twice daily Advised patient to continue CBT  Gene psych testing results reviewed  Follow-up in clinic in person in 6 weeks.  Discussed with  patient he needs to come into the office for evaluation of blood pressure and heart rate since he is on the Concerta.  Patient also will need repeat TSH labs.  He will talk to her primary care provider.  This note was generated in part or whole with voice recognition software. Voice recognition is usually quite accurate but there are transcription errors that can and  very often do occur. I apologize for any typographical errors that were not detected and corrected.        Jomarie Longs, MD 01/22/2021, 9:20 AM

## 2021-02-15 ENCOUNTER — Telehealth: Payer: Self-pay

## 2021-02-15 NOTE — Telephone Encounter (Signed)
received fax requesting a prior auth for the zaleplon 5mg 

## 2021-02-15 NOTE — Telephone Encounter (Signed)
went online to [email or web address] and submitted the prior auth . - pending 

## 2021-02-24 NOTE — Telephone Encounter (Signed)
received fax that prior auth for the zaleplon was approved from 02-24-21 to 02-25-24

## 2021-03-12 ENCOUNTER — Other Ambulatory Visit: Payer: Self-pay

## 2021-03-12 ENCOUNTER — Ambulatory Visit (INDEPENDENT_AMBULATORY_CARE_PROVIDER_SITE_OTHER): Payer: BC Managed Care – PPO | Admitting: Psychiatry

## 2021-03-12 ENCOUNTER — Encounter: Payer: Self-pay | Admitting: Psychiatry

## 2021-03-12 VITALS — BP 132/84 | HR 80 | Temp 99.0°F | Ht 70.0 in | Wt 195.0 lb

## 2021-03-12 DIAGNOSIS — F5101 Primary insomnia: Secondary | ICD-10-CM

## 2021-03-12 DIAGNOSIS — F411 Generalized anxiety disorder: Secondary | ICD-10-CM

## 2021-03-12 DIAGNOSIS — F429 Obsessive-compulsive disorder, unspecified: Secondary | ICD-10-CM

## 2021-03-12 DIAGNOSIS — F331 Major depressive disorder, recurrent, moderate: Secondary | ICD-10-CM | POA: Diagnosis not present

## 2021-03-12 DIAGNOSIS — F900 Attention-deficit hyperactivity disorder, predominantly inattentive type: Secondary | ICD-10-CM

## 2021-03-12 MED ORDER — METHYLPHENIDATE HCL ER 18 MG PO TB24
18.0000 mg | ORAL_TABLET | Freq: Every day | ORAL | 0 refills | Status: DC
Start: 2021-04-09 — End: 2021-06-14

## 2021-03-12 MED ORDER — BUSPIRONE HCL 15 MG PO TABS: 15.0000 mg | ORAL_TABLET | Freq: Two times a day (BID) | ORAL | 0 refills | Status: DC

## 2021-03-12 MED ORDER — ZALEPLON 10 MG PO CAPS: 10.0000 mg | ORAL_CAPSULE | Freq: Every evening | ORAL | 1 refills | Status: DC | PRN

## 2021-03-12 MED ORDER — METHYLPHENIDATE HCL ER (OSM) 18 MG PO TBCR
18.0000 mg | EXTENDED_RELEASE_TABLET | Freq: Every morning | ORAL | 0 refills | Status: DC
Start: 2021-03-12 — End: 2021-07-20

## 2021-03-12 MED ORDER — VORTIOXETINE HBR 20 MG PO TABS: 20.0000 mg | ORAL_TABLET | Freq: Every day | ORAL | 1 refills | Status: DC

## 2021-03-12 NOTE — Progress Notes (Signed)
BH MD OP Progress Note  03/12/2021 12:05 PM Jeremy Boyer  MRN:  [id number]  Chief Complaint:  Chief Complaint   Follow-up; Depression; Anxiety; Insomnia    HPI: Jeremy Boyer is a 50 year old Caucasian male, married, employed, lives in [place], has a history of MDD, OCD, ADHD, hypertension was evaluated in office today.  Patient today reports he is currently struggling with sadness, sleep problems since the past few days.  Patient also reports feeling nervous, anxious, restless, worrying about different things and having trouble relaxing.  This has been getting worse since the past few weeks.  Patient reports he is currently struggling with low back pain which is radiating down his right leg.  He reports he started struggling with back pain since the age of 22 or 4.  He got injured playing football.  Patient reports he goes through flareups like this.  He recently had a fall, 2 days ago which triggered this episode.  He is currently following up with pain provider-Dr. Yves Dill and has upcoming appointment scheduled.  Patient reports he got an injection for his pain a year ago and is due for another one.  Patient reports the pain does have an impact on his mood and is likely making him depressed and anxious.  Patient reports he continues to struggle with sleep.  He had sleep problems even before he had a flareup of his pain symptoms.  The Sonata helps him to sleep 4 to 5 hours.  He wakes up feeling not rested.  He is interested in dosage increase.  He denies side effects.  Patient reports another reason for his anxiety was because it was the end of the school year.  He had a lot going on at work.  However school is closed right now and he is trying to relax.  He is planning to take some time for self-care the summer.  Patient reports he is noncompliant with therapy since he does not believe it is beneficial.  Patient denies any suicidality, homicidality or perceptual  disturbances.  He however does report passive death wish when he feels it would have been better if he were not alive especially when he goes through pain and anxiety symptoms.  Patient however reports his children as a positive factor in his life.  He currently denies any active suicidal thoughts or plan and reports he will never do anything to hurt himself.  Patient denies any other concerns today.  Visit Diagnosis:    ICD-10-CM   1. MDD (major depressive disorder), recurrent episode, moderate (HCC)  F33.1 vortioxetine HBr (TRINTELLIX) 20 MG TABS tablet    2. 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 Attention deficit hyperactivity disorder (ADHD), predominantly inattentive type  F90.0 methylphenidate 18 MG PO CR tablet    4. GAD (generalized anxiety disorder)  F41.1     5. Primary insomnia  F51.01 zaleplon (SONATA) 10 MG capsule      Past Psychiatric History: Past trials of medications- Paxil Abilify Trintellix Depakote Mirtazapine Risperidone Duloxetine Seroquel Ambien I have reviewed past psychiatric history from progress note on 10/23/2018  Past Medical History:  Past Medical History:  Diagnosis Date   Anxiety and depression 12/01/2015   Bipolar disorder (HCC)    Cholecystitis    Hypertension 07/05/2017   Insomnia 07/05/2017   MDD (major depressive disorder), recurrent episode, moderate (HCC) 01/13/2016   OCD (obsessive  compulsive disorder) 01/13/2016    Past Surgical History:  Procedure Laterality Date   CHOLECYSTECTOMY N/A 07/10/2017   Procedure: LAPAROSCOPIC CHOLECYSTECTOMY;  Surgeon: Ancil Linseyavis, Jason Evan, MD;  Location: [facility] ORS;  Service: General;  Laterality: N/A;   VASECTOMY     VASECTOMY REVERSAL      Family Psychiatric History: Reviewed family psychiatric history from progress note on 10/23/2018  Family History:  Family History  Problem Relation Age of Onset    Anxiety disorder Mother    Depression Mother    Alcohol abuse Father    Anxiety disorder Sister    Depression Sister     Social History: Reviewed social history from progress note on 10/23/2018 Social History   Socioeconomic History   Marital status: Married    Spouse name: nancy   Number of children: 3   Years of education: Not on file   Highest education level: Bachelor's degree (e.g., BA, AB, BS)  Occupational History   Not on file  Tobacco Use   Smoking status: Never   Smokeless tobacco: Never  Vaping Use   Vaping Use: Never used  Substance and Sexual Activity   Alcohol use: Yes    Alcohol/week: 23.0 standard drinks    Types: 2 Glasses of wine, 14 Cans of beer, 7 Shots of liquor per week   Drug use: No   Sexual activity: Yes    Partners: Female    Birth control/protection: None  Other Topics Concern   Not on file  Social History Narrative   Not on file   Social Determinants of Health   Financial Resource Strain: Not on file  Food Insecurity: Not on file  Transportation Needs: Not on file  Physical Activity: Not on file  Stress: Not on file  Social Connections: Not on file    Allergies: No Known Allergies  Metabolic Disorder Labs: No results found for: HGBA1C, MPG No results found for: PROLACTIN No results found for: CHOL, TRIG, HDL, CHOLHDL, VLDL, LDLCALC Lab Results  Component Value Date   TSH 3.980 10/23/2018    Therapeutic Level Labs: No results found for: LITHIUM No results found for: VALPROATE No components found for:  CBMZ  Current Medications: Current Outpatient Medications  Medication Sig Dispense Refill   zaleplon (SONATA) 10 MG capsule Take 1 capsule (10 mg total) by mouth at bedtime as needed for sleep. 30 capsule 1   amoxicillin-clavulanate (AUGMENTIN) 875-125 MG tablet Take 1 tablet by mouth 2 (two) times daily.     bisoprolol-hydrochlorothiazide (ZIAC) 10-6.25 MG tablet Take 1 tablet by mouth daily.      bisoprolol-hydrochlorothiazide (ZIAC) 10-6.25 MG tablet Take by mouth.     busPIRone (BUSPAR) 15 MG tablet Take 1 tablet (15 mg total) by mouth 2 (two) times daily. 180 tablet 0   clonazePAM (KLONOPIN) 0.25 MG disintegrating tablet Take 1 tablet (0.25 mg total) by mouth 2 (two) times daily as needed. For anxiety attacks 60 tablet 1   cyclobenzaprine (FLEXERIL) 10 MG tablet Take 10 mg by mouth 3 (three) times daily as needed for muscle spasms.      dicyclomine (BENTYL) 20 MG tablet Take 20 mg by mouth every 6 (six) hours as needed.     dicyclomine (BENTYL) 20 MG tablet Can take every six hours as needed for abdominal cramping     methylphenidate 18 MG PO CR tablet Take 1 tablet (18 mg total) by mouth every morning. 30 tablet 0   [START ON 04/09/2021] methylphenidate 18 MG PO CR tablet Take  1 tablet (18 mg total) by mouth daily. 30 tablet 0   omeprazole (PRILOSEC) 20 MG capsule Take 20 mg by mouth daily.     omeprazole (PRILOSEC) 20 MG capsule Take by mouth.     rosuvastatin (CRESTOR) 10 MG tablet Take 10 mg by mouth at bedtime.     valACYclovir (VALTREX) 500 MG tablet TAKE 1 TABLET BY MOUTH ONCE DAILY     vortioxetine HBr (TRINTELLIX) 20 MG TABS tablet Take 1 tablet (20 mg total) by mouth daily. 90 tablet 1   No current facility-administered medications for this visit.     Musculoskeletal: Strength & Muscle Tone:  UTA Gait & Station:  unsteady at times due to pain and numbness of right sided LE Patient leans: N/A  Psychiatric Specialty Exam: Review of Systems  Musculoskeletal:  Positive for back pain.  Psychiatric/Behavioral:  Positive for dysphoric mood and sleep disturbance. The patient is nervous/anxious.   All other systems reviewed and are negative.  Blood pressure 132/84, pulse 80, temperature 99 F (37.2 C), height 5\' 10"  (1.778 m), weight 195 lb (88.5 kg).Body mass index is 27.98 kg/m.  General Appearance: Casual  Eye Contact:  Fair  Speech:  Clear and Coherent  Volume:  Normal   Mood:  Anxious and Depressed  Affect:  Congruent  Thought Process:  Goal Directed and Descriptions of Associations: Intact  Orientation:  Full (Time, Place, and Person)  Thought Content: Logical   Suicidal Thoughts:  No  Homicidal Thoughts:  No  Memory:  Immediate;   Fair Recent;   Fair Remote;   Fair  Judgement:  Fair  Insight:  Fair  Psychomotor Activity:  Normal  Concentration:  Concentration: Fair and Attention Span: Fair  Recall:  of Knowledge: Fair  Language: Fair  Akathisia:  No  Handed:  Right  AIMS (if indicated): not done  Assets:  Communication Skills Desire for Improvement Housing Intimacy Resilience Social Support Talents/Skills Transportation Vocational/Educational  ADL's:  Intact  Cognition: WNL  Sleep:  Poor   Screenings: GAD-7    Flowsheet Row Office Visit from 03/12/2021 in [facility] Video Visit from 01/22/2021 in [facility] Video Visit from 09/21/2020 in [facility]  Total GAD-7 Score 13 12 5       PHQ2-9    Flowsheet Row Office Visit from 03/12/2021 in [facility] Video Visit from 01/22/2021 in [facility]  PHQ-2 Total Score 3 1  PHQ-9 Total Score 10 --      Flowsheet Row Office Visit from 03/12/2021 in [facility]  C-SSRS RISK CATEGORY Low Risk        Assessment and Plan: Jeremy Boyer is a 50 year old Caucasian male, employed, lives in [place], has a history of MDD, OCD, ADHD, insomnia was evaluated by telemedicine today.  Patient is currently struggling with sleep , anxiety and depression which are exacerbated because of recent situational stressors and pain.  Plan as noted below.  Plan MDD depressed moderate-unstable Trintellix 20 mg p.o. daily Klonopin 0.25 mg p.o. twice daily as needed for severe anxiety attacks only. BuSpar was recently  readjusted.  We will consider increasing the dosage as needed. However patient will benefit from sufficient pain management.  He will reach out to writer if depressive symptoms does not get better once pain is more under control. Patient will benefit from CBT.  Discussed to establish care with a new provider.  OCD-stable Continue CBT Continue Trintellix and  BuSpar as prescribed  ADHD-stable Concerta 18 mg p.o. daily Reviewed [place] controlled substance database. Provided 2 prescriptions with date specified.   Primary insomnia-unstable Increase Sonata to 10 mg p.o. nightly. Reviewed [place] PMP aware. Patient also with pain which does have an impact on his sleep.  He will benefit from sufficient pain management.  He will reach out to his provider.  GAD-unstable Continue BuSpar 15 mg p.o. twice daily Trintellix as prescribed Patient will benefit from CBT, discussed the same.  He has been noncompliant.  Genesight testing results reviewed.  Follow-up in clinic in 4 to 5 weeks or sooner if needed.  This note was generated in part or whole with voice recognition software. Voice recognition is usually quite accurate but there are transcription errors that can and very often do occur. I apologize for any typographical errors that were not detected and corrected.        Jomarie Longs, MD 03/12/2021, 12:05 PM

## 2021-04-21 ENCOUNTER — Telehealth (INDEPENDENT_AMBULATORY_CARE_PROVIDER_SITE_OTHER): Payer: BC Managed Care – PPO | Admitting: Psychiatry

## 2021-04-21 ENCOUNTER — Encounter: Payer: Self-pay | Admitting: Psychiatry

## 2021-04-21 ENCOUNTER — Other Ambulatory Visit: Payer: Self-pay

## 2021-04-21 DIAGNOSIS — F5101 Primary insomnia: Secondary | ICD-10-CM

## 2021-04-21 DIAGNOSIS — F411 Generalized anxiety disorder: Secondary | ICD-10-CM

## 2021-04-21 DIAGNOSIS — F331 Major depressive disorder, recurrent, moderate: Secondary | ICD-10-CM

## 2021-04-21 DIAGNOSIS — F900 Attention-deficit hyperactivity disorder, predominantly inattentive type: Secondary | ICD-10-CM | POA: Diagnosis not present

## 2021-04-21 DIAGNOSIS — F429 Obsessive-compulsive disorder, unspecified: Secondary | ICD-10-CM | POA: Diagnosis not present

## 2021-04-21 MED ORDER — ZALEPLON 10 MG PO CAPS: 10.0000 mg | ORAL_CAPSULE | Freq: Every evening | ORAL | 1 refills | Status: DC | PRN

## 2021-04-21 MED ORDER — CLONAZEPAM 0.25 MG PO TBDP: 0.2500 mg | ORAL_TABLET | Freq: Two times a day (BID) | ORAL | 1 refills | Status: DC | PRN

## 2021-04-21 NOTE — Progress Notes (Signed)
Virtual Visit via Video Note  I connected with Jeremy Boyer on 04/21/21 at  8:30 AM EDT by a video enabled telemedicine application and verified that I am speaking with the correct person using two identifiers.  Location Provider Location : [facility] Patient Location : Home  Participants: Patient , Provider    I discussed the limitations of evaluation and management by telemedicine and the availability of in person appointments. The patient expressed understanding and agreed to proceed.    I discussed the assessment and treatment plan with the patient. The patient was provided an opportunity to ask questions and all were answered. The patient agreed with the plan and demonstrated an understanding of the instructions.   The patient was advised to call back or seek an in-person evaluation if the symptoms worsen or if the condition fails to improve as anticipated.   Park Layne MD OP Progress Note  04/21/2021 2:01 PM Jeremy Boyer  MRN:  [id number]  Chief Complaint:  Chief Complaint   Follow-up; ADD; Anxiety    HPI: Jeremy Boyer is a 50 year old Caucasian male, married, employed, lives in [place], has a history of MDD, OCD, ADHD, hypertension was evaluated by telemedicine today.  Patient today reports he continues to feel depressed.  He struggles with lack of motivation and feels sad and down often.  Patient reports he however is able to enjoy the time he spends with his children however cannot get rid of this depressed feeling that he has.  He reports sleep is overall okay as long as he takes the medications.  Patient denies any suicidality, homicidality or perceptual disturbances.  He reports his concentration is good.  He does have Concerta available.  Patient reports he is currently struggling with GI problems, had recent rectal bleeding and has upcoming appointment with gastroenterologist and has colonoscopy scheduled.  Patient is currently not in psychotherapy  sessions.  He reports he is not interested.  Patient denies any other concerns today.  Visit Diagnosis:    ICD-10-CM   1. MDD (major depressive disorder), recurrent episode, moderate (HCC)  F33.1 clonazePAM (KLONOPIN) 0.25 MG disintegrating tablet    2. 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 Attention deficit hyperactivity disorder (ADHD), predominantly inattentive type  F90.0     4. GAD (generalized anxiety disorder)  F41.1     5. Primary insomnia  F51.01 zaleplon (SONATA) 10 MG capsule      Past Psychiatric History: Reviewed past psychiatric history from progress note on 10/23/2018. Past trials of medications- Paxil Abilify Trintellix Depakote Mirtazapine Risperidone Duloxetine Seroquel Ambien  Past Medical History:  Past Medical History:  Diagnosis Date   Anxiety and depression 12/01/2015   Bipolar disorder ([place])    Cholecystitis    Hypertension 07/05/2017   Insomnia 07/05/2017   MDD (major depressive disorder), recurrent episode, moderate ([place]) 01/13/2016   OCD (obsessive compulsive disorder) 01/13/2016    Past Surgical History:  Procedure Laterality Date   CHOLECYSTECTOMY N/A 07/10/2017   Procedure: LAPAROSCOPIC CHOLECYSTECTOMY;  Surgeon: Vickie Epley, MD;  Location: [facility];  Service: General;  Laterality: N/A;   VASECTOMY     VASECTOMY REVERSAL      Family Psychiatric History: Reviewed family psychiatric history from progress note on 10/23/2018  Family History:  Family History  Problem Relation Age of Onset   Anxiety disorder Mother    Depression Mother    Alcohol abuse Father    Anxiety disorder Sister    Depression  Sister     Social History: Reviewed social history from progress note on 10/23/2018 Social History   Socioeconomic History   Marital status: Married    Spouse name: nancy   Number of children: 3   Years of education: Not on file   Highest education level: Bachelor's degree (e.g., BA, AB, BS)   Occupational History   Not on file  Tobacco Use   Smoking status: Never   Smokeless tobacco: Never  Vaping Use   Vaping Use: Never used  Substance and Sexual Activity   Alcohol use: Yes    Alcohol/week: 23.0 standard drinks    Types: 2 Glasses of wine, 14 Cans of beer, 7 Shots of liquor per week   Drug use: No   Sexual activity: Yes    Partners: Female    Birth control/protection: None  Other Topics Concern   Not on file  Social History Narrative   Not on file   Social Determinants of Health   Financial Resource Strain: Not on file  Food Insecurity: Not on file  Transportation Needs: Not on file  Physical Activity: Not on file  Stress: Not on file  Social Connections: Not on file    Allergies: No Known Allergies  Metabolic Disorder Labs: No results found for: HGBA1C, MPG No results found for: PROLACTIN No results found for: CHOL, TRIG, HDL, CHOLHDL, VLDL, LDLCALC Lab Results  Component Value Date   TSH 3.980 10/23/2018    Therapeutic Level Labs: No results found for: LITHIUM No results found for: VALPROATE No components found for:  CBMZ  Current Medications: Current Outpatient Medications  Medication Sig Dispense Refill   amoxicillin-clavulanate (AUGMENTIN) 875-125 MG tablet Take 1 tablet by mouth 2 (two) times daily.     BINAXNOW COVID-19 AG HOME TEST KIT TEST AS DIRECTED TODAY     bisoprolol-hydrochlorothiazide (ZIAC) 10-6.25 MG tablet Take 1 tablet by mouth daily.     bisoprolol-hydrochlorothiazide (ZIAC) 10-6.25 MG tablet Take by mouth.     busPIRone (BUSPAR) 15 MG tablet Take 1 tablet (15 mg total) by mouth 2 (two) times daily. 180 tablet 0   clonazePAM (KLONOPIN) 0.25 MG disintegrating tablet Take 1 tablet (0.25 mg total) by mouth 2 (two) times daily as needed. For anxiety attacks 60 tablet 1   cyclobenzaprine (FLEXERIL) 10 MG tablet Take 10 mg by mouth 3 (three) times daily as needed for muscle spasms.      dicyclomine (BENTYL) 20 MG tablet Take 20 mg  by mouth every 6 (six) hours as needed.     dicyclomine (BENTYL) 20 MG tablet Can take every six hours as needed for abdominal cramping     methylphenidate 18 MG PO CR tablet Take 1 tablet (18 mg total) by mouth every morning. 30 tablet 0   methylphenidate 18 MG PO CR tablet Take 1 tablet (18 mg total) by mouth daily. 30 tablet 0   omeprazole (PRILOSEC) 20 MG capsule Take 20 mg by mouth daily.     omeprazole (PRILOSEC) 20 MG capsule Take by mouth.     rosuvastatin (CRESTOR) 10 MG tablet Take 10 mg by mouth at bedtime.     valACYclovir (VALTREX) 500 MG tablet TAKE 1 TABLET BY MOUTH ONCE DAILY     vortioxetine HBr (TRINTELLIX) 20 MG TABS tablet Take 1 tablet (20 mg total) by mouth daily. 90 tablet 1   zaleplon (SONATA) 10 MG capsule Take 1 capsule (10 mg total) by mouth at bedtime as needed for sleep. 30 capsule 1  No current facility-administered medications for this visit.     Musculoskeletal: Strength & Muscle Tone:  UTA Gait & Station:  UTA Patient leans: N/A  Psychiatric Specialty Exam: Review of Systems  Gastrointestinal:  Positive for anal bleeding and diarrhea.  Psychiatric/Behavioral:  Positive for dysphoric mood.   All other systems reviewed and are negative.  There were no vitals taken for this visit.There is no height or weight on file to calculate BMI.  General Appearance: Casual  Eye Contact:  Good  Speech:  Clear and Coherent  Volume:  Normal  Mood:  Depressed  Affect:  Depressed  Thought Process:  Goal Directed and Descriptions of Associations: Intact  Orientation:  Full (Time, Place, and Person)  Thought Content: Logical   Suicidal Thoughts:  No  Homicidal Thoughts:  No  Memory:  Immediate;   Fair Recent;   Fair Remote;   Fair  Judgement:  Fair  Insight:  Fair  Psychomotor Activity:  Normal  Concentration:  Concentration: Fair and Attention Span: Fair  Recall:  [employer] of Knowledge: Fair  Language: Fair  Akathisia:  No  Handed:  Right  AIMS (if  indicated): not done  Assets:  Communication Skills Desire for [place] Talents/Skills Transportation Vocational/Educational  ADL's:  Intact  Cognition: WNL  Sleep:  Fair   Screenings: GAD-7    Flowsheet Row Video Visit from 04/21/2021 in [place] Office Visit from 03/12/2021 in [place] Video Visit from 01/22/2021 in [place] Video Visit from 09/21/2020 in [place]  Total GAD-7 Score 9 13 12 5       PHQ2-9    Flowsheet Row Video Visit from 04/21/2021 in [place] Office Visit from 03/12/2021 in [place] Video Visit from 01/22/2021 in [place]  PHQ-2 Total Score 4 3 1   PHQ-9 Total Score 8 10 --      Flowsheet Row Video Visit from 04/21/2021 in [place] Office Visit from 03/12/2021 in [place] No Risk Low Risk        Assessment and Plan: Jeremy Boyer is a 50 year old Caucasian male, employed, lives in [place], has a history of MDD, OCD, ADHD, insomnia was evaluated by telemedicine today.  Patient is currently struggling with depressive symptoms.  Patient however is not interested in more medication changes and reports he is not interested in therapy.  Plan as noted below.  Plan MDD-unstable Trintellix 20 mg p.o. daily Klonopin 0.25 mg p.o. twice daily for severe anxiety attacks BuSpar as prescribed We will refer patient for [place].  We will send patient to [facility]  OCD-stable Continue CBT Continue Trintellix and BuSpar 15 mg p.o. 2 times daily  ADHD-stable Concerta 18 mg p.o. daily Reviewed in [facility].  Primary insomnia-improving Sonata 10 mg p.o. nightly  GAD-improving BuSpar 15 mg p.o. twice daily Trintellix as  prescribed  Genesight testing results reviewed.  Patient to sign a release to obtain medical records from gastroenterology to coordinate care.  Follow-up in clinic in 4 weeks or sooner if needed.  I have spent atleast 30 minutes face to face with patient today which includes the time spent for preparing to see the patient ( e.g., review of test, records ),  ordering medications and test ,psychoeducation and supportive psychotherapy and care coordination,as well as documenting clinical information in electronic health record,interpreting and communication of test results  This note was generated in part or whole with voice recognition software. Voice recognition is usually quite accurate but there are transcription errors that can and very often do occur. I apologize for any typographical errors that were not detected and corrected.     Ursula Alert, MD 04/21/2021, 2:01 PM

## 2021-05-27 ENCOUNTER — Telehealth (INDEPENDENT_AMBULATORY_CARE_PROVIDER_SITE_OTHER): Payer: BC Managed Care – PPO | Admitting: Psychiatry

## 2021-05-27 ENCOUNTER — Other Ambulatory Visit: Payer: Self-pay

## 2021-05-27 ENCOUNTER — Encounter: Payer: Self-pay | Admitting: Psychiatry

## 2021-05-27 DIAGNOSIS — F5101 Primary insomnia: Secondary | ICD-10-CM

## 2021-05-27 DIAGNOSIS — F331 Major depressive disorder, recurrent, moderate: Secondary | ICD-10-CM

## 2021-05-27 DIAGNOSIS — F429 Obsessive-compulsive disorder, unspecified: Secondary | ICD-10-CM

## 2021-05-27 DIAGNOSIS — F900 Attention-deficit hyperactivity disorder, predominantly inattentive type: Secondary | ICD-10-CM

## 2021-05-27 DIAGNOSIS — F411 Generalized anxiety disorder: Secondary | ICD-10-CM

## 2021-05-27 MED ORDER — ZALEPLON 5 MG PO CAPS: 5.0000 mg | ORAL_CAPSULE | Freq: Every evening | ORAL | 1 refills | Status: DC | PRN

## 2021-05-27 NOTE — Progress Notes (Signed)
Virtual Visit via Video Note  I connected with Lucillie Garfinkel on 05/27/21 at  3:40 PM EDT by a video enabled telemedicine application and verified that I am speaking with the correct person using two identifiers.  Location Provider Location : [facility] Patient Location : Car  Participants: Patient , Provider   I discussed the limitations of evaluation and management by telemedicine and the availability of in person appointments. The patient expressed understanding and agreed to proceed.    I discussed the assessment and treatment plan with the patient. The patient was provided an opportunity to ask questions and all were answered. The patient agreed with the plan and demonstrated an understanding of the instructions.   The patient was advised to call back or seek an in-person evaluation if the symptoms worsen or if the condition fails to improve as anticipated.  [place] MD OP Progress Note  05/27/2021 6:10 PM Ralphael Southgate  MRN:  [id number]  Chief Complaint:  Chief Complaint   Follow-up; Depression; Anxiety    HPI: Boyce Keltner is a 50 year old Caucasian male, married, employed, lives in [place], has a history of MDD, OCD, ADHD, hypertension was evaluated by telemedicine today.  Patient today reports overall his mood has been better the past couple of weeks.  He feels more motivated and was able to do more activities than last week.  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  Patient reports however the past few days he has not been able to sleep.  Out of 14 nights he had at least 6 nights when he could not sleep well.  He tries to avoid caffeine, does not use alcohol, follows a good sleep hygiene.  In spite of that sleep has been restless.  He has been taking the Sonata 10 mg.  He is able to sleep some nights on it.  Patient denies any suicidality, homicidality or perceptual disturbances.  Patient denies any other concerns today.  Visit  Diagnosis:    ICD-10-CM   1. MDD (major depressive disorder), recurrent episode, moderate (HCC)  F33.1     2. 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 Attention deficit hyperactivity disorder (ADHD), predominantly inattentive type  F90.0     4. GAD (generalized anxiety disorder)  F41.1     5. Primary insomnia  F51.01 zaleplon (SONATA) 5 MG capsule      Past Psychiatric History: Reviewed past psychiatric history from progress note on 10/23/2018.  Past trials of Paxil, Abilify, Trintellix, Depakote, mirtazapine, risperidone, duloxetine, Seroquel, Ambien  Past Medical History:  Past Medical History:  Diagnosis Date   Anxiety and depression 12/01/2015   Bipolar disorder ([place])    Cholecystitis    Hypertension 07/05/2017   Insomnia 07/05/2017   MDD (major depressive disorder), recurrent episode, moderate ([place]) 01/13/2016   OCD (obsessive compulsive disorder) 01/13/2016    Past Surgical History:  Procedure Laterality Date   CHOLECYSTECTOMY N/A 07/10/2017   Procedure: LAPAROSCOPIC CHOLECYSTECTOMY;  Surgeon: Vickie Epley, MD;  Location: [facility] ORS;  Service: General;  Laterality: N/A;   VASECTOMY     VASECTOMY REVERSAL      Family Psychiatric History: I have reviewed family psychiatric history from progress note on 10/23/2018  Family History:  Family History  Problem Relation Age of Onset   Anxiety disorder Mother    Depression Mother    Alcohol abuse Father    Anxiety disorder Sister    Depression Sister  Social History: I have reviewed social history from progress note on 10/23/2018 Social History   Socioeconomic History   Marital status: Married    Spouse name: nancy   Number of children: 3   Years of education: Not on file   Highest education level: Bachelor's degree (e.g., BA, AB, BS)  Occupational History   Not on file  Tobacco Use   Smoking status: Never   Smokeless tobacco: Never  Vaping Use   Vaping Use: Never used  Substance  and Sexual Activity   Alcohol use: Yes    Alcohol/week: 23.0 standard drinks    Types: 2 Glasses of wine, 14 Cans of beer, 7 Shots of liquor per week   Drug use: No   Sexual activity: Yes    Partners: Female    Birth control/protection: None  Other Topics Concern   Not on file  Social History Narrative   Not on file   Social Determinants of Health   Financial Resource Strain: Not on file  Food Insecurity: Not on file  Transportation Needs: Not on file  Physical Activity: Not on file  Stress: Not on file  Social Connections: Not on file    Allergies: No Known Allergies  Metabolic Disorder Labs: No results found for: HGBA1C, MPG No results found for: PROLACTIN No results found for: CHOL, TRIG, HDL, CHOLHDL, VLDL, LDLCALC Lab Results  Component Value Date   TSH 3.980 10/23/2018    Therapeutic Level Labs: No results found for: LITHIUM No results found for: VALPROATE No components found for:  CBMZ  Current Medications: Current Outpatient Medications  Medication Sig Dispense Refill   zaleplon (SONATA) 5 MG capsule Take 1 capsule (5 mg total) by mouth at bedtime as needed for sleep. Take along with 10 mg at bedtime as needed 30 capsule 1   amoxicillin-clavulanate (AUGMENTIN) 875-125 MG tablet Take 1 tablet by mouth 2 (two) times daily.     BINAXNOW COVID-19 AG HOME TEST KIT TEST AS DIRECTED TODAY     bisoprolol-hydrochlorothiazide (ZIAC) 10-6.25 MG tablet Take 1 tablet by mouth daily.     bisoprolol-hydrochlorothiazide (ZIAC) 10-6.25 MG tablet Take by mouth.     busPIRone (BUSPAR) 15 MG tablet Take 1 tablet (15 mg total) by mouth 2 (two) times daily. 180 tablet 0   clonazePAM (KLONOPIN) 0.25 MG disintegrating tablet Take 1 tablet (0.25 mg total) by mouth 2 (two) times daily as needed. For anxiety attacks 60 tablet 1   cyclobenzaprine (FLEXERIL) 10 MG tablet Take 10 mg by mouth 3 (three) times daily as needed for muscle spasms.      dicyclomine (BENTYL) 20 MG tablet Take 20  mg by mouth every 6 (six) hours as needed.     dicyclomine (BENTYL) 20 MG tablet Can take every six hours as needed for abdominal cramping     methylphenidate 18 MG PO CR tablet Take 1 tablet (18 mg total) by mouth every morning. 30 tablet 0   methylphenidate 18 MG PO CR tablet Take 1 tablet (18 mg total) by mouth daily. 30 tablet 0   omeprazole (PRILOSEC) 20 MG capsule Take 20 mg by mouth daily.     omeprazole (PRILOSEC) 20 MG capsule Take by mouth.     rosuvastatin (CRESTOR) 10 MG tablet Take 10 mg by mouth at bedtime.     valACYclovir (VALTREX) 500 MG tablet TAKE 1 TABLET BY MOUTH ONCE DAILY     vortioxetine HBr (TRINTELLIX) 20 MG TABS tablet Take 1 tablet (20 mg total) by  mouth daily. 90 tablet 1   zaleplon (SONATA) 10 MG capsule Take 1 capsule (10 mg total) by mouth at bedtime as needed for sleep. 30 capsule 1   No current facility-administered medications for this visit.     Musculoskeletal: Strength & Muscle Tone:  UTA Gait & Station:  Seated Patient leans: N/A  Psychiatric Specialty Exam: Review of Systems  Psychiatric/Behavioral:  Positive for sleep disturbance.   All other systems reviewed and are negative.  There were no vitals taken for this visit.There is no height or weight on file to calculate BMI.  General Appearance: Casual  Eye Contact:  Fair  Speech:  Clear and Coherent  Volume:  Normal  Mood:  Euthymic  Affect:  Congruent  Thought Process:  Goal Directed and Descriptions of Associations: Intact  Orientation:  Full (Time, Place, and Person)  Thought Content: Logical   Suicidal Thoughts:  No  Homicidal Thoughts:  No  Memory:  Immediate;   Fair Recent;   Fair Remote;   Fair  Judgement:  Fair  Insight:  Fair  Psychomotor Activity:  Normal  Concentration:  Concentration: Fair and Attention Span: Fair  Recall:  [employer] of Knowledge: Fair  Language: Fair  Akathisia:  No  Handed:  Right  AIMS (if indicated): done  Assets:  Communication Skills Desire  for Improvement Social Support Talents/Skills  ADL's:  Intact  Cognition: WNL  Sleep:  Poor   Screenings: GAD-7    Flowsheet Row Video Visit from 04/21/2021 in [facility] Office Visit from 03/12/2021 in [place] [facility] Video Visit from 01/22/2021 in [facility] Video Visit from 09/21/2020 in [place]  Total GAD-7 Score 9 13 12 5       PHQ2-9    Flowsheet Row Video Visit from 04/21/2021 in [place] Office Visit from 03/12/2021 in [place] Video Visit from 01/22/2021 in [place]  PHQ-2 Total Score 4 3 1   PHQ-9 Total Score 8 10 --      Flowsheet Row Video Visit from 04/21/2021 in [place] Office Visit from 03/12/2021 in [place] No Risk Low Risk        Assessment and Plan: Marquan Vokes is a 50 year old Caucasian male, employed, lives in [place], has a history of MDD, OCD, ADHD, insomnia was evaluated by telemedicine today.  Patient is currently struggling with sleep.  Plan as noted below.  Plan MDD-improving Trintellix 20 mg p.o. daily Klonopin 0.25 mg p.o. twice daily as needed for severe anxiety attacks BuSpar as prescribed. Patient was referred for TMS-pending  OCD-stable Continue CBT Continue BuSpar 15 mg p.o. 3 times daily Trintellix 20 mg p.o. daily  ADHD-stable Concerta 18 mg p.o. daily Reviewed [place] PMP Aware  Primary insomnia-unstable Sonata 10 mg p.o. nightly Add Sonata 5 mg p.o. nightly as needed. Continue sleep hygiene techniques  GAD-improving BuSpar and Trintellix as prescribed  Genesight testing results reviewed while making medication changes  Follow-up in clinic in 2 weeks or sooner if needed.  This note was generated in part or whole with voice  recognition software. Voice recognition is usually quite accurate but there are transcription errors that can and very often do occur. I apologize for any typographical errors that were not detected and corrected.       Ursula Alert, MD 05/27/2021, 6:10 PM

## 2021-06-14 ENCOUNTER — Encounter: Payer: Self-pay | Admitting: Psychiatry

## 2021-06-14 ENCOUNTER — Other Ambulatory Visit: Payer: Self-pay

## 2021-06-14 ENCOUNTER — Telehealth (INDEPENDENT_AMBULATORY_CARE_PROVIDER_SITE_OTHER): Payer: BC Managed Care – PPO | Admitting: Psychiatry

## 2021-06-14 DIAGNOSIS — F411 Generalized anxiety disorder: Secondary | ICD-10-CM

## 2021-06-14 DIAGNOSIS — F900 Attention-deficit hyperactivity disorder, predominantly inattentive type: Secondary | ICD-10-CM | POA: Diagnosis not present

## 2021-06-14 DIAGNOSIS — F429 Obsessive-compulsive disorder, unspecified: Secondary | ICD-10-CM | POA: Diagnosis not present

## 2021-06-14 DIAGNOSIS — F331 Major depressive disorder, recurrent, moderate: Secondary | ICD-10-CM

## 2021-06-14 DIAGNOSIS — F5101 Primary insomnia: Secondary | ICD-10-CM

## 2021-06-14 MED ORDER — BUSPIRONE HCL 15 MG PO TABS: 15.0000 mg | ORAL_TABLET | Freq: Two times a day (BID) | ORAL | 0 refills | Status: DC

## 2021-06-14 MED ORDER — METHYLPHENIDATE HCL ER 18 MG PO TB24: 18.0000 mg | ORAL_TABLET | Freq: Every day | ORAL | 0 refills | Status: DC

## 2021-06-14 NOTE — Progress Notes (Signed)
Virtual Visit via Video Note  I connected with Jeremy Boyer on 06/14/21 at  2:40 PM EDT by a video enabled telemedicine application and verified that I am speaking with the correct person using two identifiers.  Location Provider Location : [facility] Patient Location : Car  Participants: Patient , Provider   I discussed the limitations of evaluation and management by telemedicine and the availability of in person appointments. The patient expressed understanding and agreed to proceed.    I discussed the assessment and treatment plan with the patient. The patient was provided an opportunity to ask questions and all were answered. The patient agreed with the plan and demonstrated an understanding of the instructions.   The patient was advised to call back or seek an in-person evaluation if the symptoms worsen or if the condition fails to improve as anticipated.   [place] MD OP Progress Note  06/14/2021 2:59 PM Jeremy Boyer  MRN:  [id number]  Chief Complaint:  Chief Complaint   Follow-up; Depression; Anxiety; Insomnia    HPI: Jeremy Boyer is a 50 year old Caucasian male, married, employed, lives in [place], has a history of MDD, OCD, ADHD, hypertension was evaluated by telemedicine today.  Patient today reports he is currently making progress with regards to his mood.  He reports he is currently on a schedule since he has been working again.  And that does help him to regulate his mood symptoms better.  Patient reports he started taking the Sonata since he had sleep problems.  He tried the 15 mg and that seems to be helping.  Patient denies any suicidality, homicidality or perceptual disturbances.  Patient reports he does have tremors of his upper extremities, ongoing since the past 2 years, gets worse when he is anxious or under stress.  This was also observed by his mother.  Patient unable to elaborate which medication could be contributing.  He is not interested in  making any changes with his medications.  He is not interested in neurology referral at this point and wants to wait and watch.  He was able to get in touch with [facility] contact person and is currently trying to get it approved by his health insurance plan.  Patient denies any other concerns today.  Visit Diagnosis:    ICD-10-CM   1. MDD (major depressive disorder), recurrent episode, moderate (HCC)  F33.1    Improving    2. 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 Attention deficit hyperactivity disorder (ADHD), predominantly inattentive type  F90.0 methylphenidate 18 MG PO CR tablet    4. GAD (generalized anxiety disorder)  F41.1     5. Primary insomnia  F51.01       Past Psychiatric History: Reviewed past psychiatric history from progress note on 10/23/2018.  Past trials of Paxil, Abilify, Trintellix, Depakote, mirtazapine, risperidone, duloxetine, Seroquel, Ambien  Past Medical History:  Past Medical History:  Diagnosis Date   Anxiety and depression 12/01/2015   Bipolar disorder ([place])    Cholecystitis    Hypertension 07/05/2017   Insomnia 07/05/2017   MDD (major depressive disorder), recurrent episode, moderate ([place]) 01/13/2016   OCD (obsessive compulsive disorder) 01/13/2016    Past Surgical History:  Procedure Laterality Date   CHOLECYSTECTOMY N/A 07/10/2017   Procedure: LAPAROSCOPIC CHOLECYSTECTOMY;  Surgeon: Vickie Epley, MD;  Location: [facility] ORS;  Service: General;  Laterality: N/A;   VASECTOMY     VASECTOMY REVERSAL  Family Psychiatric History: Reviewed family psychiatric history from progress note on 10/23/2018  Family History:  Family History  Problem Relation Age of Onset   Anxiety disorder Mother    Depression Mother    Alcohol abuse Father    Anxiety disorder Sister    Depression Sister     Social History: Reviewed social history from progress note on 10/23/2018 Social History    Socioeconomic History   Marital status: Married    Spouse name: nancy   Number of children: 3   Years of education: Not on file   Highest education level: Bachelor's degree (e.g., BA, AB, BS)  Occupational History   Not on file  Tobacco Use   Smoking status: Never   Smokeless tobacco: Never  Vaping Use   Vaping Use: Never used  Substance and Sexual Activity   Alcohol use: Yes    Alcohol/week: 23.0 standard drinks    Types: 2 Glasses of wine, 14 Cans of beer, 7 Shots of liquor per week   Drug use: No   Sexual activity: Yes    Partners: Female    Birth control/protection: None  Other Topics Concern   Not on file  Social History Narrative   Not on file   Social Determinants of Health   Financial Resource Strain: Not on file  Food Insecurity: Not on file  Transportation Needs: Not on file  Physical Activity: Not on file  Stress: Not on file  Social Connections: Not on file    Allergies: No Known Allergies  Metabolic Disorder Labs: No results found for: HGBA1C, MPG No results found for: PROLACTIN No results found for: CHOL, TRIG, HDL, CHOLHDL, VLDL, LDLCALC Lab Results  Component Value Date   TSH 3.980 10/23/2018    Therapeutic Level Labs: No results found for: LITHIUM No results found for: VALPROATE No components found for:  CBMZ  Current Medications: Current Outpatient Medications  Medication Sig Dispense Refill   amoxicillin-clavulanate (AUGMENTIN) 875-125 MG tablet Take 1 tablet by mouth 2 (two) times daily.     BINAXNOW COVID-19 AG HOME TEST KIT TEST AS DIRECTED TODAY     bisoprolol-hydrochlorothiazide (ZIAC) 10-6.25 MG tablet Take 1 tablet by mouth daily.     bisoprolol-hydrochlorothiazide (ZIAC) 10-6.25 MG tablet Take by mouth.     busPIRone (BUSPAR) 15 MG tablet Take 1 tablet (15 mg total) by mouth 2 (two) times daily. 180 tablet 0   clonazePAM (KLONOPIN) 0.25 MG disintegrating tablet Take 1 tablet (0.25 mg total) by mouth 2 (two) times daily as  needed. For anxiety attacks 60 tablet 1   cyclobenzaprine (FLEXERIL) 10 MG tablet Take 10 mg by mouth 3 (three) times daily as needed for muscle spasms.      dicyclomine (BENTYL) 20 MG tablet Take 20 mg by mouth every 6 (six) hours as needed.     dicyclomine (BENTYL) 20 MG tablet Can take every six hours as needed for abdominal cramping     methylphenidate 18 MG PO CR tablet Take 1 tablet (18 mg total) by mouth every morning. 30 tablet 0   methylphenidate 18 MG PO CR tablet Take 1 tablet (18 mg total) by mouth daily. 30 tablet 0   omeprazole (PRILOSEC) 20 MG capsule Take 20 mg by mouth daily.     omeprazole (PRILOSEC) 20 MG capsule Take by mouth.     rosuvastatin (CRESTOR) 10 MG tablet Take 10 mg by mouth at bedtime.     valACYclovir (VALTREX) 500 MG tablet TAKE 1 TABLET BY MOUTH  ONCE DAILY     vortioxetine HBr (TRINTELLIX) 20 MG TABS tablet Take 1 tablet (20 mg total) by mouth daily. 90 tablet 1   zaleplon (SONATA) 10 MG capsule Take 1 capsule (10 mg total) by mouth at bedtime as needed for sleep. 30 capsule 1   zaleplon (SONATA) 5 MG capsule Take 1 capsule (5 mg total) by mouth at bedtime as needed for sleep. Take along with 10 mg at bedtime as needed 30 capsule 1   No current facility-administered medications for this visit.     Musculoskeletal: Strength & Muscle Tone:  UTA Gait & Station:  Seated Patient leans: N/A  Psychiatric Specialty Exam: Review of Systems  Neurological:  Positive for tremors.  Psychiatric/Behavioral:  Positive for dysphoric mood and sleep disturbance. The patient is nervous/anxious.   All other systems reviewed and are negative.  There were no vitals taken for this visit.There is no height or weight on file to calculate BMI.  General Appearance: Casual  Eye Contact:  Fair  Speech:  Clear and Coherent  Volume:  Normal  Mood:  Anxious and Depressed improving  Affect:  Congruent  Thought Process:  Goal Directed and Descriptions of Associations: Intact   Orientation:  Full (Time, Place, and Person)  Thought Content: Logical   Suicidal Thoughts:  No  Homicidal Thoughts:  No  Memory:  Immediate;   Fair Recent;   Fair Remote;   Fair  Judgement:  Fair  Insight:  Fair  Psychomotor Activity:  tremor  Concentration:  Concentration: Fair and Attention Span: Fair  Recall:  [employer] of Knowledge: Fair  Language: Fair  Akathisia:  No  Handed:  Right  AIMS (if indicated): not done  Assets:  Communication Skills Desire for Improvement Housing Social Support  ADL's:  Intact  Cognition: WNL  Sleep:  improving   Screenings: GAD-7    Flowsheet Row Video Visit from 04/21/2021 in [facility] Office Visit from 03/12/2021 in [facility] [place] Video Visit from 01/22/2021 in [facility] Video Visit from 09/21/2020 in [place] [facility]  Total GAD-7 Score _0 PHQ2-9    Flowsheet Row Video Visit from 04/21/2021 in [place] Office Visit from 03/12/2021 in [place] Video Visit from 01/22/2021 in [place]  PHQ-2 Total Score _1 PHQ-9 Total Score 8 10 --      Flowsheet Row Video Visit from 04/21/2021 in [place] Office Visit from 03/12/2021 in [place] No Risk Low Risk        Assessment and Plan: Tal Kempker is a 50 year old Caucasian male, employed, lives in [place], has a history of MDD, OCD, ADHD, insomnia was evaluated by telemedicine today.  Patient is currently improving on the current medication regimen although does have possible adverse side effects to medications, likely tremors medication induced, will benefit from the following plan.  Plan MDD-improving Trintellix 20 mg p.o. daily Klonopin 0.25 mg p.o. twice daily as needed for  severe anxiety attacks BuSpar as prescribed Patient referred for TMS-pending  OCD-stable Continue CBT as needed  ADHD-stable Concerta 18 mg p.o. daily Reviewed [place] PMP AWARE.  Primary insomnia-improving Sonata 15 mg p.o. nightly as needed Sleep hygiene techniques  GAD-improving BuSpar Trintellix as prescribed  Tremor-unstable Discussed referral to neurology.  Patient is not interested at this time Discussed reducing the dosage of  medications like BuSpar.  Patient to monitor his symptoms closely and will let writer know.  He is not interested in changing medication dosages at this time.  Genesight testing results were reviewed while making medication changes  Follow-up in clinic in 1 month in person.  This note was generated in part or whole with voice recognition software. Voice recognition is usually quite accurate but there are transcription errors that can and very often do occur. I apologize for any typographical errors that were not detected and corrected.       Ursula Alert, MD 06/15/2021, 10:04 AM

## 2021-07-20 ENCOUNTER — Ambulatory Visit (INDEPENDENT_AMBULATORY_CARE_PROVIDER_SITE_OTHER): Payer: BC Managed Care – PPO | Admitting: Psychiatry

## 2021-07-20 ENCOUNTER — Encounter: Payer: Self-pay | Admitting: Psychiatry

## 2021-07-20 ENCOUNTER — Other Ambulatory Visit: Payer: Self-pay

## 2021-07-20 VITALS — BP 186/105 | HR 79 | Temp 98.1°F | Wt 158.4 lb

## 2021-07-20 DIAGNOSIS — F411 Generalized anxiety disorder: Secondary | ICD-10-CM | POA: Diagnosis not present

## 2021-07-20 DIAGNOSIS — R030 Elevated blood-pressure reading, without diagnosis of hypertension: Secondary | ICD-10-CM

## 2021-07-20 DIAGNOSIS — F429 Obsessive-compulsive disorder, unspecified: Secondary | ICD-10-CM

## 2021-07-20 DIAGNOSIS — F900 Attention-deficit hyperactivity disorder, predominantly inattentive type: Secondary | ICD-10-CM

## 2021-07-20 DIAGNOSIS — F331 Major depressive disorder, recurrent, moderate: Secondary | ICD-10-CM | POA: Diagnosis not present

## 2021-07-20 DIAGNOSIS — F5101 Primary insomnia: Secondary | ICD-10-CM

## 2021-07-20 MED ORDER — ZALEPLON 10 MG PO CAPS: 10.0000 mg | ORAL_CAPSULE | Freq: Every evening | ORAL | 1 refills | Status: DC | PRN

## 2021-07-20 MED ORDER — ZALEPLON 5 MG PO CAPS: 5.0000 mg | ORAL_CAPSULE | Freq: Every evening | ORAL | 1 refills | Status: DC | PRN

## 2021-07-20 NOTE — Progress Notes (Signed)
BH MD OP Progress Note  07/20/2021 5:03 PM Jeremy Boyer  MRN:  [id number]  Chief Complaint:  Chief Complaint   Follow-up    HPI: Jeremy Boyer is a 50 year old Caucasian male, married, employed, lives in [place], has a history of MDD, OCD, ADHD, hypertension was evaluated in office today.  Patient today reports since he is back at school and is currently having a routine he is able to cope better with his mood.  He however continues to have depressive symptoms although improving.  Patient reports even when he is on medications he always feels his mood is low all the time.  He has never felt his mood to be at a place where he feels he is enjoying his day-to-day life.  Although he is able to function in his life he feels he wants to feel better and enjoy his life better.  He hence wants to pursue TMS.  He did reach out to TMS contact person .  He however is trying to find the right time with his schedule to start this treatment.  Patient reports sleep is overall okay.  He does take Sonata which helps.  Patient with attention and concentration deficit currently on Concerta, reports he is compliant on it.  Patient with elevated blood pressure reading in session today, blood pressure was repeated.  Patient denies any suicidality, homicidality or perceptual disturbances.  Patient denies any other concerns today.  Visit Diagnosis:    ICD-10-CM   1. MDD (major depressive disorder), recurrent episode, moderate (HCC)  F33.1 TSH    2. 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 Attention deficit hyperactivity disorder (ADHD), predominantly inattentive type  F90.0     4. GAD (generalized anxiety disorder)  F41.1     5. Primary insomnia  F51.01 zaleplon (SONATA) 5 MG capsule    zaleplon (SONATA) 10 MG capsule    6. Elevated blood pressure reading  R03.0       Past Psychiatric History: Reviewed past psychiatric history from progress note on  10/23/2018.  Past trials of Paxil, Abilify, Trintellix, Depakote, mirtazapine, risperidone, duloxetine, Seroquel, Ambien.  Past Medical History:  Past Medical History:  Diagnosis Date   Anxiety and depression 12/01/2015   Bipolar disorder (HCC)    Cholecystitis    Hypertension 07/05/2017   Insomnia 07/05/2017   MDD (major depressive disorder), recurrent episode, moderate (HCC) 01/13/2016   OCD (obsessive compulsive disorder) 01/13/2016    Past Surgical History:  Procedure Laterality Date   CHOLECYSTECTOMY N/A 07/10/2017   Procedure: LAPAROSCOPIC CHOLECYSTECTOMY;  Surgeon: Ancil Linsey, MD;  Location: [facility] ORS;  Service: General;  Laterality: N/A;   VASECTOMY     VASECTOMY REVERSAL      Family Psychiatric History: Reviewed family psychiatric history from progress note on 10/23/2018  Family History:  Family History  Problem Relation Age of Onset   Anxiety disorder Mother    Depression Mother    Alcohol abuse Father    Anxiety disorder Sister    Depression Sister     Social History: Reviewed social history from progress note on 10/23/2018 Social History   Socioeconomic History   Marital status: Married    Spouse name: nancy   Number of children: 3   Years of education: Not on file   Highest education level: Bachelor's degree (e.g., BA, AB, BS)  Occupational History   Not on file  Tobacco Use   Smoking status: Never   Smokeless tobacco:  Never  Vaping Use   Vaping Use: Never used  Substance and Sexual Activity   Alcohol use: Yes    Alcohol/week: 23.0 standard drinks    Types: 2 Glasses of wine, 14 Cans of beer, 7 Shots of liquor per week   Drug use: No   Sexual activity: Yes    Partners: Female    Birth control/protection: None  Other Topics Concern   Not on file  Social History Narrative   Not on file   Social Determinants of Health   Financial Resource Strain: Not on file  Food Insecurity: Not on file  Transportation Needs: Not on file  Physical Activity:  Not on file  Stress: Not on file  Social Connections: Not on file    Allergies: No Known Allergies  Metabolic Disorder Labs: No results found for: HGBA1C, MPG No results found for: PROLACTIN No results found for: CHOL, TRIG, HDL, CHOLHDL, VLDL, LDLCALC Lab Results  Component Value Date   TSH 3.980 10/23/2018    Therapeutic Level Labs: No results found for: LITHIUM No results found for: VALPROATE No components found for:  CBMZ  Current Medications: Current Outpatient Medications  Medication Sig Dispense Refill   busPIRone (BUSPAR) 15 MG tablet Take 1 tablet (15 mg total) by mouth 2 (two) times daily. 180 tablet 0   clonazePAM (KLONOPIN) 0.25 MG disintegrating tablet Take 1 tablet (0.25 mg total) by mouth 2 (two) times daily as needed. For anxiety attacks 60 tablet 1   cyclobenzaprine (FLEXERIL) 10 MG tablet Take 10 mg by mouth 3 (three) times daily as needed for muscle spasms.      Hyoscyamine Sulfate SL 0.125 MG SUBL Place under the tongue.     rosuvastatin (CRESTOR) 10 MG tablet Take 10 mg by mouth at bedtime.     valACYclovir (VALTREX) 500 MG tablet TAKE 1 TABLET BY MOUTH ONCE DAILY     vortioxetine HBr (TRINTELLIX) 20 MG TABS tablet Take 1 tablet (20 mg total) by mouth daily. 90 tablet 1   omeprazole (PRILOSEC) 20 MG capsule Take by mouth.     zaleplon (SONATA) 10 MG capsule Take 1 capsule (10 mg total) by mouth at bedtime as needed for sleep. 30 capsule 1   zaleplon (SONATA) 5 MG capsule Take 1 capsule (5 mg total) by mouth at bedtime as needed for sleep. Take along with 10 mg at bedtime as needed 30 capsule 1   No current facility-administered medications for this visit.     Musculoskeletal: Strength & Muscle Tone: within normal limits Gait & Station: normal Patient leans: N/A  Psychiatric Specialty Exam: Review of Systems  Psychiatric/Behavioral:  Positive for dysphoric mood.   All other systems reviewed and are negative.  Blood pressure (!) 186/105, pulse 79,  temperature 98.1 F (36.7 C), temperature source Temporal, weight 158 lb 6.4 oz (71.8 kg).Body mass index is 22.73 kg/m.  General Appearance: Casual  Eye Contact:  Fair  Speech:  Normal Rate  Volume:  Normal  Mood:  Dysphoric  Affect:  Depressed  Thought Process:  Goal Directed and Descriptions of Associations: Intact  Orientation:  Full (Time, Place, and Person)  Thought Content: Logical   Suicidal Thoughts:  No  Homicidal Thoughts:  No  Memory:  Immediate;   Fair Recent;   Fair Remote;   Fair  Judgement:  Fair  Insight:  Fair  Psychomotor Activity:  Normal  Concentration:  Concentration: Fair and Attention Span: Fair  Recall:  [employer] of Knowledge: Fair  Language: Fair  Akathisia:  No  Handed:  Right  AIMS (if indicated): done  Assets:  Communication Skills Desire for Improvement Housing Intimacy Social Support Talents/Skills Transportation Vocational/Educational  ADL's:  Intact  Cognition: WNL  Sleep:  Fair   Screenings: GAD-7    Flowsheet Row Video Visit from 04/21/2021 in [facility] Office Visit from 03/12/2021 in [facility] Video Visit from 01/22/2021 in [facility] Video Visit from 09/21/2020 in [facility]  Total GAD-7 Score 9 13 12 5       PHQ2-9    Flowsheet Row Office Visit from 07/20/2021 in [facility] Video Visit from 04/21/2021 in [facility] Office Visit from 03/12/2021 in [facility] Video Visit from 01/22/2021 in [facility]  PHQ-2 Total Score 4 4 3 1   PHQ-9 Total Score 8 8 10  --      Flowsheet Row Office Visit from 07/20/2021 in [facility] Video Visit from 04/21/2021 in [facility] Office Visit from 03/12/2021 in [facility]  C-SSRS  RISK CATEGORY Error: Q3, 4, or 5 should not be populated when Q2 is No No Risk Low Risk        Assessment and Plan: Jeremy Boyer is a 50 year old Caucasian male, employed, lives in [place], has a history of MDD, OCD, ADHD, insomnia was evaluated in office today.  Patient reports although he has been making progress with regards to his mood and sleep on the current medication regimen he continues to stay with a low mood all the time and hence is interested in pursuing TMS.  Patient with elevated blood pressure reading today in session will benefit from the following plan.  Plan MDD-improving Trintellix 20 mg p.o. daily Klonopin 0.25 mg p.o. twice daily as needed for severe anxiety attacks BuSpar as prescribed Patient has been referred for TMS-pending  OCD-stable Continue CBT as needed  ADHD-stable However since patient has elevated blood pressure reading in session, we will discontinue Concerta for now.  Primary insomnia-stable Sonata 15 mg p.o. nightly as needed   GAD-improving BuSpar and Trintellix as prescribed  Elevated blood pressure reading-unstable Patient reports he ran out of his antihypertensive medication.  Patient agrees to get in touch with his primary care provider to get back on his medication.  Once he is compliant on his antihypertensive we will reevaluate his blood pressure and reassess the need for restarting a stimulant medication.  This was discussed with patient.  He agrees with plan.  Genesight testing results were reviewed while making medication changes  Follow-up in clinic in 3 to 4 weeks or sooner in office.  This note was generated in part or whole with voice recognition software. Voice recognition is usually quite accurate but there are transcription errors that can and very often do occur. I apologize for any typographical errors that were not detected and corrected.     [name], MD 07/21/2021, 5:31 PM

## 2021-07-21 ENCOUNTER — Encounter (INDEPENDENT_AMBULATORY_CARE_PROVIDER_SITE_OTHER): Payer: Self-pay

## 2021-08-12 ENCOUNTER — Other Ambulatory Visit: Payer: Self-pay

## 2021-08-12 ENCOUNTER — Encounter: Payer: Self-pay | Admitting: Psychiatry

## 2021-08-12 ENCOUNTER — Ambulatory Visit (INDEPENDENT_AMBULATORY_CARE_PROVIDER_SITE_OTHER): Payer: BC Managed Care – PPO | Admitting: Psychiatry

## 2021-08-12 VITALS — BP 155/96 | HR 73 | Temp 98.3°F

## 2021-08-12 DIAGNOSIS — F411 Generalized anxiety disorder: Secondary | ICD-10-CM

## 2021-08-12 DIAGNOSIS — F900 Attention-deficit hyperactivity disorder, predominantly inattentive type: Secondary | ICD-10-CM | POA: Diagnosis not present

## 2021-08-12 DIAGNOSIS — F429 Obsessive-compulsive disorder, unspecified: Secondary | ICD-10-CM

## 2021-08-12 DIAGNOSIS — R030 Elevated blood-pressure reading, without diagnosis of hypertension: Secondary | ICD-10-CM

## 2021-08-12 DIAGNOSIS — F331 Major depressive disorder, recurrent, moderate: Secondary | ICD-10-CM | POA: Diagnosis not present

## 2021-08-12 DIAGNOSIS — F5101 Primary insomnia: Secondary | ICD-10-CM

## 2021-08-12 MED ORDER — ZOLPIDEM TARTRATE ER 12.5 MG PO TBCR: 12.5000 mg | EXTENDED_RELEASE_TABLET | Freq: Every evening | ORAL | 0 refills | Status: DC | PRN

## 2021-08-12 NOTE — Progress Notes (Signed)
BH MD OP Progress Note  08/12/2021 4:16 PM Jeremy Boyer  MRN:  [id number]  Chief Complaint:  Chief Complaint   Follow-up; Insomnia    HPI: Jeremy Boyer is a 50 year old Caucasian male, married, employed, lives in [place], has a history of MDD, OCD, ADHD, hypertension, GAD, primary insomnia, was evaluated in office today.  Patient today reports he had 2 to 3 days couple of weeks ago over the weekend when he felt depressed, unmotivated and just stayed on the couch without doing anything.  However he was able to come out of it and has not felt that way since then.  Patient reports however he continues to struggle with sleep.  He goes to bed at around 9 or 9:30 PM, he has no difficulty falling asleep however he wakes up at around 1:30 AM 2-3 times a week.  Most of the time when he wakes up around that time he is unable to fall back asleep.  He has taken clonazepam as needed which may have helped here and there but not much.  Patient reports he would like to go back to Ambien extended release which helped him in the past.  Patient denies any suicidality, homicidality or perceptual disturbances.  Patient with elevated blood pressure reading in session today, had an evaluation at his primary care doctor's office-Dr. Hedrick-dated 07/22/2021-blood pressure that day was 134/86.  Patient is currently back on bisoprolol-hydrochlorothiazide.  Patient is no longer on Concerta.  Patient denies any other concerns today.  Visit Diagnosis:    ICD-10-CM   1. MDD (major depressive disorder), recurrent episode, moderate (HCC)  F33.1     2. 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 Attention deficit hyperactivity disorder (ADHD), predominantly inattentive type  F90.0     4. GAD (generalized anxiety disorder)  F41.1     5. Primary insomnia  F51.01 zolpidem (AMBIEN CR) 12.5 MG CR tablet    6. Elevated blood pressure reading  R03.0       Past Psychiatric  History: Reviewed past psychiatric history from progress note on 10/23/2018.  Past trials of Paxil, Abilify, Trintellix, Depakote, mirtazapine, risperidone, duloxetine, Seroquel, Ambien,Sonata  Past Medical History:  Past Medical History:  Diagnosis Date   Anxiety and depression 12/01/2015   Bipolar disorder (HCC)    Cholecystitis    Hypertension 07/05/2017   Insomnia 07/05/2017   MDD (major depressive disorder), recurrent episode, moderate (HCC) 01/13/2016   OCD (obsessive compulsive disorder) 01/13/2016    Past Surgical History:  Procedure Laterality Date   CHOLECYSTECTOMY N/A 07/10/2017   Procedure: LAPAROSCOPIC CHOLECYSTECTOMY;  Surgeon: Ancil Linsey, MD;  Location: [facility] ORS;  Service: General;  Laterality: N/A;   VASECTOMY     VASECTOMY REVERSAL      Family Psychiatric History: Reviewed family psychiatric history from progress note on 09/23/2019  Family History:  Family History  Problem Relation Age of Onset   Anxiety disorder Mother    Depression Mother    Alcohol abuse Father    Anxiety disorder Sister    Depression Sister     Social History: Reviewed social history from progress note on 09/23/2019 Social History   Socioeconomic History   Marital status: Married    Spouse name: nancy   Number of children: 3   Years of education: Not on file   Highest education level: Bachelor's degree (e.g., BA, AB, BS)  Occupational History   Not on file  Tobacco Use   Smoking  status: Never   Smokeless tobacco: Never  Vaping Use   Vaping Use: Never used  Substance and Sexual Activity   Alcohol use: Yes    Alcohol/week: 23.0 standard drinks    Types: 2 Glasses of wine, 14 Cans of beer, 7 Shots of liquor per week   Drug use: No   Sexual activity: Yes    Partners: Female    Birth control/protection: None  Other Topics Concern   Not on file  Social History Narrative   Not on file   Social Determinants of Health   Financial Resource Strain: Not on file  Food  Insecurity: Not on file  Transportation Needs: Not on file  Physical Activity: Not on file  Stress: Not on file  Social Connections: Not on file    Allergies: No Known Allergies  Metabolic Disorder Labs: No results found for: HGBA1C, MPG No results found for: PROLACTIN No results found for: CHOL, TRIG, HDL, CHOLHDL, VLDL, LDLCALC Lab Results  Component Value Date   TSH 3.980 10/23/2018    Therapeutic Level Labs: No results found for: LITHIUM No results found for: VALPROATE No components found for:  CBMZ  Current Medications: Current Outpatient Medications  Medication Sig Dispense Refill   busPIRone (BUSPAR) 15 MG tablet Take 1 tablet (15 mg total) by mouth 2 (two) times daily. 180 tablet 0   clonazePAM (KLONOPIN) 0.25 MG disintegrating tablet Take 1 tablet (0.25 mg total) by mouth 2 (two) times daily as needed. For anxiety attacks 60 tablet 1   cyclobenzaprine (FLEXERIL) 10 MG tablet Take 10 mg by mouth 3 (three) times daily as needed for muscle spasms.      Hyoscyamine Sulfate SL 0.125 MG SUBL Place under the tongue.     rosuvastatin (CRESTOR) 10 MG tablet Take 10 mg by mouth at bedtime.     valACYclovir (VALTREX) 500 MG tablet TAKE 1 TABLET BY MOUTH ONCE DAILY     vortioxetine HBr (TRINTELLIX) 20 MG TABS tablet Take 1 tablet (20 mg total) by mouth daily. 90 tablet 1   zolpidem (AMBIEN CR) 12.5 MG CR tablet Take 1 tablet (12.5 mg total) by mouth at bedtime as needed for sleep. Stop Zaleplon 90 tablet 0   omeprazole (PRILOSEC) 20 MG capsule Take by mouth.     No current facility-administered medications for this visit.     Musculoskeletal: Strength & Muscle Tone: within normal limits Gait & Station: normal Patient leans: N/A  Psychiatric Specialty Exam: Review of Systems  Psychiatric/Behavioral:  Positive for dysphoric mood and sleep disturbance.   All other systems reviewed and are negative.  Blood pressure (!) 155/96, pulse 73, temperature 98.3 F (36.8 C),  temperature source Temporal.There is no height or weight on file to calculate BMI.  General Appearance: Casual  Eye Contact:  Good  Speech:  Clear and Coherent  Volume:  Normal  Mood:  Dysphoric  Affect:  Congruent  Thought Process:  Goal Directed and Descriptions of Associations: Intact  Orientation:  Full (Time, Place, and Person)  Thought Content: Logical   Suicidal Thoughts:  No  Homicidal Thoughts:  No  Memory:  Immediate;   Fair Recent;   Fair Remote;   Fair  Judgement:  Fair  Insight:  Fair  Psychomotor Activity:  Normal  Concentration:  Concentration: Fair and Attention Span: Fair  Recall:  [employer] of Knowledge: Fair  Language: Fair  Akathisia:  No  Handed:  Right  AIMS (if indicated): done  Assets:  Communication Skills Desire  for Improvement Housing Social Support  ADL's:  Intact  Cognition: WNL  Sleep:  Poor   Screenings: GAD-7    Flowsheet Row Video Visit from 04/21/2021 in [facility] Office Visit from 03/12/2021 in [facility] Video Visit from 01/22/2021 in [facility] Video Visit from 09/21/2020 in [facility]  Total GAD-7 Score 9 13 12 5       PHQ2-9    Flowsheet Row Office Visit from 08/12/2021 in [facility] Office Visit from 07/20/2021 in [facility] Video Visit from 04/21/2021 in [facility] Office Visit from 03/12/2021 in [facility] Video Visit from 01/22/2021 in [facility]  PHQ-2 Total Score 2 4 4 3 1   PHQ-9 Total Score 7 8 8 10  --      Flowsheet Row Office Visit from 08/12/2021 in [facility] Office Visit from 07/20/2021 in [facility] Video Visit from 04/21/2021 in [facility]  C-SSRS RISK CATEGORY Error:  Q3, 4, or 5 should not be populated when Q2 is No Error: Q3, 4, or 5 should not be populated when Q2 is No No Risk        Assessment and Plan: Jeremy Boyer is a 50 year old Caucasian male, employed, lives in [place], has a history of MDD, OCD, ADHD, insomnia was evaluated in office today.  Patient is currently struggling with sleep, and continues to have depression symptoms although improved in the past 2 weeks.  Patient continues to be interested in TMS.  Discussed plan as noted below.  Plan MDD-improving Trintellix 20 mg p.o. daily Klonopin 0.25 mg p.o. twice daily as needed for severe anxiety attacks BuSpar 15 mg p.o. twice daily Patient is interested in TMS-pending.  He is currently connected with TMS clinic.  OCD-stable Continue CBT as needed  ADHD-stable Will not make any medication changes today.  He is currently not taking Concerta.  Primary insomnia-unstable Discontinue Sonata. Start Ambien CR 12.5 mg p.o. nightly  GAD-improving BuSpar and Trintellix as prescribed  Elevated blood pressure reading-unstable I have reviewed notes per Dr. [name] 07/22/2021.  Patient had 2 readings of elevated blood pressure in session today.  Patient to follow up with primary care provider.  We will coordinate care.  Follow-up in clinic in 4 weeks or sooner in person.  This note was generated in part or whole with voice recognition software. Voice recognition is usually quite accurate but there are transcription errors that can and very often do occur. I apologize for any typographical errors that were not detected and corrected.      [place], MD 08/12/2021, 4:16 PM

## 2021-09-14 ENCOUNTER — Encounter: Payer: Self-pay | Admitting: Psychiatry

## 2021-09-14 ENCOUNTER — Other Ambulatory Visit: Payer: Self-pay

## 2021-09-14 ENCOUNTER — Ambulatory Visit (INDEPENDENT_AMBULATORY_CARE_PROVIDER_SITE_OTHER): Payer: BC Managed Care – PPO | Admitting: Psychiatry

## 2021-09-14 VITALS — BP 161/94 | HR 72 | Temp 98.3°F | Wt 200.6 lb

## 2021-09-14 DIAGNOSIS — F900 Attention-deficit hyperactivity disorder, predominantly inattentive type: Secondary | ICD-10-CM

## 2021-09-14 DIAGNOSIS — F429 Obsessive-compulsive disorder, unspecified: Secondary | ICD-10-CM

## 2021-09-14 DIAGNOSIS — F5101 Primary insomnia: Secondary | ICD-10-CM

## 2021-09-14 DIAGNOSIS — R030 Elevated blood-pressure reading, without diagnosis of hypertension: Secondary | ICD-10-CM

## 2021-09-14 DIAGNOSIS — F411 Generalized anxiety disorder: Secondary | ICD-10-CM | POA: Diagnosis not present

## 2021-09-14 DIAGNOSIS — F3341 Major depressive disorder, recurrent, in partial remission: Secondary | ICD-10-CM | POA: Diagnosis not present

## 2021-09-14 MED ORDER — BUSPIRONE HCL 15 MG PO TABS: 15.0000 mg | ORAL_TABLET | Freq: Two times a day (BID) | ORAL | 0 refills | Status: DC

## 2021-09-14 NOTE — Progress Notes (Signed)
BH MD OP Progress Note  09/14/2021 4:50 PM Jeremy Boyer  MRN:  [id number]  Chief Complaint:  Chief Complaint   Follow-up; Anxiety; Depression    HPI: Jeremy Boyer is a 50 year old Caucasian male, married, employed, lives in [place], has a history of MDD, OCD, ADHD, hypertension, GAD, primary insomnia was evaluated in office today.  Patient today reports sleep is improved since being on the Ambien.  That has helped a lot with his mood during the day.  Patient reports he feels much better compared to how he was doing few weeks ago.  His sadness has improved.  He does report anxiety is under control as well.  He has been compliant on the BuSpar, Trintellix.  Denies side effects.  Patient reports he looks forward to the holiday season, he is going to be on a break from work for 10 days.  Patient with elevated blood pressure reading in session today, I have reviewed notes per Dr. Esmond Camper 08/25/2021.  Patient is currently back on bisoprolol-hydrochlorothiazide.  Patient also reports he is managing his diet, cooking, staying active.  Patient denies any suicidality, homicidality or perceptual disturbances.  Patient denies any other concerns today.    Visit Diagnosis:    ICD-10-CM   1. MDD (major depressive disorder), recurrent, in partial remission (HCC)  F33.41     2. 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 Attention deficit hyperactivity disorder (ADHD), predominantly inattentive type  F90.0     4. GAD (generalized anxiety disorder)  F41.1     5. Primary insomnia  F51.01     6. Elevated blood pressure reading  R03.0       Past Psychiatric History: Reviewed past psychiatric history from progress note on 10/23/2018.  Past trials of Paxil, Abilify, Trintellix, Depakote, mirtazapine, risperidone, duloxetine, Seroquel, Ambien, Sonata  Past Medical History:  Past Medical History:  Diagnosis Date    Anxiety and depression 12/01/2015   Bipolar disorder (HCC)    Cholecystitis    Hypertension 07/05/2017   Insomnia 07/05/2017   MDD (major depressive disorder), recurrent episode, moderate (HCC) 01/13/2016   OCD (obsessive compulsive disorder) 01/13/2016    Past Surgical History:  Procedure Laterality Date   CHOLECYSTECTOMY N/A 07/10/2017   Procedure: LAPAROSCOPIC CHOLECYSTECTOMY;  Surgeon: Ancil Linsey, MD;  Location: [facility] ORS;  Service: General;  Laterality: N/A;   VASECTOMY     VASECTOMY REVERSAL      Family Psychiatric History: Reviewed family psychiatric history from progress note on 10/23/2018  Family History:  Family History  Problem Relation Age of Onset   Anxiety disorder Mother    Depression Mother    Alcohol abuse Father    Anxiety disorder Sister    Depression Sister     Social History: Reviewed social history from progress note on 10/23/2018 Social History   Socioeconomic History   Marital status: Married    Spouse name: nancy   Number of children: 3   Years of education: Not on file   Highest education level: Bachelor's degree (e.g., BA, AB, BS)  Occupational History   Not on file  Tobacco Use   Smoking status: Never   Smokeless tobacco: Never  Vaping Use   Vaping Use: Never used  Substance and Sexual Activity   Alcohol use: Yes    Alcohol/week: 23.0 standard drinks    Types: 2 Glasses of wine, 14 Cans of beer, 7 Shots of liquor per week  Drug use: No   Sexual activity: Yes    Partners: Female    Birth control/protection: None  Other Topics Concern   Not on file  Social History Narrative   Not on file   Social Determinants of Health   Financial Resource Strain: Not on file  Food Insecurity: Not on file  Transportation Needs: Not on file  Physical Activity: Not on file  Stress: Not on file  Social Connections: Not on file    Allergies: No Known Allergies  Metabolic Disorder Labs: No results found for: HGBA1C, MPG No results found for:  PROLACTIN No results found for: CHOL, TRIG, HDL, CHOLHDL, VLDL, LDLCALC Lab Results  Component Value Date   TSH 3.980 10/23/2018    Therapeutic Level Labs: No results found for: LITHIUM No results found for: VALPROATE No components found for:  CBMZ  Current Medications: Current Outpatient Medications  Medication Sig Dispense Refill   clonazePAM (KLONOPIN) 0.25 MG disintegrating tablet Take 1 tablet (0.25 mg total) by mouth 2 (two) times daily as needed. For anxiety attacks 60 tablet 1   cyclobenzaprine (FLEXERIL) 10 MG tablet Take 10 mg by mouth 3 (three) times daily as needed for muscle spasms.      Hyoscyamine Sulfate SL 0.125 MG SUBL Place under the tongue.     rosuvastatin (CRESTOR) 10 MG tablet Take 10 mg by mouth at bedtime.     valACYclovir (VALTREX) 500 MG tablet TAKE 1 TABLET BY MOUTH ONCE DAILY     vortioxetine HBr (TRINTELLIX) 20 MG TABS tablet Take 1 tablet (20 mg total) by mouth daily. 90 tablet 1   zolpidem (AMBIEN CR) 12.5 MG CR tablet Take 1 tablet (12.5 mg total) by mouth at bedtime as needed for sleep. Stop Zaleplon 90 tablet 0   bisoprolol-hydrochlorothiazide (ZIAC) 5-6.25 MG tablet Take 2 tablets by mouth daily.     busPIRone (BUSPAR) 15 MG tablet Take 1 tablet (15 mg total) by mouth 2 (two) times daily. 180 tablet 0   omeprazole (PRILOSEC) 20 MG capsule Take by mouth.     No current facility-administered medications for this visit.     Musculoskeletal: Strength & Muscle Tone: within normal limits Gait & Station: normal Patient leans: N/A  Psychiatric Specialty Exam: Review of Systems  Psychiatric/Behavioral:  The patient is nervous/anxious.   All other systems reviewed and are negative.  Blood pressure (!) 161/94, pulse 72, temperature 98.3 F (36.8 C), temperature source Temporal, weight 200 lb 9.6 oz (91 kg).Body mass index is 28.78 kg/m.  General Appearance: Casual  Eye Contact:  Fair  Speech:  Clear and Coherent  Volume:  Normal  Mood:  Anxious  improving  Affect:  Congruent  Thought Process:  Goal Directed and Descriptions of Associations: Intact  Orientation:  Full (Time, Place, and Person)  Thought Content: Logical   Suicidal Thoughts:  No  Homicidal Thoughts:  No  Memory:  Immediate;   Fair Recent;   Fair Remote;   Fair  Judgement:  Fair  Insight:  Fair  Psychomotor Activity:  Normal  Concentration:  Concentration: Fair and Attention Span: Fair  Recall:  [employer] of Knowledge: Fair  Language: Fair  Akathisia:  No  Handed:  Right  AIMS (if indicated): done, 0  Assets:  Communication Skills Desire for Improvement Housing Social Support  ADL's:  Intact  Cognition: WNL  Sleep:  Fair   Screenings: GAD-7    Flowsheet Row Video Visit from 04/21/2021 in [facility] Office Visit from  03/12/2021 in [facility] Video Visit from 01/22/2021 in [facility] Video Visit from 09/21/2020 in [facility]  Total GAD-7 Score 9 13 12 5       PHQ2-9    Flowsheet Row Office Visit from 09/14/2021 in [facility] Office Visit from 08/12/2021 in [facility] Office Visit from 07/20/2021 in [facility] Video Visit from 04/21/2021 in [facility] Office Visit from 03/12/2021 in [facility]  PHQ-2 Total Score 2 2 4 4 3   PHQ-9 Total Score 7 7 8 8 10       Flowsheet Row Office Visit from 08/12/2021 in [facility] Office Visit from 07/20/2021 in [facility] Video Visit from 04/21/2021 in [facility]  C-SSRS RISK CATEGORY Error: Q3, 4, or 5 should not be populated when Q2 is No Error: Q3, 4, or 5 should not be populated when Q2 is No No Risk        Assessment and Plan: Shuayb Schepers is a  50 year old Caucasian male, employed, lives in [place], has a history of MDD, OCD, ADHD, insomnia was evaluated in office today.  Patient is currently improving with regards to his mood, sleep, will benefit from the following plan.  Plan MDD in partial remission Trintellix 20 mg p.o. daily Klonopin 0.25 mg p.o. twice daily as needed for severe anxiety attacks BuSpar 15 mg p.o. twice daily Patient was referred for TMS-pending  OCD-stable Continue CBT  ADHD-stable Will not make any medication changes. Currently not on Concerta  Primary insomnia-improving Ambien CR 12.5 mg p.o. nightly  GAD-stable BuSpar Trintellix as prescribed  Elevated blood pressure reading-unstable Reviewed notes by Dr. [name] 08/25/2021 as noted above Patient is currently on antihypertensive medication. Patient advised to keep a log of his blood pressure.  Follow-up with primary care provider.  We will coordinate care.  Follow-up in clinic in 6 to 8 weeks or sooner.  This note was generated in part or whole with voice recognition software. Voice recognition is usually quite accurate but there are transcription errors that can and very often do occur. I apologize for any typographical errors that were not detected and corrected.       [place], MD 09/14/2021, 4:50 PM

## 2021-11-16 ENCOUNTER — Ambulatory Visit: Payer: BC Managed Care – PPO | Admitting: Psychiatry

## 2021-11-16 ENCOUNTER — Encounter: Payer: Self-pay | Admitting: Psychiatry

## 2021-11-16 ENCOUNTER — Other Ambulatory Visit: Payer: Self-pay

## 2021-11-16 VITALS — BP 134/88 | HR 78 | Temp 98.5°F | Wt 197.4 lb

## 2021-11-16 DIAGNOSIS — F900 Attention-deficit hyperactivity disorder, predominantly inattentive type: Secondary | ICD-10-CM

## 2021-11-16 DIAGNOSIS — F429 Obsessive-compulsive disorder, unspecified: Secondary | ICD-10-CM | POA: Diagnosis not present

## 2021-11-16 DIAGNOSIS — F5101 Primary insomnia: Secondary | ICD-10-CM

## 2021-11-16 DIAGNOSIS — F3342 Major depressive disorder, recurrent, in full remission: Secondary | ICD-10-CM

## 2021-11-16 DIAGNOSIS — F411 Generalized anxiety disorder: Secondary | ICD-10-CM

## 2021-11-16 MED ORDER — CLONAZEPAM 0.25 MG PO TBDP: 0.2500 mg | ORAL_TABLET | ORAL | 1 refills | Status: AC

## 2021-11-16 MED ORDER — ZOLPIDEM TARTRATE ER 12.5 MG PO TBCR: 12.5000 mg | EXTENDED_RELEASE_TABLET | Freq: Every evening | ORAL | 0 refills | Status: DC | PRN

## 2021-11-16 MED ORDER — VORTIOXETINE HBR 20 MG PO TABS: 20.0000 mg | ORAL_TABLET | Freq: Every day | ORAL | 1 refills | Status: DC

## 2021-11-16 MED ORDER — BUSPIRONE HCL 15 MG PO TABS: 15.0000 mg | ORAL_TABLET | Freq: Two times a day (BID) | ORAL | 0 refills | Status: DC

## 2021-11-16 NOTE — Progress Notes (Signed)
BH MD OP Progress Note  11/16/2021 12:31 PM Jeremy Boyer  MRN:  [id number]  Chief Complaint:  Chief Complaint  Patient presents with   Follow-up 51 year old Caucasian male with history of MDD, OCD, ADHD, GAD, primary insomnia, presented for medication management.   HPI: Jeremy Boyer is a 51 year old Caucasian male, married, employed, lives in [place], has a history of MDD, OCD, ADHD, GAD, primary insomnia was evaluated in office today.  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  Hence this has been anxiety provoking although handling it well so far.  Reports 23 year old son is planning to go to the [employer].  Patient already has a 49 year old in the [employer].  Patient became tearful when he discussed it however reports he is happy for his son.  Patient reports sleep is good on the Ambien.  Reports ADHD symptoms are manageable, currently not on Concerta.  Denies any OCD symptoms.  Reports compliance on Trintellix and BuSpar.  Does use Klonopin as needed although limiting use.  Does have mild GI symptoms on and off however it is currently stable.  Patient is not interested in starting psychotherapy sessions again.  Denies any suicidality, homicidality or perceptual disturbances.  Denies any other concerns today.  Visit Diagnosis:    ICD-10-CM   1. MDD (major depressive disorder), recurrent, in full remission (HCC)  F33.42 vortioxetine HBr (TRINTELLIX) 20 MG TABS tablet    clonazePAM (KLONOPIN) 0.25 MG disintegrating tablet    2. 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 Attention deficit hyperactivity disorder (ADHD), predominantly inattentive type  F90.0     4. GAD (generalized anxiety disorder)  F41.1     5. Primary insomnia  F51.01 zolpidem (AMBIEN CR) 12.5 MG CR tablet      Past Psychiatric History:  Reviewed past psychiatric history from progress note on 10/23/2018.  Past trials of Paxil, Abilify, Trintellix, Depakote, mirtazapine, risperidone, duloxetine, Seroquel, Ambien, Sonata.  Past Medical History:  Past Medical History:  Diagnosis Date   Anxiety and depression 12/01/2015   Bipolar disorder (HCC)    Cholecystitis    Hypertension 07/05/2017   Insomnia 07/05/2017   MDD (major depressive disorder), recurrent episode, moderate (HCC) 01/13/2016   OCD (obsessive compulsive disorder) 01/13/2016    Past Surgical History:  Procedure Laterality Date   CHOLECYSTECTOMY N/A 07/10/2017   Procedure: LAPAROSCOPIC CHOLECYSTECTOMY;  Surgeon: Ancil Linsey, MD;  Location: [facility] ORS;  Service: General;  Laterality: N/A;   VASECTOMY     VASECTOMY REVERSAL      Family Psychiatric History: Reviewed family psychiatric history from progress note on 10/23/2018.  Family History:  Family History  Problem Relation Age of Onset   Anxiety disorder Mother    Depression Mother    Alcohol abuse Father    Anxiety disorder Sister    Depression Sister     Social History: Reviewed social history from progress note on 10/23/2018. Social History   Socioeconomic History   Marital status: Married    Spouse name: nancy   Number of children: 3   Years of education: Not on file   Highest education level: Bachelor's degree (e.g., BA, AB, BS)  Occupational History   Not on file  Tobacco Use   Smoking status: Never   Smokeless tobacco: Never  Vaping Use   Vaping Use: Never used  Substance and Sexual Activity  Alcohol use: Yes    Alcohol/week: 23.0 standard drinks    Types: 2 Glasses of wine, 14 Cans of beer, 7 Shots of liquor per week   Drug use: No   Sexual activity: Yes    Partners: Female    Birth control/protection: None  Other Topics Concern   Not on file  Social History Narrative   Not on file   Social Determinants of Health   Financial Resource Strain: Not on file  Food Insecurity: Not  on file  Transportation Needs: Not on file  Physical Activity: Not on file  Stress: Not on file  Social Connections: Not on file    Allergies: No Known Allergies  Metabolic Disorder Labs: No results found for: HGBA1C, MPG No results found for: PROLACTIN No results found for: CHOL, TRIG, HDL, CHOLHDL, VLDL, LDLCALC Lab Results  Component Value Date   TSH 3.980 10/23/2018    Therapeutic Level Labs: No results found for: LITHIUM No results found for: VALPROATE No components found for:  CBMZ  Current Medications: Current Outpatient Medications  Medication Sig Dispense Refill   bisoprolol-hydrochlorothiazide (ZIAC) 5-6.25 MG tablet Take 2 tablets by mouth daily.     cyclobenzaprine (FLEXERIL) 10 MG tablet Take 10 mg by mouth 3 (three) times daily as needed for muscle spasms.      Hyoscyamine Sulfate SL 0.125 MG SUBL Place under the tongue.     rosuvastatin (CRESTOR) 10 MG tablet Take 10 mg by mouth at bedtime.     valACYclovir (VALTREX) 500 MG tablet TAKE 1 TABLET BY MOUTH ONCE DAILY     busPIRone (BUSPAR) 15 MG tablet Take 1 tablet (15 mg total) by mouth 2 (two) times daily. 180 tablet 0   clonazePAM (KLONOPIN) 0.25 MG disintegrating tablet Take 1 tablet (0.25 mg total) by mouth as directed. Take once daily as needed for anxiety attacks, limit use. 30 tablet 1   omeprazole (PRILOSEC) 20 MG capsule Take by mouth.     vortioxetine HBr (TRINTELLIX) 20 MG TABS tablet Take 1 tablet (20 mg total) by mouth daily. 90 tablet 1   zolpidem (AMBIEN CR) 12.5 MG CR tablet Take 1 tablet (12.5 mg total) by mouth at bedtime as needed for sleep. Stop Zaleplon 90 tablet 0   No current facility-administered medications for this visit.     Musculoskeletal: Strength & Muscle Tone: within normal limits Gait & Station: normal Patient leans: N/A  Psychiatric Specialty Exam: Review of Systems  Psychiatric/Behavioral:  The patient is nervous/anxious.   All other systems reviewed and are negative.   Blood pressure 134/88, pulse 78, temperature 98.5 F (36.9 C), temperature source Temporal, weight 197 lb 6.4 oz (89.5 kg).Body mass index is 28.32 kg/m.  General Appearance: Casual  Eye Contact:  Fair  Speech:  Clear and Coherent  Volume:  Normal  Mood:  Anxious stable  Affect:  Tearful  Thought Process:  Goal Directed and Descriptions of Associations: Intact  Orientation:  Full (Time, Place, and Person)  Thought Content: Logical   Suicidal Thoughts:  No  Homicidal Thoughts:  No  Memory:  Immediate;   Fair Recent;   Fair Remote;   Fair  Judgement:  Fair  Insight:  Fair  Psychomotor Activity:  Normal  Concentration:  Concentration: Fair and Attention Span: Fair  Recall:  [employer] of Knowledge: Fair  Language: Fair  Akathisia:  No  Handed:  Right  AIMS (if indicated): done,0  Assets:  Communication Skills Desire for Improvement Housing Intimacy Social Support  Talents/Skills  ADL's:  Intact  Cognition: WNL  Sleep:  Fair   Screenings: GAD-7    Flowsheet Row Office Visit from 11/16/2021 in [facility] Video Visit from 04/21/2021 in [facility] Office Visit from 03/12/2021 in [facility] Video Visit from 01/22/2021 in [facility] Video Visit from 09/21/2020 in [facility]  Total GAD-7 Score 5 9 13 12 5       PHQ2-9    Flowsheet Row Office Visit from 11/16/2021 in [facility] Office Visit from 09/14/2021 in [facility] Office Visit from 08/12/2021 in [facility] Office Visit from 07/20/2021 in [facility] Video Visit from 04/21/2021 in [facility]  PHQ-2 Total Score 2 2 2 4 4   PHQ-9 Total Score 4 7 7 8 8       Flowsheet Row Office Visit from 11/16/2021 in [facility] Office Visit from 08/12/2021 in [facility] Office Visit from 07/20/2021 in [facility]  C-SSRS RISK CATEGORY No Risk Error: Q3, 4, or 5 should not be populated when Q2 is No Error: Q3, 4, or 5 should not be populated when Q2 is No        Assessment and Plan: Jermell Holeman is a 51 year old Caucasian male, employed, lives in [place], has a history of MDD, OCD, ADHD, insomnia was evaluated in office today.  Patient is currently struggling with multiple psychosocial stressors although compliant on medications, currently stable.  Discussed plan as noted below.   Plan MDD in remission Trintellix 20 mg p.o. daily Klonopin 0.25 mg p.o. daily as needed for anxiety attacks BuSpar 15 mg p.o. twice daily Patient was referred for TMS-pending  OCD-stable Continue CBT  ADHD-stable Currently not on Concerta.  Primary insomnia-stable Ambien CR 12.5 mg p.o. nightly  GAD-stable BuSpar and Trintellix as prescribed Discussed referral for CBT, patient is not interested.   Follow-up in clinic in 3 months or sooner if needed.  This note was generated in part or whole with voice recognition software. Voice recognition is usually quite accurate but there are transcription errors that can and very often do occur. I apologize for any typographical errors that were not detected and corrected.     Jeremy Overman, MD 11/16/2021, 12:31 PM

## 2022-02-05 ENCOUNTER — Other Ambulatory Visit: Payer: Self-pay | Admitting: Psychiatry

## 2022-02-05 DIAGNOSIS — F429 Obsessive-compulsive disorder, unspecified: Secondary | ICD-10-CM

## 2022-02-15 ENCOUNTER — Ambulatory Visit: Payer: BC Managed Care – PPO | Admitting: Psychiatry

## 2022-02-23 ENCOUNTER — Other Ambulatory Visit: Payer: Self-pay | Admitting: Psychiatry

## 2022-02-23 DIAGNOSIS — F5101 Primary insomnia: Secondary | ICD-10-CM

## 2022-05-30 ENCOUNTER — Other Ambulatory Visit: Payer: Self-pay | Admitting: Psychiatry

## 2022-05-30 DIAGNOSIS — F5101 Primary insomnia: Secondary | ICD-10-CM

## 2022-06-09 ENCOUNTER — Other Ambulatory Visit: Payer: Self-pay | Admitting: Psychiatry

## 2022-06-09 DIAGNOSIS — F5101 Primary insomnia: Secondary | ICD-10-CM

## 2022-07-28 ENCOUNTER — Other Ambulatory Visit: Payer: Self-pay | Admitting: Psychiatry

## 2022-07-28 DIAGNOSIS — F3342 Major depressive disorder, recurrent, in full remission: Secondary | ICD-10-CM

## 2022-07-29 NOTE — Telephone Encounter (Signed)
Message left to call and schedule.

## 2023-01-27 ENCOUNTER — Ambulatory Visit
Admission: EM | Admit: 2023-01-27 | Discharge: 2023-01-27 | Disposition: A | Discharge status: Home / Self Care | Payer: BC Managed Care – PPO | Attending: Urgent Care | Admitting: Urgent Care

## 2023-01-27 ENCOUNTER — Ambulatory Visit: Admit: 2023-01-27 | Disposition: A | Discharge status: Home / Self Care | Payer: BC Managed Care – PPO

## 2023-01-27 DIAGNOSIS — J029 Acute pharyngitis, unspecified: Secondary | ICD-10-CM | POA: Diagnosis not present

## 2023-01-27 LAB — POCT RAPID STREP A (OFFICE): Rapid Strep A Screen: NEGATIVE

## 2023-01-27 NOTE — ED Provider Notes (Addendum)
Jeremy Boyer    CSN: [id number] Arrival date & time: 01/27/23  4098      History   Chief Complaint Chief Complaint  Patient presents with   Sore Throat   Fever    HPI Jeremy Boyer is a 52 y.o. male.    Sore Throat  Fever   Patient presents to urgent care with complaint of sore throat x 3 days.  Endorses fever with last known 4 days ago. Reports " a little" cough. Negative nasal congestion.  Past Medical History:  Diagnosis Date   Anxiety and depression 12/01/2015   Bipolar disorder (HCC)    Cholecystitis    Hypertension 07/05/2017   Insomnia 07/05/2017   MDD (major depressive disorder), recurrent episode, moderate (HCC) 01/13/2016   OCD (obsessive compulsive disorder) 01/13/2016    Patient Active Problem List   Diagnosis Date Noted   Elevated blood pressure reading 07/20/2021   GAD (generalized anxiety disorder) 01/22/2021   High risk medication use 03/09/2020   ADHD (attention deficit hyperactivity disorder), combined type 01/06/2020   MDD (major depressive disorder), recurrent, in full remission (HCC) 10/08/2019   Drug reaction 10/08/2019   Attention deficit hyperactivity disorder (ADHD), predominantly inattentive type 04/30/2019   Cholecystitis    Hypertension 07/05/2017   Insomnia 07/05/2017   MDD (major depressive disorder), recurrent episode, moderate (HCC) 01/13/2016   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 Authorizing Provider  bisoprolol-hydrochlorothiazide (ZIAC) 5-6.25 MG tablet Take 2 tablets by mouth daily.  08/25/21   [provider]  busPIRone (BUSPAR) 15 MG tablet TAKE 1 TABLET(15 MG) BY MOUTH TWICE DAILY 02/07/22   Jomarie Longs, MD  clonazePAM (KLONOPIN) 0.25 MG disintegrating tablet Take 1 tablet (0.25 mg total) by mouth as directed. Take once daily as needed for anxiety attacks, limit use. 11/16/21   Jomarie Longs, MD  cyclobenzaprine (FLEXERIL) 10 MG tablet Take 10 mg by mouth 3 (three) times daily as needed for muscle spasms.  06/13/17   [provider]  Hyoscyamine Sulfate SL 0.125 MG SUBL Place under the tongue. 07/14/21   [provider]  omeprazole (PRILOSEC) 20 MG capsule Take by mouth. 06/30/20 06/30/21  [provider]  rosuvastatin (CRESTOR) 10 MG tablet Take 10 mg by mouth at bedtime. 11/12/19   [provider]  TRINTELLIX 20 MG TABS tablet TAKE 1 TABLET(20 MG) BY MOUTH DAILY 07/28/22   Jomarie Longs, MD  valACYclovir (VALTREX) 500 MG tablet TAKE 1 TABLET BY MOUTH ONCE DAILY 11/09/18   [provider]  zolpidem (AMBIEN CR) 12.5 MG CR tablet TAKE ONE TABLET BY MOUTH AT BEDTIME AS NEEDED FOR SLEEP, STOP TAKING ZALEPLON 02/24/22   Jomarie Longs, MD    Family History Family History  Problem Relation Age of Onset   Anxiety disorder Mother    Depression Mother    Alcohol abuse Father    Anxiety disorder Sister    Depression Sister     Social History Social History   Tobacco Use   Smoking status: Never   Smokeless tobacco: Never  Vaping Use   Vaping Use: Never used  Substance Use Topics   Alcohol use: Yes    Alcohol/week: 23.0 standard drinks of alcohol    Types: 2 Glasses of wine, 14 Cans of beer, 7 Shots of liquor per week   Drug use: No     Allergies   Amoxicillin   Review of Systems Review of Systems  Constitutional:  Positive for fever.     Physical Exam Triage Vital Signs ED Triage Vitals  Enc Vitals Group     BP      Pulse      Resp      Temp      Temp src      SpO2      Weight      Height       Head Circumference      Peak Flow      Pain Score      Pain Loc      Pain Edu?      Excl. in GC?    No data found.  Updated Vital Signs There were no vitals taken for this visit.  Visual Acuity Right Eye Distance:   Left Eye Distance:   Bilateral Distance:    Right Eye Near:   Left Eye Near:    Bilateral Near:     Physical Exam Vitals reviewed.  Constitutional:      Appearance: He is well-developed.  HENT:     Mouth/Throat:     Mouth: Mucous membranes are moist.     Pharynx: Posterior oropharyngeal erythema present. No oropharyngeal exudate.  Cardiovascular:     Rate and Rhythm: Normal rate and regular rhythm.     Heart sounds: Normal heart sounds.  Pulmonary:     Effort: Pulmonary effort is normal.     Breath sounds: Normal breath sounds.  Skin:    General: Skin is warm and dry.  Neurological:     General: No focal deficit present.     Mental Status: He is alert and oriented to person, place, and time.  Psychiatric:        Mood and Affect: Mood normal.        Behavior: Behavior normal.      UC Treatments / Results  Labs (all labs ordered are listed, but only abnormal results are displayed) Labs Reviewed  POCT RAPID STREP A (OFFICE)    EKG   Radiology No results found.  Procedures Procedures (including critical care time)  Medications Ordered in UC Medications - No data to display  Initial Impression / Assessment and Plan / UC Course  I have reviewed the triage vital signs and the nursing notes.  Pertinent labs & imaging results that were available during my care of the patient were reviewed by me and considered in my medical decision making (see chart for details).   Jeremy Boyer is a 52 y.o. male presenting with sore throat. Patient is afebrile without recent antipyretics, satting well on room air. Overall is well appearing and non-toxic, well hydrated, without respiratory distress. Pulmonary exam is unremarkable.  Lungs CTAB without  wheezing, rhonchi, rales.  Mild post-pharyngeal erythema is present.  No peritonsillar exudate.  Rapid strep result is negative.  Supportive care with use of OTC medication for symptom control is recommended.  Counseled patient on potential for adverse effects with medications prescribed/recommended today, ER and return-to-clinic precautions discussed, patient verbalized understanding and agreement with care plan.   Final Clinical Impressions(s) / UC Diagnoses  Final diagnoses:  None   Discharge Instructions   None    ED Prescriptions   None    PDMP not reviewed this encounter.   Charma Igo, FNP 01/27/23 0901    Charma Igo, FNP 01/27/23 [phone]

## 2023-01-27 NOTE — Discharge Instructions (Signed)
Follow up here or with your primary care provider if your symptoms are worsening or not improving.     

## 2023-01-27 NOTE — ED Triage Notes (Signed)
Patient presents to UC for sore throat x 3 days. Last known fever on Monday. Treating symptoms with cough drops and tylenol.

## 2023-07-25 ENCOUNTER — Ambulatory Visit: Payer: BC Managed Care – PPO | Admitting: Psychiatry

## 2024-10-22 ENCOUNTER — Ambulatory Visit: Admitting: Anesthesiology

## 2024-10-22 ENCOUNTER — Ambulatory Visit
Admission: RE | Admit: 2024-10-22 | Discharge: 2024-10-22 | Disposition: A | Discharge status: Home / Self Care | Payer: Self-pay | Attending: Gastroenterology | Admitting: Gastroenterology

## 2024-10-22 ENCOUNTER — Encounter: Payer: Self-pay | Admitting: Gastroenterology

## 2024-10-22 ENCOUNTER — Encounter
Admission: RE | Disposition: A | Discharge status: Home / Self Care | Payer: Self-pay | Source: Home / Self Care | Attending: Gastroenterology

## 2024-10-22 ENCOUNTER — Other Ambulatory Visit: Payer: Self-pay | Admitting: Medical Genetics

## 2024-10-22 ENCOUNTER — Other Ambulatory Visit: Payer: Self-pay

## 2024-10-22 DIAGNOSIS — F319 Bipolar disorder, unspecified: Secondary | ICD-10-CM | POA: Insufficient documentation

## 2024-10-22 DIAGNOSIS — I10 Essential (primary) hypertension: Secondary | ICD-10-CM | POA: Insufficient documentation

## 2024-10-22 DIAGNOSIS — D125 Benign neoplasm of sigmoid colon: Secondary | ICD-10-CM | POA: Diagnosis not present

## 2024-10-22 DIAGNOSIS — D124 Benign neoplasm of descending colon: Secondary | ICD-10-CM | POA: Diagnosis not present

## 2024-10-22 DIAGNOSIS — Z1211 Encounter for screening for malignant neoplasm of colon: Secondary | ICD-10-CM | POA: Diagnosis present

## 2024-10-22 DIAGNOSIS — Z79899 Other long term (current) drug therapy: Secondary | ICD-10-CM | POA: Diagnosis not present

## 2024-10-22 DIAGNOSIS — Z818 Family history of other mental and behavioral disorders: Secondary | ICD-10-CM | POA: Diagnosis not present

## 2024-10-22 DIAGNOSIS — F419 Anxiety disorder, unspecified: Secondary | ICD-10-CM | POA: Diagnosis not present

## 2024-10-22 HISTORY — PX: POLYPECTOMY: SHX149

## 2024-10-22 HISTORY — PX: COLONOSCOPY: SHX5424

## 2024-10-22 MED ORDER — PROPOFOL 500 MG/50ML IV EMUL
INTRAVENOUS | Status: DC | PRN
  Administered 2024-10-22: 150 ug/kg/min via INTRAVENOUS

## 2024-10-22 MED ORDER — PHENYLEPHRINE 80 MCG/ML (10ML) SYRINGE FOR IV PUSH (FOR BLOOD PRESSURE SUPPORT)
PREFILLED_SYRINGE | INTRAVENOUS | Status: DC | PRN
  Administered 2024-10-22 (×2): 80 ug via INTRAVENOUS

## 2024-10-22 MED ORDER — SODIUM CHLORIDE 0.9 % IV SOLN: INTRAVENOUS | Status: DC

## 2024-10-22 MED ORDER — DEXMEDETOMIDINE HCL IN NACL 80 MCG/20ML IV SOLN
INTRAVENOUS | Status: DC | PRN
  Administered 2024-10-22: 12 ug via INTRAVENOUS
  Administered 2024-10-22 (×2): 8 ug via INTRAVENOUS

## 2024-10-22 MED ORDER — MIDAZOLAM HCL (PF) 2 MG/2ML IJ SOLN
INTRAMUSCULAR | Status: DC | PRN
  Administered 2024-10-22: 2 mg via INTRAVENOUS

## 2024-10-22 MED ORDER — MIDAZOLAM HCL 2 MG/2ML IJ SOLN
INTRAMUSCULAR | Status: AC
  Filled 2024-10-22: qty 2

## 2024-10-22 MED ORDER — PROPOFOL 10 MG/ML IV BOLUS
INTRAVENOUS | Status: DC | PRN
  Administered 2024-10-22: 100 mg via INTRAVENOUS

## 2024-10-22 NOTE — Transfer of Care (Signed)
 Immediate Anesthesia Transfer of Care Note  Patient: Jeremy Boyer  Procedure(s) Performed: COLONOSCOPY POLYPECTOMY, INTESTINE  Patient Location: Endoscopy Unit  Anesthesia Type:General  Level of Consciousness: drowsy  Airway & Oxygen Therapy: Patient Spontanous Breathing  Post-op Assessment: Report given to RN  Post vital signs: stable  Last Vitals:  Vitals Value Taken Time  BP 92/58 10/22/24 09:22  Temp    Pulse 85 10/22/24 09:22  Resp 16 10/22/24 09:22  SpO2 95 % 10/22/24 09:22  Vitals shown include unfiled device data.  Last Pain: There were no vitals filed for this visit.       Complications: No notable events documented.

## 2024-10-22 NOTE — Anesthesia Preprocedure Evaluation (Signed)
"                                    Anesthesia Evaluation  Patient identified by MRN, date of birth, ID band Patient awake    Reviewed: Allergy & Precautions, H&P , NPO status , Patient's Chart, lab work & pertinent test results, reviewed documented beta blocker date and time   Airway Mallampati: II   Neck ROM: full    Dental  (+) Poor Dentition   Pulmonary neg pulmonary ROS   Pulmonary exam normal        Cardiovascular Exercise Tolerance: Good hypertension, On Medications negative cardio ROS Normal cardiovascular exam Rhythm:regular Rate:Normal     Neuro/Psych  PSYCHIATRIC DISORDERS Anxiety Depression Bipolar Disorder   negative neurological ROS     GI/Hepatic negative GI ROS, Neg liver ROS,,,  Endo/Other  negative endocrine ROS    Renal/GU negative Renal ROS  negative genitourinary   Musculoskeletal   Abdominal   Peds  Hematology negative hematology ROS (+)   Anesthesia Other Findings Past Medical History: 12/01/2015: Anxiety and depression No date: Bipolar disorder (HCC) No date: Cholecystitis 07/05/2017: Hypertension 07/05/2017: Insomnia 01/13/2016: MDD (major depressive disorder), recurrent episode,  moderate (HCC) 01/13/2016: OCD (obsessive compulsive disorder) Past Surgical History: 07/10/2017: CHOLECYSTECTOMY; N/A     Comment:  Procedure: LAPAROSCOPIC CHOLECYSTECTOMY;  Surgeon:               Nicholaus Selinda Birmingham, MD;  Location: [facility] ORS;  Service:               General;  Laterality: N/A; No date: VASECTOMY No date: VASECTOMY REVERSAL   Reproductive/Obstetrics negative OB ROS                              Anesthesia Physical Anesthesia Plan  ASA: 2  Anesthesia Plan: General   Post-op Pain Management:    Induction:   PONV Risk Score and Plan:   Airway Management Planned:   Additional Equipment:   Intra-op Plan:   Post-operative Plan:   Informed Consent: I have reviewed the patients History and  Physical, chart, labs and discussed the procedure including the risks, benefits and alternatives for the proposed anesthesia with the patient or authorized representative who has indicated his/her understanding and acceptance.     Dental Advisory Given  Plan Discussed with: CRNA  Anesthesia Plan Comments:         Anesthesia Quick Evaluation  "

## 2024-10-22 NOTE — H&P (Signed)
 "   Corinn JONELLE Brooklyn, MD [facility], [address]  [place], [place] [address]  Main: [phone] Fax:  [phone] Pager: [phone]   Primary Care Physician:  Valora Lynwood FALCON, MD Primary Gastroenterologist:  Dr. Corinn JONELLE Brooklyn  Pre-Procedure History & Physical: HPI:  Jeremy Boyer is a 54 y.o. male is here for an colonoscopy.   Past Medical History:  Diagnosis Date   Anxiety and depression 12/01/2015   Bipolar disorder (HCC)    Cholecystitis    Hypertension 07/05/2017   Insomnia 07/05/2017   MDD (major depressive disorder), recurrent episode, moderate (HCC) 01/13/2016   OCD (obsessive compulsive disorder) 01/13/2016    Past Surgical History:  Procedure Laterality Date   CHOLECYSTECTOMY N/A 07/10/2017   Procedure: LAPAROSCOPIC CHOLECYSTECTOMY;  Surgeon: Nicholaus Selinda Birmingham, MD;  Location: [facility] ORS;  Service: General;  Laterality: N/A;   VASECTOMY     VASECTOMY REVERSAL      Prior to Admission medications  Medication Sig Start Date End Date Taking? Authorizing Provider  ALPRAZolam (XANAX XR) 1 MG 24 hr tablet Take 1 mg by mouth daily.   Yes [provider]  bisoprolol-hydrochlorothiazide (ZIAC) 5-6.25 MG tablet Take 2 tablets by mouth daily. 08/25/21  Yes [provider]  clonazePAM  (KLONOPIN ) 0.25 MG disintegrating tablet Take 1 tablet (0.25 mg total) by mouth as directed. Take once daily as needed for anxiety attacks, limit use. 11/16/21  Yes Eappen, Saramma, MD  fenofibrate (TRICOR) 48 MG tablet Take 160 mg by mouth daily.   Yes [provider]  rosuvastatin (CRESTOR) 10 MG tablet Take 10 mg by mouth at bedtime. 11/12/19  Yes [provider]  traZODone (DESYREL) 50 MG tablet Take 50 mg by mouth at bedtime.   Yes [provider]  busPIRone  (BUSPAR ) 15 MG tablet TAKE 1 TABLET(15 MG) BY MOUTH TWICE DAILY 02/07/22   Eappen, Saramma, MD  cyclobenzaprine (FLEXERIL) 10 MG tablet Take 10 mg by mouth 3  (three) times daily as needed for muscle spasms.  06/13/17   [provider]  Hyoscyamine Sulfate SL 0.125 MG SUBL Place under the tongue. 07/14/21   [provider]  omeprazole (PRILOSEC) 20 MG capsule Take by mouth. 06/30/20 06/30/21  [provider]  TRINTELLIX  20 MG TABS tablet TAKE 1 TABLET(20 MG) BY MOUTH DAILY 07/28/22   Eappen, Saramma, MD  valACYclovir (VALTREX) 500 MG tablet TAKE 1 TABLET BY MOUTH ONCE DAILY 11/09/18   [provider]  zolpidem  (AMBIEN  CR) 12.5 MG CR tablet TAKE ONE TABLET BY MOUTH AT BEDTIME AS NEEDED FOR SLEEP, STOP TAKING ZALEPLON  02/24/22   Eappen, Saramma, MD    Allergies as of 10/07/2024 - Review Complete 01/27/2023  Allergen Reaction Noted   Amoxicillin Rash 01/27/2023    Family History  Problem Relation Age of Onset   Anxiety disorder Mother    Depression Mother    Alcohol abuse Father    Anxiety disorder Sister    Depression Sister     Social History   Socioeconomic History   Marital status: Married    Spouse name: nancy   Number of children: 3   Years of education: Not on file   Highest education level: Bachelor's degree (e.g., BA, AB, BS)  Occupational History   Not on file  Tobacco Use   Smoking status: Never   Smokeless tobacco: Never  Vaping Use   Vaping status: Never Used  Substance and Sexual Activity   Alcohol use: Yes    Alcohol/week:  23.0 standard drinks of alcohol    Types: 2 Glasses of wine, 14 Cans of beer, 7 Shots of liquor per week   Drug use: No   Sexual activity: Yes    Partners: Female    Birth control/protection: None  Other Topics Concern   Not on file  Social History Narrative   Not on file   Social Drivers of Health   Tobacco Use: Low Risk (10/22/2024)   Patient History    Smoking Tobacco Use: Never    Smokeless Tobacco Use: Never    Passive Exposure: Not on file  Recent Concern: Tobacco Use - Medium Risk (09/23/2024)   Received from [facility]   Patient  History    Smoking Tobacco Use: Never    Smokeless Tobacco Use: Former    Passive Exposure: Not on Actuary Strain: Low Risk  (09/20/2024)   Received from [facility]   Overall Financial Resource Strain (CARDIA)    Difficulty of Paying Living Expenses: Not hard at all  Food Insecurity: No Food Insecurity (09/20/2024)   Received from [facility]   Epic    Within the past 12 months, you worried that your food would run out before you got the money to buy more.: Never true    Within the past 12 months, the food you bought just didn't last and you didn't have money to get more.: Never true  Transportation Needs: No Transportation Needs (09/20/2024)   Received from [facility] - Transportation    In the past 12 months, has lack of transportation kept you from medical appointments or from getting medications?: No    Lack of Transportation (Non-Medical): No  Physical Activity: Not on file  Stress: Not on file  Social Connections: Not on file  Intimate Partner Violence: Not on file  Depression (PHQ2-9): Low Risk (11/16/2021)   Depression (PHQ2-9)    PHQ-2 Score: 4  Alcohol Screen: Not on file  Housing: Low Risk  (09/20/2024)   Received from [facility]   Epic    In the last 12 months, was there a time when you were not able to pay the mortgage or rent on time?: No    In the past 12 months, how many times have you moved where you were living?: 0    At any time in the past 12 months, were you homeless or living in a shelter (including now)?: No  Utilities: Not At Risk (09/20/2024)   Received from [facility]   Epic    In the past 12 months has the electric, gas, oil, or water company threatened to shut off services in your home?: No  Health Literacy: Not on file    Review of Systems: See HPI, otherwise negative ROS  Physical Exam: BP 119/89   Pulse 79   Temp (!) 96.8  F (36 C) (Temporal)   Resp 15   SpO2 96%  General:   Alert,  pleasant and cooperative in NAD Head:  Normocephalic and atraumatic. Neck:  Supple; no masses or thyromegaly. Lungs:  Clear throughout to auscultation.    Heart:  Regular rate and rhythm. Abdomen:  Soft, nontender and nondistended. Normal bowel sounds, without guarding, and without rebound.   Neurologic:  Alert and  oriented x4;  grossly normal neurologically.  Impression/Plan: Jeremy Boyer is here for an colonoscopy to be performed for colon cancer screening  Risks, benefits,  limitations, and alternatives regarding  colonoscopy have been reviewed with the patient.  Questions have been answered.  All parties agreeable.   Corinn Brooklyn, MD  10/22/2024, 9:48 AM "

## 2024-10-22 NOTE — Op Note (Signed)
 [facility] Patient Name: Jeremy Boyer Procedure Date: 10/22/2024 8:41 AM MRN: [id number] Account #: [id number] Date of Birth: [birth year] Admit Type: Outpatient Age: 54 Room: [facility] ENDO ROOM 3 Gender: Male Note Status: Finalized Instrument Name: Colon Scope [id number] Procedure:             Colonoscopy Indications:           Screening for colorectal malignant neoplasm, This is                         the patient's first colonoscopy Providers:             Corinn Jess Brooklyn MD, MD Referring MD:          Lynwood FALCON. Valora, MD (Referring MD) Medicines:             General Anesthesia Complications:         No immediate complications. Estimated blood loss: None. Procedure:             Pre-Anesthesia Assessment:                        - Prior to the procedure, a History and Physical was                         performed, and patient medications and allergies were                         reviewed. The patient is competent. The risks and                         benefits of the procedure and the sedation options and                         risks were discussed with the patient. All questions                         were answered and informed consent was obtained.                         Patient identification and proposed procedure were                         verified by the physician, the nurse, the                         anesthesiologist, the anesthetist and the technician                         in the pre-procedure area in the procedure room in the                         endoscopy suite. Mental Status Examination: alert and                         oriented. Airway Examination: normal oropharyngeal                         airway and neck mobility. Respiratory Examination:  clear to auscultation. CV Examination: normal.                         Prophylactic Antibiotics: The patient does not require                          prophylactic antibiotics. Prior Anticoagulants: The                         patient has taken no anticoagulant or antiplatelet                         agents. ASA Grade Assessment: II - A patient with mild                         systemic disease. After reviewing the risks and                         benefits, the patient was deemed in satisfactory                         condition to undergo the procedure. The anesthesia                         plan was to use general anesthesia. Immediately prior                         to administration of medications, the patient was                         re-assessed for adequacy to receive sedatives. The                         heart rate, respiratory rate, oxygen saturations,                         blood pressure, adequacy of pulmonary ventilation, and                         response to care were monitored throughout the                         procedure. The physical status of the patient was                         re-assessed after the procedure.                        After obtaining informed consent, the colonoscope was                         passed under direct vision. Throughout the procedure,                         the patient's blood pressure, pulse, and oxygen                         saturations were monitored continuously. The  Colonoscope was introduced through the anus and                         advanced to the the cecum, identified by appendiceal                         orifice and ileocecal valve. The colonoscopy was                         performed without difficulty. The patient tolerated                         the procedure well. The quality of the bowel                         preparation was evaluated using the BBPS [facility] Bowel                         Preparation Scale) with scores of: Right Colon = 3,                         Transverse Colon = 3 and Left Colon = 3 (entire mucosa                          seen well with no residual staining, small fragments                         of stool or opaque liquid). The total BBPS score                         equals 9. The ileocecal valve, appendiceal orifice,                         and rectum were photographed. Findings:      The perianal and digital rectal examinations were normal. Pertinent       negatives include normal sphincter tone and no palpable rectal lesions.      Two sessile polyps were found in the descending colon. The polyps were       diminutive in size. These polyps were removed with a jumbo cold forceps.       Resection and retrieval were complete. Estimated blood loss: none.      Two sessile polyps were found in the sigmoid colon and descending colon.       The polyps were 3 to 4 mm in size. These polyps were removed with a cold       snare. Resection and retrieval were complete. Estimated blood loss: none.      The retroflexed view of the distal rectum and anal verge was normal and       showed no anal or rectal abnormalities. Impression:            - Two diminutive polyps in the descending colon,                         removed with a jumbo cold forceps. Resected and                         retrieved.                        -  Two 3 to 4 mm polyps in the sigmoid colon and in the                         descending colon, removed with a cold snare. Resected                         and retrieved.                        - The distal rectum and anal verge are normal on                         retroflexion view. Recommendation:        - Discharge patient to home (with escort).                        - Resume previous diet today.                        - Continue present medications.                        - Await pathology results.                        - Repeat colonoscopy in 5-10 years for surveillance                         based on pathology results. Procedure Code(s):     --- Professional ---                         [phone], Colonoscopy, flexible; with removal of                         tumor(s), polyp(s), or other lesion(s) by snare                         technique                        45380, 59, Colonoscopy, flexible; with biopsy, single                         or multiple Diagnosis Code(s):     --- Professional ---                        Z12.11, Encounter for screening for malignant neoplasm                         of colon                        D12.5, Benign neoplasm of sigmoid colon                        D12.4, Benign neoplasm of descending colon CPT copyright 2022 American Medical Association. All rights reserved. The codes documented in this report are preliminary and upon coder review may  be revised to meet current compliance requirements. Dr. Corinn Brooklyn Corinn Jess Brooklyn MD, MD 10/22/2024 9:20:06  AM This report has been signed electronically. Number of Addenda: 0 Note Initiated On: 10/22/2024 8:41 AM Scope Withdrawal Time: 0 hours 11 minutes 23 seconds  Total Procedure Duration: 0 hours 14 minutes 9 seconds  Estimated Blood Loss:  Estimated blood loss: none.      [facility]

## 2024-10-22 NOTE — Anesthesia Postprocedure Evaluation (Signed)
"   Anesthesia Post Note  Patient: Ehren Berisha  Procedure(s) Performed: COLONOSCOPY POLYPECTOMY, INTESTINE  Patient location during evaluation: PACU Anesthesia Type: General Level of consciousness: awake and alert Pain management: pain level controlled Vital Signs Assessment: post-procedure vital signs reviewed and stable Respiratory status: spontaneous breathing, nonlabored ventilation, respiratory function stable and patient connected to nasal cannula oxygen Cardiovascular status: blood pressure returned to baseline and stable Postop Assessment: no apparent nausea or vomiting Anesthetic complications: no   No notable events documented.   Last Vitals:  Vitals:   10/22/24 0922 10/22/24 0932  BP: (!) 92/58 108/71  Pulse: 85 85  Resp: 16 15  Temp: (!) 36 C   SpO2: 95% 97%    Last Pain:  Vitals:   10/22/24 0932  TempSrc:   PainSc: 0-No pain                 Lynwood KANDICE Clause      "

## 2024-10-23 LAB — SURGICAL PATHOLOGY

## 2024-10-24 ENCOUNTER — Ambulatory Visit: Payer: Self-pay | Admitting: Gastroenterology
